# Patient Record
Sex: Female | Born: 1960 | Race: White | Hispanic: No | Smoking: Former smoker
Health system: Southern US, Community
[De-identification: ages and names within clinical notes are randomized; demographics above are authoritative.]

## PROBLEM LIST (undated history)

## (undated) DIAGNOSIS — I671 Cerebral aneurysm, nonruptured: Secondary | ICD-10-CM

## (undated) DIAGNOSIS — I10 Essential (primary) hypertension: Secondary | ICD-10-CM

## (undated) DIAGNOSIS — IMO0002 Reserved for concepts with insufficient information to code with codable children: Secondary | ICD-10-CM

## (undated) DIAGNOSIS — K589 Irritable bowel syndrome without diarrhea: Secondary | ICD-10-CM

## (undated) DIAGNOSIS — Z8719 Personal history of other diseases of the digestive system: Secondary | ICD-10-CM

## (undated) DIAGNOSIS — K219 Gastro-esophageal reflux disease without esophagitis: Secondary | ICD-10-CM

## (undated) DIAGNOSIS — D699 Hemorrhagic condition, unspecified: Secondary | ICD-10-CM

## (undated) DIAGNOSIS — J349 Unspecified disorder of nose and nasal sinuses: Secondary | ICD-10-CM

## (undated) DIAGNOSIS — E7800 Pure hypercholesterolemia, unspecified: Secondary | ICD-10-CM

## (undated) HISTORY — DX: Essential (primary) hypertension: I10

## (undated) HISTORY — DX: Pure hypercholesterolemia, unspecified: E78.00

## (undated) HISTORY — PX: APPENDECTOMY: SHX54

## (undated) HISTORY — PX: SHOULDER ARTHROSCOPY: SHX128

## (undated) HISTORY — PX: REFRACTIVE SURGERY: SHX103

## (undated) HISTORY — PX: DIAGNOSTIC LAPAROSCOPY: SUR761

## (undated) HISTORY — DX: Cerebral aneurysm, nonruptured: I67.1

## (undated) HISTORY — PX: AUGMENTATION MAMMAPLASTY: SUR837

## (undated) HISTORY — PX: BREAST ENHANCEMENT SURGERY: SHX7

---

## 1997-11-14 DIAGNOSIS — I671 Cerebral aneurysm, nonruptured: Secondary | ICD-10-CM

## 1997-11-14 HISTORY — PX: CEREBRAL ANEURYSM REPAIR: SHX164

## 1997-11-14 HISTORY — DX: Cerebral aneurysm, nonruptured: I67.1

## 2003-11-15 HISTORY — PX: ABDOMINAL HYSTERECTOMY: SHX81

## 2009-01-21 ENCOUNTER — Emergency Department (HOSPITAL_COMMUNITY): Admission: EM | Admit: 2009-01-21 | Discharge: 2009-01-21 | Payer: Self-pay | Admitting: Family Medicine

## 2009-09-25 ENCOUNTER — Ambulatory Visit (HOSPITAL_COMMUNITY): Admission: RE | Admit: 2009-09-25 | Discharge: 2009-09-25 | Payer: Self-pay | Admitting: Internal Medicine

## 2010-02-24 ENCOUNTER — Encounter: Admission: RE | Admit: 2010-02-24 | Discharge: 2010-05-25 | Payer: Self-pay | Admitting: Neurological Surgery

## 2010-04-20 ENCOUNTER — Emergency Department (HOSPITAL_COMMUNITY): Admission: EM | Admit: 2010-04-20 | Discharge: 2010-04-20 | Payer: Self-pay | Admitting: Family Medicine

## 2010-06-03 ENCOUNTER — Inpatient Hospital Stay (HOSPITAL_COMMUNITY): Admission: RE | Admit: 2010-06-03 | Discharge: 2010-06-06 | Payer: Self-pay | Admitting: Neurological Surgery

## 2010-06-03 HISTORY — PX: LUMBAR DISC SURGERY: SHX700

## 2010-10-18 ENCOUNTER — Emergency Department (HOSPITAL_COMMUNITY)
Admission: EM | Admit: 2010-10-18 | Discharge: 2010-10-18 | Payer: Self-pay | Source: Home / Self Care | Admitting: Family Medicine

## 2011-01-25 LAB — POCT URINALYSIS DIPSTICK: Nitrite: POSITIVE — AB

## 2011-01-29 LAB — ABO/RH: ABO/RH(D): O POS

## 2011-01-29 LAB — BASIC METABOLIC PANEL
BUN: 16 mg/dL (ref 6–23)
Calcium: 9.9 mg/dL (ref 8.4–10.5)
Chloride: 100 mEq/L (ref 96–112)
GFR calc non Af Amer: >60 mL/min (ref >60)
Glucose, Bld: 96 mg/dL (ref 70–99)
Potassium: 4.6 mEq/L (ref 3.5–5.1)

## 2011-01-29 LAB — CBC
HCT: 38.7 % (ref 36.0–46.0)
Hemoglobin: 13.2 g/dL (ref 12.0–15.0)
RBC: 4.01 MIL/uL (ref 3.87–5.11)
RDW: 12.9 % (ref 11.5–15.5)
WBC: 5.8 10*3/uL (ref 4.0–10.5)

## 2011-01-29 LAB — TYPE AND SCREEN

## 2011-02-24 LAB — POCT URINALYSIS DIP (DEVICE)
Glucose, UA: NEGATIVE mg/dL
Ketones, ur: NEGATIVE mg/dL
Nitrite: POSITIVE — AB
Specific Gravity, Urine: 1.02 (ref 1.005–1.030)

## 2011-07-07 IMAGING — RF DG LUMBAR SPINE 2-3V
1 series · 2 of 2 positions shown · non-contrast
Comparison: MRI lumbar spine 09/25/2009

CLINICAL DATA: Back pain

LUMBAR SPINE - 2-3 VIEW

[Series 1: run · 2 of 2 slices shown]
[im 1/2]
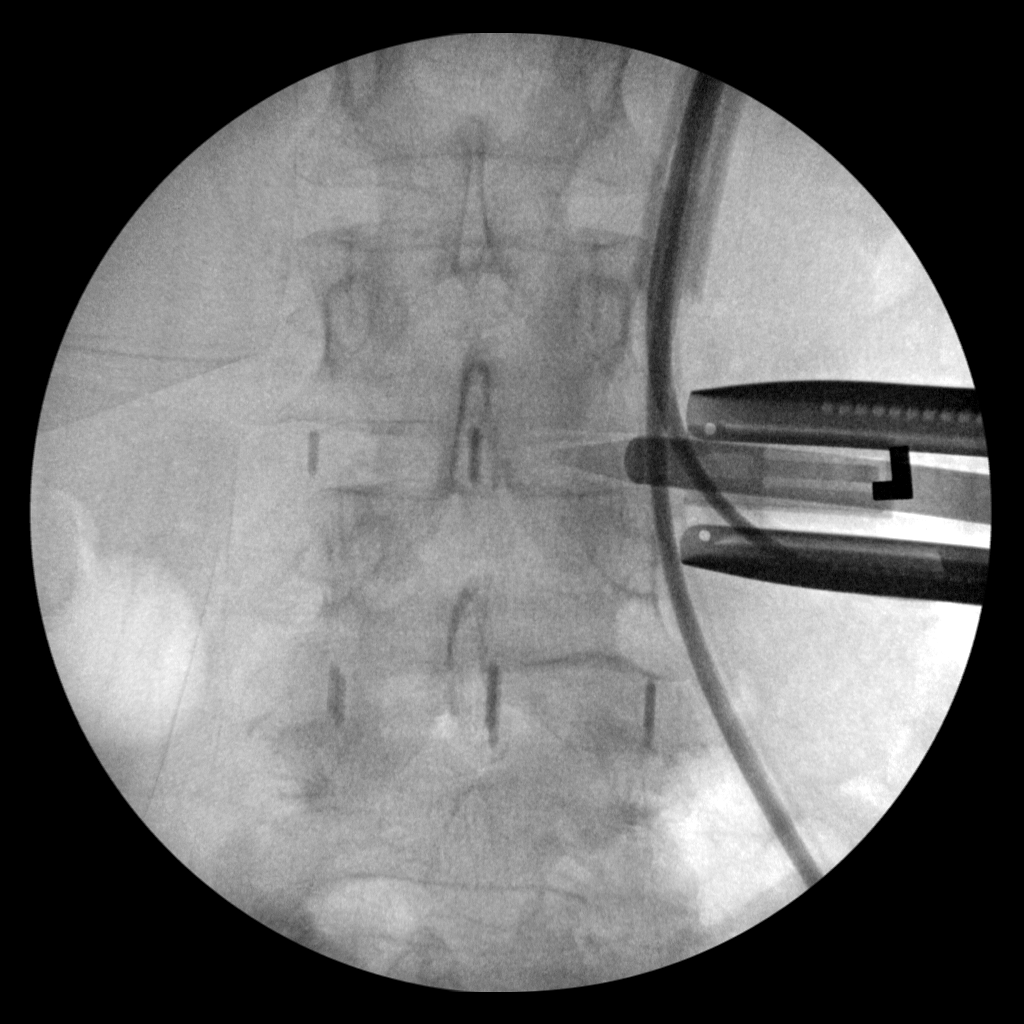
[im 2/2]
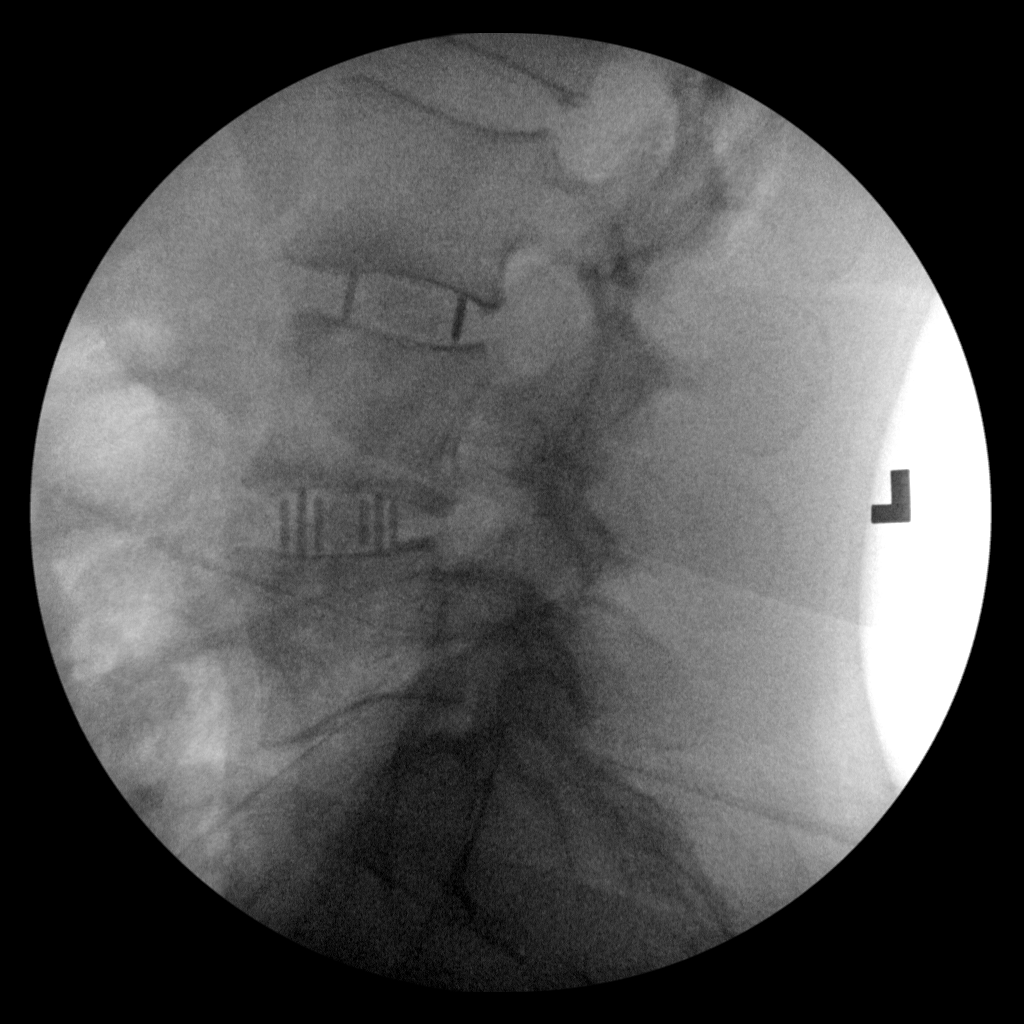

[2 of 2 positions shown; findings below may reference images not displayed]

FINDINGS: Intraoperative C-arm films document performance of XLIF
from a left lateral approach at L3-4 and L4-5.  Alignment appears
anatomic.
IMPRESSION: As above.

## 2011-11-15 HISTORY — PX: NASAL SINUS SURGERY: SHX719

## 2012-01-10 ENCOUNTER — Encounter: Payer: Self-pay | Admitting: Cardiovascular Disease

## 2012-01-10 ENCOUNTER — Ambulatory Visit (INDEPENDENT_AMBULATORY_CARE_PROVIDER_SITE_OTHER): Payer: 59 | Admitting: Cardiovascular Disease

## 2012-01-10 VITALS — BP 112/82 | HR 69 | Ht 69.0 in | Wt 142.0 lb

## 2012-01-10 DIAGNOSIS — I10 Essential (primary) hypertension: Secondary | ICD-10-CM | POA: Insufficient documentation

## 2012-01-10 DIAGNOSIS — R0789 Other chest pain: Secondary | ICD-10-CM

## 2012-01-10 NOTE — Assessment & Plan Note (Signed)
The patient has episodic chest pressure concerning for angina. There are typical and atypical features present. It sounds like her cardiac enzymes were equivocal when she was hospitalized. She clearly warrants stress testing and a nuclear stress study will be scheduled. She cannot exercise on the treadmill because of her recent operation. She will be scheduled for a Lexiscan Myoview study. Her underlying cardiovascular risk factors include hypertension and hyperlipidemia.

## 2012-01-10 NOTE — Assessment & Plan Note (Signed)
Controlled on current medical therapy.

## 2012-01-10 NOTE — Patient Instructions (Signed)
Your physician has requested that you have a lexiscan myoview. For further information please visit [email or web address]. Please follow instruction sheet, as given.  Your physician recommends that you schedule a follow-up appointment as needed.   Your physician recommends that you continue on your current medications as directed. Please refer to the Current Medication list given to you today.

## 2012-01-10 NOTE — Progress Notes (Signed)
HPI:  This is a 51 year old woman presenting for her initial cardiac evaluation. 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  She underwent sinus surgery about 2 weeks ago initially did very well with surgery. Last week she had a heated argument with her 79 year old daughter who lives with her. She woke up the following morning at 4 AM with severe substernal chest pressure and the episode lasted about one and a half hours. This felt like a pressure and squeezing sensation in the substernal chest radiating into the upper chest. There were no associated symptoms of nausea, diaphoresis, or breathlessness. Symptoms resolved on their own. The following day she was explaining the argument to her mother then had a recurrence of chest tightness. This was less severe than the initial episode but again lasted several minutes. She subsequently developed acute epistaxis and had to undergo a second surgery for bleeding from her ethmoid sinus. Following the surgery when she was going from the PACU into her room she developed chest pain again. The symptoms felt similar to her previous episodes. Cardiac markers apparently showed an elevated CK-MB but her troponin was reportedly normal. Cardiology was contacted and they recommended close outpatient followup. The patient reported another episode of mild chest pain earlier today but this was much less severe than the previous episodes. She has no personal history of cardiac problems in the past.  Her past medical history is pertinent for hypercholesterolemia, hypertension, a cerebral aneurysm requiring coiling in the 1990s, and hysterectomy in 2005.   Outpatient Encounter Prescriptions as of 01/10/2012  Medication Sig Dispense Refill  . atenolol (TENORMIN) 50 MG tablet Take 50 mg by mouth at bedtime.      . B Complex Vitamins (VITAMIN-B COMPLEX PO) Take by mouth daily.      . Cinnamon 500 MG capsule Take 500 mg by mouth 2 (two) times daily.      .  ferrous fumarate (HEMOCYTE - 106 MG FE) 325 (106 FE) MG TABS Take 1 tablet by mouth daily.      Marland Kitchen glycopyrrolate (ROBINUL) 2 MG tablet Take 2 mg by mouth 2 (two) times daily.      . hydrochlorothiazide (HYDRODIURIL) 25 MG tablet Take 25 mg by mouth daily.      Marland Kitchen KRILL OIL PO Take by mouth daily.      . Multiple Vitamins-Minerals (CENTRUM CARDIO PO) Take by mouth 2 (two) times daily.      . pantoprazole (PROTONIX) 40 MG tablet Take 40 mg by mouth daily.      . Probiotic Product (CVS PROBIOTIC PO) Take by mouth daily.      . quinapril (ACCUPRIL) 40 MG tablet Take 40 mg by mouth daily.        Morphine and related and Tetracyclines & related  Past Medical History  Diagnosis Date  . Cerebral aneurysm 1999    Surgically coiled  . Hypertension   . Hypercholesterolemia     Past Surgical History  Procedure Date  . Abdominal hysterectomy  2005  . Nasal sinus surgery 2013    History   Social History  . Marital Status: Divorced    Spouse Name: N/A    Number of Children: N/A  . Years of Education: N/A   Occupational History  . Not on file.   Social History Main Topics  . Smoking status: Former Smoker    Quit date: 11/14/1984  . Smokeless tobacco: Not on file  . Alcohol Use: Not on file  .  Drug Use: Not on file  . Sexually Active: Not on file   Other Topics Concern  . Not on file   Social History Narrative   The patient works as a Marine scientist in the electrophysiology department. Her daughter and 14-monthold granddaughter live with her. She does not smoke cigarettes.    Family History  Problem Relation Age of Onset  . Hypertension Father   . Hypertension Mother     ROS: General: no fevers/chills/night sweats Eyes: no blurry vision, diplopia, or amaurosis ENT: Positive for pain behind her left ear, eye, and jaw area Resp: no cough, wheezing, or hemoptysis CV: no edema or palpitations GI: no abdominal pain, vomiting, diarrhea, or constipation. Positive for nausea GU: no  dysuria, frequency, or hematuria Skin: no rash Neuro: no headache, numbness, tingling, or weakness of extremities Musculoskeletal: no joint pain or swelling Heme: no bleeding, DVT, or easy bruising Endo: no polydipsia or polyuria  BP 112/82  Pulse 69  Ht 5' 9"  (1.753 m)  Wt 64.411 kg (142 lb)  BMI 20.97 kg/m2  PHYSICAL EXAM: Pt is alert and oriented, WD, WN, in no distress. HEENT: normal Neck: JVP normal. Carotid upstrokes normal without bruits. No thyromegaly. Lungs: equal expansion, clear bilaterally CV: Apex is discrete and nondisplaced, RRR without murmur or gallop Abd: soft, NT, +BS, no bruit, no hepatosplenomegaly Back: no CVA tenderness Ext: no C/C/E        Femoral pulses 2+= without bruits        DP/PT pulses intact and = Skin: warm and dry without rash Neuro: CNII-XII intact             Strength intact = bilaterally  EKG:  Normal sinus rhythm with nonspecific ST abnormality and incomplete right bundle branch block.  ASSESSMENT AND PLAN:

## 2012-01-11 ENCOUNTER — Ambulatory Visit (HOSPITAL_COMMUNITY): Payer: 59 | Attending: Cardiology | Admitting: Radiology

## 2012-01-11 VITALS — BP 99/61 | Ht 69.0 in | Wt 139.0 lb

## 2012-01-11 DIAGNOSIS — Z87891 Personal history of nicotine dependence: Secondary | ICD-10-CM | POA: Insufficient documentation

## 2012-01-11 DIAGNOSIS — R110 Nausea: Secondary | ICD-10-CM | POA: Insufficient documentation

## 2012-01-11 DIAGNOSIS — R079 Chest pain, unspecified: Secondary | ICD-10-CM | POA: Insufficient documentation

## 2012-01-11 DIAGNOSIS — R0789 Other chest pain: Secondary | ICD-10-CM

## 2012-01-11 DIAGNOSIS — E785 Hyperlipidemia, unspecified: Secondary | ICD-10-CM | POA: Insufficient documentation

## 2012-01-11 DIAGNOSIS — I10 Essential (primary) hypertension: Secondary | ICD-10-CM | POA: Insufficient documentation

## 2012-01-11 MED ORDER — TECHNETIUM TC 99M TETROFOSMIN IV KIT
10.0000 | PACK | Freq: Once | INTRAVENOUS | Status: AC | PRN
  Administered 2012-01-11: 10 via INTRAVENOUS

## 2012-01-11 MED ORDER — REGADENOSON 0.4 MG/5ML IV SOLN
0.4000 mg | Freq: Once | INTRAVENOUS | Status: AC
  Administered 2012-01-11: 0.4 mg via INTRAVENOUS

## 2012-01-11 MED ORDER — TECHNETIUM TC 99M TETROFOSMIN IV KIT
30.0000 | PACK | Freq: Once | INTRAVENOUS | Status: AC | PRN
  Administered 2012-01-11: 30 via INTRAVENOUS

## 2012-01-11 NOTE — Progress Notes (Signed)
[facility] NUCLEAR MED [place] [address] [phone]  Cardiology Nuclear Med Study  [name] [name] is a 51 y.o. female [id number] [birth year]   Nuclear Med Background Indication for Stress Test:  Evaluation for Ischemia and [facility]: Chest pain after sinus surgery> ^CK-MB, NL Tropoin History:  GXT: [facility] Cardiology Cardiac Risk Factors: History of Smoking, Hypertension and Lipids  Symptoms:  Chest Pain, Chest Pressure, and Nausea   Nuclear Pre-Procedure Caffeine/Decaff Intake:  None NPO After: 7:00pm   Lungs:  clear IV 0.9% NS with Angio Cath:  20g  IV Site: R Antecubital  IV Started by:  Eliezer Lofts, EMT-P  Chest Size (in):  36 Cup Size: D  Height: 5' 9"  (1.753 m)  Weight:  139 lb (63.05 kg)  BMI:  Body mass index is 20.53 kg/(m^2). Tech Comments:  NA    Nuclear Med Study 1 or 2 day study: 1 day  Stress Test Type:  Treadmill/Lexiscan  Reading MD: Kirk Ruths, MD  Order Authorizing Provider:  M.Cooper  Resting Radionuclide: Technetium 56mTetrofosmin  Resting Radionuclide Dose: 10.8 mCi   Stress Radionuclide:  Technetium 953metrofosmin  Stress Radionuclide Dose: 32.7 mCi           Stress Protocol Rest HR: 88 Stress HR: 121  Rest BP: 99/61 Stress BP: 112/67  Exercise Time (min): n/a METS: n/a   Predicted Max HR: 168 bpm % Max HR: 72.02 bpm Rate Pressure Product: 13552   Dose of Adenosine (mg):  n/a Dose of Lexiscan: 0.4 mg  Dose of Atropine (mg): n/a Dose of Dobutamine: n/a mcg/kg/min (at max HR)  Stress Test Technologist: SaPerrin MalteseEMT-P  Nuclear Technologist:  ToAnnye RuskCNMT     Rest Procedure:  Myocardial perfusion imaging was performed at rest 45 minutes following the intravenous administration of Technetium 9961mtrofosmin. Rest ECG: NSR with non-specific ST-T wave changes  Stress Procedure:  The patient received IV Lexiscan 0.4 mg over 15-seconds with concurrent low level exercise and then  Technetium 28m60mrofosmin was injected at 30-seconds while the patient continued walking one more minute.  There were non specific changes, + sob, chest tightness, and lt. headed with Lexiscan.  Quantitative spect images were obtained after a 45-minute delay. Stress ECG: No significant ST segment change suggestive of ischemia.  QPS Raw Data Images:  Acquisition technically good; normal left ventricular size. Stress Images:  Normal homogeneous uptake in all areas of the myocardium. Rest Images:  Normal homogeneous uptake in all areas of the myocardium. Subtraction (SDS):  No evidence of ischemia. Transient Ischemic Dilatation (Normal <1.22):  1.01 Lung/Heart Ratio (Normal <0.45):  0.32  Quantitative Gated Spect Images QGS EDV:  62 ml QGS ESV:  13 ml QGS cine images:  NL LV Function; NL Wall Motion QGS EF: 80%  Impression Exercise Capacity:  Lexiscan with no exercise. BP Response:  Normal blood pressure response. Clinical Symptoms:  There is chest tightness. ECG Impression:  No significant ST segment change suggestive of ischemia. Comparison with Prior Nuclear Study: No previous nuclear study performed  Overall Impression:  Normal stress nuclear study.  BriaKirk Ruths

## 2012-02-27 ENCOUNTER — Encounter (HOSPITAL_BASED_OUTPATIENT_CLINIC_OR_DEPARTMENT_OTHER): Payer: Self-pay | Admitting: *Deleted

## 2012-02-27 NOTE — Progress Notes (Signed)
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

## 2012-03-01 ENCOUNTER — Ambulatory Visit (HOSPITAL_COMMUNITY): Payer: 59

## 2012-03-01 ENCOUNTER — Encounter (HOSPITAL_BASED_OUTPATIENT_CLINIC_OR_DEPARTMENT_OTHER): Payer: Self-pay

## 2012-03-01 ENCOUNTER — Ambulatory Visit (HOSPITAL_BASED_OUTPATIENT_CLINIC_OR_DEPARTMENT_OTHER): Payer: 59 | Admitting: *Deleted

## 2012-03-01 ENCOUNTER — Ambulatory Visit (HOSPITAL_BASED_OUTPATIENT_CLINIC_OR_DEPARTMENT_OTHER)
Admission: RE | Admit: 2012-03-01 | Discharge: 2012-03-01 | Disposition: A | Discharge status: Home / Self Care | Payer: 59 | Source: Ambulatory Visit | Attending: Orthopedic Surgery | Admitting: Orthopedic Surgery

## 2012-03-01 ENCOUNTER — Encounter (HOSPITAL_BASED_OUTPATIENT_CLINIC_OR_DEPARTMENT_OTHER): Payer: Self-pay | Admitting: *Deleted

## 2012-03-01 ENCOUNTER — Encounter (HOSPITAL_BASED_OUTPATIENT_CLINIC_OR_DEPARTMENT_OTHER)
Admission: RE | Disposition: A | Discharge status: Home / Self Care | Payer: Self-pay | Source: Ambulatory Visit | Attending: Orthopedic Surgery

## 2012-03-01 DIAGNOSIS — IMO0001 Reserved for inherently not codable concepts without codable children: Secondary | ICD-10-CM

## 2012-03-01 DIAGNOSIS — W108XXA Fall (on) (from) other stairs and steps, initial encounter: Secondary | ICD-10-CM | POA: Insufficient documentation

## 2012-03-01 DIAGNOSIS — E7800 Pure hypercholesterolemia, unspecified: Secondary | ICD-10-CM | POA: Insufficient documentation

## 2012-03-01 DIAGNOSIS — S92253A Displaced fracture of navicular [scaphoid] of unspecified foot, initial encounter for closed fracture: Secondary | ICD-10-CM | POA: Insufficient documentation

## 2012-03-01 DIAGNOSIS — I10 Essential (primary) hypertension: Secondary | ICD-10-CM | POA: Insufficient documentation

## 2012-03-01 DIAGNOSIS — K219 Gastro-esophageal reflux disease without esophagitis: Secondary | ICD-10-CM | POA: Insufficient documentation

## 2012-03-01 HISTORY — DX: Unspecified disorder of nose and nasal sinuses: J34.9

## 2012-03-01 HISTORY — DX: Reserved for concepts with insufficient information to code with codable children: IMO0002

## 2012-03-01 HISTORY — DX: Gastro-esophageal reflux disease without esophagitis: K21.9

## 2012-03-01 HISTORY — PX: ORIF ANKLE FRACTURE: SHX5408

## 2012-03-01 LAB — POCT I-STAT, CHEM 8
Calcium, Ion: 1.17 mmol/L (ref 1.12–1.32)
Creatinine, Ser: 0.6 mg/dL (ref 0.50–1.10)
Glucose, Bld: 104 mg/dL — ABNORMAL HIGH (ref 70–99)
Hemoglobin: 12.2 g/dL (ref 12.0–15.0)
Sodium: 140 mEq/L (ref 135–145)
TCO2: 29 mmol/L (ref 0–100)

## 2012-03-01 SURGERY — OPEN REDUCTION INTERNAL FIXATION (ORIF) ANKLE FRACTURE
Anesthesia: General | Site: Foot | Laterality: Right | Wound class: Clean

## 2012-03-01 MED ORDER — FENTANYL CITRATE 0.05 MG/ML IJ SOLN
25.0000 ug | INTRAMUSCULAR | Status: DC | PRN
  Administered 2012-03-01 (×2): 25 ug via INTRAVENOUS

## 2012-03-01 MED ORDER — OXYCODONE HCL 5 MG PO TABS
5.0000 mg | ORAL_TABLET | Freq: Once | ORAL | Status: AC
  Administered 2012-03-01: 5 mg via ORAL

## 2012-03-01 MED ORDER — MIDAZOLAM HCL 2 MG/2ML IJ SOLN
0.5000 mg | INTRAMUSCULAR | Status: DC | PRN
  Administered 2012-03-01 (×2): 2 mg via INTRAVENOUS

## 2012-03-01 MED ORDER — LIDOCAINE HCL 1 % IJ SOLN
INTRAMUSCULAR | Status: DC | PRN
Start: 2012-03-01 — End: 2012-03-01
  Administered 2012-03-01: 2 mL via INTRADERMAL

## 2012-03-01 MED ORDER — METOCLOPRAMIDE HCL 5 MG/ML IJ SOLN
INTRAMUSCULAR | Status: DC | PRN
  Administered 2012-03-01: 10 mg via INTRAVENOUS

## 2012-03-01 MED ORDER — ONDANSETRON HCL 4 MG/2ML IJ SOLN
INTRAMUSCULAR | Status: DC | PRN
  Administered 2012-03-01: 4 mg via INTRAVENOUS

## 2012-03-01 MED ORDER — METOCLOPRAMIDE HCL 5 MG/ML IJ SOLN: 10.0000 mg | Freq: Once | INTRAMUSCULAR | Status: DC | PRN

## 2012-03-01 MED ORDER — CHLORHEXIDINE GLUCONATE 4 % EX LIQD: 60.0000 mL | Freq: Once | CUTANEOUS | Status: DC

## 2012-03-01 MED ORDER — PROPOFOL 10 MG/ML IV EMUL
INTRAVENOUS | Status: DC | PRN
  Administered 2012-03-01: 250 mg via INTRAVENOUS

## 2012-03-01 MED ORDER — LACTATED RINGERS IV SOLN
INTRAVENOUS | Status: DC
  Administered 2012-03-01 (×2): via INTRAVENOUS

## 2012-03-01 MED ORDER — LIDOCAINE-EPINEPHRINE 1.5-1:200000 % IJ SOLN
INTRAMUSCULAR | Status: DC | PRN
  Administered 2012-03-01: 25 mL via INTRADERMAL

## 2012-03-01 MED ORDER — DEXAMETHASONE SODIUM PHOSPHATE 10 MG/ML IJ SOLN
INTRAMUSCULAR | Status: DC | PRN
  Administered 2012-03-01: 10 mg via INTRAVENOUS

## 2012-03-01 MED ORDER — 0.9 % SODIUM CHLORIDE (POUR BTL) OPTIME
TOPICAL | Status: DC | PRN
  Administered 2012-03-01: 1000 mL

## 2012-03-01 MED ORDER — OXYCODONE HCL 5 MG PO TABS: 5.0000 mg | ORAL_TABLET | ORAL | Status: AC | PRN

## 2012-03-01 MED ORDER — CEFAZOLIN SODIUM-DEXTROSE 2-3 GM-% IV SOLR
2.0000 g | INTRAVENOUS | Status: AC
  Administered 2012-03-01: 2 g via INTRAVENOUS

## 2012-03-01 MED ORDER — ROPIVACAINE HCL 5 MG/ML IJ SOLN
INTRAMUSCULAR | Status: DC | PRN
  Administered 2012-03-01: 25 mL via EPIDURAL

## 2012-03-01 MED ORDER — FENTANYL CITRATE 0.05 MG/ML IJ SOLN
50.0000 ug | INTRAMUSCULAR | Status: DC | PRN
  Administered 2012-03-01: 100 ug via INTRAVENOUS

## 2012-03-01 SURGICAL SUPPLY — 65 items
BAG DECANTER FOR FLEXI CONT (MISCELLANEOUS) IMPLANT
BANDAGE ESMARK 6X9 LF (GAUZE/BANDAGES/DRESSINGS) ×1 IMPLANT
BIT DRILL 2 FAST STEP (BIT) ×2 IMPLANT
BLADE SURG 15 STRL LF DISP TIS (BLADE) ×2 IMPLANT
BLADE SURG 15 STRL SS (BLADE) ×2
BNDG COHESIVE 4X5 TAN STRL (GAUZE/BANDAGES/DRESSINGS) ×2 IMPLANT
BNDG COHESIVE 6X5 TAN STRL LF (GAUZE/BANDAGES/DRESSINGS) ×2 IMPLANT
BNDG ESMARK 6X9 LF (GAUZE/BANDAGES/DRESSINGS) ×2
CHLORAPREP W/TINT 26ML (MISCELLANEOUS) ×2 IMPLANT
CLOTH BEACON ORANGE TIMEOUT ST (SAFETY) ×2 IMPLANT
COVER TABLE BACK 60X90 (DRAPES) ×2 IMPLANT
CUFF TOURNIQUET SINGLE 34IN LL (TOURNIQUET CUFF) ×2 IMPLANT
DECANTER SPIKE VIAL GLASS SM (MISCELLANEOUS) IMPLANT
DRAPE C-ARM 42X72 X-RAY (DRAPES) ×2 IMPLANT
DRAPE EXTREMITY T 121X128X90 (DRAPE) ×2 IMPLANT
DRAPE INCISE IOBAN 66X45 STRL (DRAPES) ×2 IMPLANT
DRAPE U-SHAPE 47X51 STRL (DRAPES) ×2 IMPLANT
DRAPE U-SHAPE 76X120 STRL (DRAPES) ×2 IMPLANT
DRESSING ADAPTIC 1/2  N-ADH (PACKING) IMPLANT
DRSG EMULSION OIL 3X3 NADH (GAUZE/BANDAGES/DRESSINGS) ×2 IMPLANT
DRSG PAD ABDOMINAL 8X10 ST (GAUZE/BANDAGES/DRESSINGS) ×4 IMPLANT
ELECT REM PT RETURN 9FT ADLT (ELECTROSURGICAL) ×2
ELECTRODE REM PT RTRN 9FT ADLT (ELECTROSURGICAL) ×1 IMPLANT
GLOVE BIO SURGEON STRL SZ8 (GLOVE) ×2 IMPLANT
GLOVE BIOGEL PI IND STRL 8 (GLOVE) ×1 IMPLANT
GLOVE BIOGEL PI INDICATOR 8 (GLOVE) ×1
GLOVE ECLIPSE 6.5 STRL STRAW (GLOVE) ×2 IMPLANT
GOWN PREVENTION PLUS XLARGE (GOWN DISPOSABLE) ×2 IMPLANT
GOWN PREVENTION PLUS XXLARGE (GOWN DISPOSABLE) ×2 IMPLANT
K-WIRE ACE 1.6X6 (WIRE) ×4
KWIRE ACE 1.6X6 (WIRE) ×2 IMPLANT
NEEDLE HYPO 22GX1.5 SAFETY (NEEDLE) IMPLANT
PACK BASIN DAY SURGERY FS (CUSTOM PROCEDURE TRAY) ×2 IMPLANT
PAD CAST 4YDX4 CTTN HI CHSV (CAST SUPPLIES) ×1 IMPLANT
PADDING CAST COTTON 4X4 STRL (CAST SUPPLIES) ×1
PADDING CAST COTTON 6X4 STRL (CAST SUPPLIES) ×2 IMPLANT
PENCIL BUTTON HOLSTER BLD 10FT (ELECTRODE) ×2 IMPLANT
PLATE LOCK SM RT NAVICULAR FT (Plate) ×2 IMPLANT
SCREW CANC NONLOCK 2.5X26 (Screw) ×2 IMPLANT
SCREW DRIVER TIP-BIOMET ×2 IMPLANT
SCREW PEG 2.5X14 NONLOCK (Screw) ×4 IMPLANT
SCREW PEG 2.5X20 NONLOCK (Screw) ×2 IMPLANT
SCREW PEG 2.5X22 NONLOCK (Screw) ×2 IMPLANT
SCREW PEG 2.5X24 NONLOCK (Screw) ×6 IMPLANT
SHEET MEDIUM DRAPE 40X70 STRL (DRAPES) ×2 IMPLANT
SPLINT FAST PLASTER 5X30 (CAST SUPPLIES) ×20
SPLINT PLASTER CAST FAST 5X30 (CAST SUPPLIES) ×20 IMPLANT
SPONGE GAUZE 4X4 12PLY (GAUZE/BANDAGES/DRESSINGS) ×2 IMPLANT
SPONGE LAP 18X18 X RAY DECT (DISPOSABLE) ×2 IMPLANT
STAPLER VISISTAT 35W (STAPLE) IMPLANT
STOCKINETTE 6  STRL (DRAPES) ×1
STOCKINETTE 6 STRL (DRAPES) ×1 IMPLANT
STRIP CLOSURE SKIN 1/2X4 (GAUZE/BANDAGES/DRESSINGS) IMPLANT
SUCTION FRAZIER TIP 10 FR DISP (SUCTIONS) IMPLANT
SUT MNCRL AB 3-0 PS2 18 (SUTURE) ×2 IMPLANT
SUT PROLENE 3 0 PS 2 (SUTURE) ×2 IMPLANT
SUT VIC AB 0 SH 27 (SUTURE) ×2 IMPLANT
SUT VIC AB 2-0 SH 27 (SUTURE)
SUT VIC AB 2-0 SH 27XBRD (SUTURE) IMPLANT
SUT VICRYL 4-0 PS2 18IN ABS (SUTURE) IMPLANT
SYR BULB 3OZ (MISCELLANEOUS) ×2 IMPLANT
SYR CONTROL 10ML LL (SYRINGE) IMPLANT
TUBE CONNECTING 20X1/4 (TUBING) IMPLANT
UNDERPAD 30X30 INCONTINENT (UNDERPADS AND DIAPERS) ×2 IMPLANT
WATER STERILE IRR 1000ML POUR (IV SOLUTION) IMPLANT

## 2012-03-01 NOTE — Progress Notes (Signed)
Assisted Dr. Albertina Parr with right, ultrasound guided, popliteal/saphenous block. Side rails up, monitors on throughout procedure. See vital signs in flow sheet. Tolerated Procedure well.

## 2012-03-01 NOTE — Op Note (Signed)
03/01/2012  11:06 AM  PATIENT:  [name] [name]  51 y.o. female  PRE-OPERATIVE DIAGNOSIS:  Right navicular fracture  POST-OPERATIVE DIAGNOSIS:  Right navicular fracture  Procedure(s): OPEN REDUCTION INTERNAL FIXATION (ORIF) ANKLE FRACTURE 2.  Fluoro  SURGEON:  Wylene Simmer, MD  ASSISTANT: n/a  ANESTHESIA:   General, regional  EBL:  minimal   TOURNIQUET:   Total Tourniquet Time Documented: Thigh (Right) - 71 minutes  COMPLICATIONS:  None apparent  DISPOSITION:  Extubated, awake and stable to recovery.  DICTATION ID:  [id number]

## 2012-03-01 NOTE — Anesthesia Postprocedure Evaluation (Signed)
Anesthesia Post Note  Patient: [name] [name]  Procedure(s) Performed: Procedure(s) (LRB): OPEN REDUCTION INTERNAL FIXATION (ORIF) ANKLE FRACTURE (Right)  Anesthesia type: General  Patient location: PACU  Post pain: Pain level controlled  Post assessment: Patient's Cardiovascular Status Stable  Last Vitals:  Filed Vitals:   03/01/12 1230  BP: 94/57  Pulse: 58  Temp:   Resp: 12    Post vital signs: Reviewed and stable  Level of consciousness: alert  Complications: No apparent anesthesia complications

## 2012-03-01 NOTE — Transfer of Care (Signed)
Immediate Anesthesia Transfer of Care Note  Patient: [name] [name]  Procedure(s) Performed: Procedure(s) (LRB): OPEN REDUCTION INTERNAL FIXATION (ORIF) ANKLE FRACTURE (Right)  Patient Location: PACU  Anesthesia Type: GA combined with regional for post-op pain  Level of Consciousness: awake, alert  and oriented  Airway & Oxygen Therapy: Patient Spontanous Breathing and Patient connected to face mask oxygen  Post-op Assessment: Report given to PACU RN, Post -op Vital signs reviewed and stable and Patient moving all extremities  Post vital signs: Reviewed and stable  Complications: No apparent anesthesia complications

## 2012-03-01 NOTE — H&P (Signed)
[name] [name] is an 51 y.o. female.   Chief Complaint: navicular fracture HPI: 51 y/o female with right ankle sprain and navicular fracture after recent fall.  She presents now for ORIF of this displaced intra-articular injury.  She has no h/o diabetes, recent smoking or thyroid problems.  Past Medical History  Diagnosis Date  . Cerebral aneurysm 1999    Surgically coiled  . Hypertension   . Hypercholesterolemia   . Sinus disease   . GERD (gastroesophageal reflux disease)   . DDD (degenerative disc disease)     Past Surgical History  Procedure Date  . Nasal sinus surgery 2013  . Back surgery 2010    scoliosis-lumbar  . Back surgery 2010    disc fusion  . Appendectomy   . Cerebral aneurysm repair 1999    coiled  . Eye surgery     lasik  . Shoulder arthroscopy     lt  . Abdominal hysterectomy  2005    abd  . Breast enhancement surgery   . Diagnostic laparoscopy     Family History  Problem Relation Age of Onset  . Hypertension Father   . Hypertension Mother    Social History:  reports that she quit smoking about 27 years ago. She does not have any smokeless tobacco history on file. Her alcohol and drug histories not on file.  Allergies:  Allergies  Allergen Reactions  . Morphine And Related     Nausea and vomitting  . Tetracyclines & Related     Break out on all extremties    No current facility-administered medications on file as of .   Medications Prior to Admission  Medication Sig Dispense Refill  . atenolol (TENORMIN) 50 MG tablet Take 50 mg by mouth at bedtime.      . B Complex Vitamins (VITAMIN-B COMPLEX PO) Take by mouth daily.      . Cinnamon 500 MG capsule Take 500 mg by mouth 2 (two) times daily.      . ferrous fumarate (HEMOCYTE - 106 MG FE) 325 (106 FE) MG TABS Take 1 tablet by mouth daily.      Marland Kitchen glycopyrrolate (ROBINUL) 2 MG tablet Take 2 mg by mouth 2 (two) times daily.      . hydrochlorothiazide (HYDRODIURIL) 25 MG tablet Take 25 mg by mouth  daily.      Marland Kitchen KRILL OIL PO Take by mouth daily.      . Multiple Vitamins-Minerals (CENTRUM CARDIO PO) Take by mouth 2 (two) times daily.      . pantoprazole (PROTONIX) 40 MG tablet Take 40 mg by mouth daily.      . Probiotic Product (CVS PROBIOTIC PO) Take by mouth daily.      . quinapril (ACCUPRIL) 40 MG tablet Take 40 mg by mouth daily.        No results found for this or any previous visit (from the past 48 hour(s)). No results found.  ROS  No recent f/c/n/v/wt loss.  There were no vitals taken for this visit. Physical Exam wn wd female in nad.  A and O x 4.  Mood and affect normal.  EOMI.  Respirations unlabored.  L foot with healthy skin.  Pulses palpable.  Normal sens to LT.  Active 5/5 strength inPF and DF of ankle.  No lymphadenopathy.  xrays show a displaced dorsal medial navicular fracture.  Assessment/Plan R foot displaced intra-articular navicular fracture - to OR for ORIF.  The risks and benefits of the alternative  treatment options have been discussed in detail.  The patient wishes to proceed with surgery and specifically understands risks of bleeding, infection, nerve damage, blood clots, need for additional surgery, amputation and death.   Wylene Simmer Mar 26, 2012, 7:16 AM

## 2012-03-01 NOTE — Discharge Instructions (Addendum)
Wylene Simmer, MD [place]  Please read the following information regarding your care after surgery.  Medications  You only need a prescription for the narcotic pain medicine (ex. oxycodone, Percocet, Norco).  All of the other medicines listed below are available over the counter. X acetominophen (Tylenol) 650 mg every 4-6 hours as you need for minor pain X oxycodone as prescribed for moderate to severe pain X over the counter benadryl as needed for itching.  Narcotic pain medicine (ex. oxycodone, Percocet, Vicodin) will cause constipation.  To prevent this problem, take the following medicines while you are taking any pain medicine. X docusate sodium (Colace) 100 mg twice a day X senna (Senokot) 2 tablets twice a day  X To help prevent blood clots, take an aspirin (325 mg) once a day for a month after surgery.  You should also get up every hour while you are awake to move around.    Weight Bearing ? Bear weight when you are able on your operated leg or foot. ? Bear weight only on the heel of your operated foot in the post-op shoe. X Do not bear any weight on the operated leg or foot.  Cast / Splint / Dressing X Keep your splint or cast clean and dry.  Don't put anything (coat hanger, pencil, etc) down inside of it.  If it gets damp, use a hair dryer on the cool setting to dry it.  If it gets soaked, call the office to schedule an appointment for a cast change. ? Remove your dressing 3 days after surgery and cover the incisions with dry dressings.    After your dressing, cast or splint is removed; you may shower, but do not soak or scrub the wound.  Allow the water to run over it, and then gently pat it dry.  Swelling It is normal for you to have swelling where you had surgery.  To reduce swelling and pain, keep your toes above your nose for at least 3 days after surgery.  It may be necessary to keep your foot or leg elevated for several weeks.  If it hurts, it should be  elevated.  Follow Up Call my office at [phone] when you are discharged from the hospital or surgery center to schedule an appointment to be seen two weeks after surgery.  Call my office at [phone] if you develop a fever >101.5 F, nausea, vomiting, bleeding from the surgical site or severe pain.     Post Anesthesia Home Care Instructions  Activity: Get plenty of rest for the remainder of the day. A responsible adult should stay with you for 24 hours following the procedure.  For the next 24 hours, DO NOT: -Drive a car -Paediatric nurse -Drink alcoholic beverages -Take any medication unless instructed by your physician -Make any legal decisions or sign important papers.  Meals: Start with liquid foods such as gelatin or soup. Progress to regular foods as tolerated. Avoid greasy, spicy, heavy foods. If nausea and/or vomiting occur, drink only clear liquids until the nausea and/or vomiting subsides. Call your physician if vomiting continues.  Special Instructions/Symptoms: Your throat may feel dry or sore from the anesthesia or the breathing tube placed in your throat during surgery. If this causes discomfort, gargle with warm salt water. The discomfort should disappear within 24 hours.    Regional Anesthesia Blocks  1. Numbness or the inability to move the "blocked" extremity may last from 3-48 hours after placement. The length of time depends on the  medication injected and your individual response to the medication. If the numbness is not going away after 48 hours, call your surgeon.  2. The extremity that is blocked will need to be protected until the numbness is gone and the  Strength has returned. Because you cannot feel it, you will need to take extra care to avoid injury. Because it may be weak, you may have difficulty moving it or using it. You may not know what position it is in without looking at it while the block is in effect.  3. For blocks in the legs and feet,  returning to weight bearing and walking needs to be done carefully. You will need to wait until the numbness is entirely gone and the strength has returned. You should be able to move your leg and foot normally before you try and bear weight or walk. You will need someone to be with you when you first try to ensure you do not fall and possibly risk injury.  4. Bruising and tenderness at the needle site are common side effects and will resolve in a few days.  5. Persistent numbness or new problems with movement should be communicated to the surgeon or the [facility] [phone] [place] [phone]).

## 2012-03-01 NOTE — Anesthesia Procedure Notes (Addendum)
Anesthesia Regional Block:  Popliteal block  Pre-Anesthetic Checklist: ,, timeout performed, Correct Patient, Correct Site, Correct Laterality, Correct Procedure, Correct Position, site marked, Risks and benefits discussed,  Surgical consent,  Pre-op evaluation,  At surgeon's request and post-op pain management  Laterality: Right  Prep: chloraprep       Needles:   Needle Type: Other   (Arrow Echogenic)   Needle Length: 9cm  Needle Gauge: 21    Additional Needles:  Procedures: ultrasound guided Popliteal block Narrative:  Start time: 03/01/2012 8:39 AM End time: 03/01/2012 8:48 AM Injection made incrementally with aspirations every 5 mL.  Performed by: Personally  Anesthesiologist: Nada Libman, MD  Additional Notes: Ultrasound guidance used to: id relevant anatomy, confirm needle position, local anesthetic spread, avoidance of vascular puncture. Picture saved. No complications. Block performed personally by Jessy Oto. Albertina Parr, MD  .    Popliteal block Procedure Name: LMA Insertion Date/Time: 03/01/2012 9:37 AM Performed by: [place] Pre-anesthesia Checklist: Patient identified, Emergency Drugs available, Suction available and Patient being monitored Patient Re-evaluated:Patient Re-evaluated prior to inductionOxygen Delivery Method: Circle System Utilized Preoxygenation: Pre-oxygenation with 100% oxygen Intubation Type: IV induction Ventilation: Mask ventilation without difficulty LMA: LMA inserted LMA Size: 4.0 Number of attempts: 1 Airway Equipment and Method: bite block Placement Confirmation: positive ETCO2 and breath sounds checked- equal and bilateral Tube secured with: Tape Dental Injury: Teeth and Oropharynx as per pre-operative assessment

## 2012-03-01 NOTE — Anesthesia Preprocedure Evaluation (Addendum)
Anesthesia Evaluation  Patient identified by MRN, date of birth, ID band Patient awake    Reviewed: Allergy & Precautions, H&P , NPO status , Patient's Chart, lab work & pertinent test results, reviewed documented beta blocker date and time   Airway Mallampati: II TM Distance: >3 FB Neck ROM: full    Dental   Pulmonary neg pulmonary ROS,          Cardiovascular hypertension, On Medications and On Home Beta Blockers     Neuro/Psych negative neurological ROS  negative psych ROS   GI/Hepatic Neg liver ROS, GERD-  Medicated and Controlled,  Endo/Other  negative endocrine ROS  Renal/GU negative Renal ROS  negative genitourinary   Musculoskeletal   Abdominal   Peds  Hematology negative hematology ROS (+)   Anesthesia Other Findings See surgeon's H&P   Reproductive/Obstetrics negative OB ROS                           Anesthesia Physical Anesthesia Plan  ASA: II  Anesthesia Plan: General   Post-op Pain Management:    Induction: Intravenous  Airway Management Planned: LMA  Additional Equipment:   Intra-op Plan:   Post-operative Plan: Extubation in OR  Informed Consent: I have reviewed the patients History and Physical, chart, labs and discussed the procedure including the risks, benefits and alternatives for the proposed anesthesia with the patient or authorized representative who has indicated his/her understanding and acceptance.     Plan Discussed with: CRNA and Surgeon  Anesthesia Plan Comments:         Anesthesia Quick Evaluation

## 2012-03-02 NOTE — Op Note (Signed)
NAME:  [name] [name]                      ACCOUNT NO.:  MEDICAL RECORD NO.:  [id number]  LOCATION:                                 FACILITY:  PHYSICIAN:  Wylene Simmer, MD        DATE OF BIRTH:  24-Sep-1961  DATE OF PROCEDURE:  03/01/2012 DATE OF DISCHARGE:                              OPERATIVE REPORT   PREOPERATIVE DIAGNOSIS:  Right navicular fracture.  POSTOPERATIVE DIAGNOSIS:  Right navicular fracture.  PROCEDURE: 1. Open reduction and internal fixation of right navicular fracture. 2. Intraoperative interpretation of fluoroscopic imaging.  SURGEON:  Wylene Simmer, M.D.  ANESTHESIA:  General, regional.  ESTIMATED BLOOD LOSS:  Minimal.  TOURNIQUET TIME:  71 minutes, at 225 mmHg.  COMPLICATIONS:  None apparent.  DISPOSITION:  Extubated, awake, and stable to recovery.  INDICATIONS FOR PROCEDURE:  The patient is a 51 year old woman who suffered an injury to her right ankle when she fell down some stairs at home.  X-rays revealed an intra-articular displaced fracture of the navicular.  She presents now for operative treatment of this injury. She understands risks and benefits, alternative treatment options and elects surgical treatment.  She specifically understands the risks of bleeding, infection, nerve damage, blood clots, need for additional surgery, amputation, and death.  PROCEDURE IN DETAIL:  After preoperative consent was obtained and the correct operative site was identified, the patient was brought to the operating room and placed supine on the operating table.  General anesthesia was induced.  Preoperative antibiotics were administered. Surgical time-out was taken.  Right lower extremity was prepped and draped in standard sterile fashion with a tourniquet around the thigh. The extremity was exsanguinated and tourniquet was inflated to 225 mmHg. A dorsal incision was made over the navicular.  The interval between the tibialis anterior and extensor hallus longus was  developed and the neurovascular bundle was identified and mobilized, it was retracted laterally and protected throughout the case.  The fracture site was identified.  The fracture was opened and cleaned of all hematoma.  The fracture was reduced and pinned into position.  AP and lateral views showed appropriate reduction of the fracture.  A Biomet dorsal navicular plate was selected and then applied to the dorsum of the navicular.  It was contoured to fit appropriately and it was secured with multiple nonlocking screws compressing the fracture site and pulling the plate securely down to the bone.  All of the unused FAST Guide's were removed. AP and lateral views of the foot showed appropriate reduction of the fracture, appropriate position and length of all hardware.  The wound was irrigated copiously.  The talonavicular joint capsule was closed over the joint with simple sutures of 0 Vicryl.  The extensor hallucis longus and extensor retinaculum was repaired with simple sutures of 0 Vicryl.  Subcutaneous tissue was closed with inverted simple sutures of 3-0 Monocryl.  Running 3-0 Prolene suture was used to close the skin incision.  Sterile dressings were applied, followed by a well-padded short-leg splint.  The patient was awakened from anesthesia and transported to the recovery room in stable condition.  FOLLOWUP PLAN:  The patient will be nonweightbearing  on her right foot. She will follow up with me in 2 weeks for suture removal and conversion to a cast.  She will begin aspirin twice a day for DVT prophylaxis.     Wylene Simmer, MD     JH/MEDQ  D:  03/01/2012  T:  03/01/2012  Job:  [id number]

## 2012-03-07 ENCOUNTER — Encounter (HOSPITAL_BASED_OUTPATIENT_CLINIC_OR_DEPARTMENT_OTHER): Payer: Self-pay | Admitting: Orthopedic Surgery

## 2012-03-08 ENCOUNTER — Other Ambulatory Visit: Payer: Self-pay | Admitting: Obstetrics and Gynecology

## 2012-03-08 DIAGNOSIS — N649 Disorder of breast, unspecified: Secondary | ICD-10-CM

## 2012-05-24 ENCOUNTER — Other Ambulatory Visit: Payer: 59

## 2013-04-22 ENCOUNTER — Other Ambulatory Visit: Payer: Self-pay | Admitting: Internal Medicine

## 2013-04-22 DIAGNOSIS — Z87898 Personal history of other specified conditions: Secondary | ICD-10-CM

## 2013-05-14 ENCOUNTER — Ambulatory Visit
Admission: RE | Admit: 2013-05-14 | Discharge: 2013-05-14 | Disposition: A | Discharge status: Home / Self Care | Payer: 59 | Source: Ambulatory Visit | Attending: Internal Medicine | Admitting: Internal Medicine

## 2013-05-14 DIAGNOSIS — Z87898 Personal history of other specified conditions: Secondary | ICD-10-CM

## 2014-05-22 ENCOUNTER — Other Ambulatory Visit: Payer: Self-pay

## 2014-05-22 DIAGNOSIS — Z1231 Encounter for screening mammogram for malignant neoplasm of breast: Secondary | ICD-10-CM

## 2014-06-04 ENCOUNTER — Ambulatory Visit
Admission: RE | Admit: 2014-06-04 | Discharge: 2014-06-04 | Disposition: A | Discharge status: Home / Self Care | Payer: 59 | Source: Ambulatory Visit

## 2014-06-04 ENCOUNTER — Encounter (INDEPENDENT_AMBULATORY_CARE_PROVIDER_SITE_OTHER): Payer: Self-pay

## 2014-06-04 ENCOUNTER — Other Ambulatory Visit: Payer: Self-pay

## 2014-06-04 DIAGNOSIS — Z1231 Encounter for screening mammogram for malignant neoplasm of breast: Secondary | ICD-10-CM

## 2015-05-26 ENCOUNTER — Other Ambulatory Visit: Payer: Self-pay

## 2015-05-26 DIAGNOSIS — Z1231 Encounter for screening mammogram for malignant neoplasm of breast: Secondary | ICD-10-CM

## 2015-07-01 ENCOUNTER — Ambulatory Visit
Admission: RE | Admit: 2015-07-01 | Discharge: 2015-07-01 | Disposition: A | Discharge status: Home / Self Care | Payer: 59 | Source: Ambulatory Visit

## 2015-07-01 DIAGNOSIS — Z1231 Encounter for screening mammogram for malignant neoplasm of breast: Secondary | ICD-10-CM

## 2015-11-17 MED FILL — HYDROCHLOROTHIAZIDE 25 MG T: 25 | 90 days supply | Qty: 90 | Fill #2

## 2015-11-17 MED FILL — ATENOLOL 50 MG TABLET: 50 | 90 days supply | Qty: 90 | Fill #2

## 2015-11-17 MED FILL — PANTOPRAZOLE SOD DR 40 MG T: 40 | 90 days supply | Qty: 90 | Fill #2

## 2015-11-17 MED FILL — valACYclovir HCL 1 GM TABS: 1 | 90 days supply | Qty: 90 | Fill #2

## 2015-11-17 MED FILL — QUINAPRIL 40 MG TABLET: 40 | 90 days supply | Qty: 90 | Fill #2

## 2015-12-04 ENCOUNTER — Encounter (HOSPITAL_BASED_OUTPATIENT_CLINIC_OR_DEPARTMENT_OTHER): Payer: Self-pay | Admitting: *Deleted

## 2015-12-07 ENCOUNTER — Encounter (HOSPITAL_BASED_OUTPATIENT_CLINIC_OR_DEPARTMENT_OTHER): Payer: Self-pay | Admitting: *Deleted

## 2015-12-09 ENCOUNTER — Other Ambulatory Visit: Payer: Self-pay | Admitting: Orthopedic Surgery

## 2015-12-10 ENCOUNTER — Encounter (HOSPITAL_BASED_OUTPATIENT_CLINIC_OR_DEPARTMENT_OTHER)
Admission: RE | Admit: 2015-12-10 | Discharge: 2015-12-10 | Disposition: A | Discharge status: Home / Self Care | Payer: 59 | Source: Ambulatory Visit | Attending: Orthopedic Surgery | Admitting: Orthopedic Surgery

## 2015-12-10 DIAGNOSIS — M24031 Loose body in right wrist: Secondary | ICD-10-CM | POA: Diagnosis not present

## 2015-12-10 DIAGNOSIS — Z87891 Personal history of nicotine dependence: Secondary | ICD-10-CM | POA: Diagnosis not present

## 2015-12-10 DIAGNOSIS — E7800 Pure hypercholesterolemia, unspecified: Secondary | ICD-10-CM | POA: Diagnosis not present

## 2015-12-10 DIAGNOSIS — M6588 Other synovitis and tenosynovitis, other site: Secondary | ICD-10-CM | POA: Diagnosis not present

## 2015-12-10 DIAGNOSIS — M19031 Primary osteoarthritis, right wrist: Secondary | ICD-10-CM | POA: Diagnosis not present

## 2015-12-10 DIAGNOSIS — K219 Gastro-esophageal reflux disease without esophagitis: Secondary | ICD-10-CM | POA: Diagnosis not present

## 2015-12-10 DIAGNOSIS — I10 Essential (primary) hypertension: Secondary | ICD-10-CM | POA: Diagnosis not present

## 2015-12-10 LAB — BASIC METABOLIC PANEL
ANION GAP: 13 (ref 5–15)
BUN: 21 mg/dL — ABNORMAL HIGH (ref 6–20)
CHLORIDE: 95 mmol/L — AB (ref 101–111)
CO2: 32 mmol/L (ref 22–32)
Calcium: 10.3 mg/dL (ref 8.9–10.3)
Creatinine, Ser: 0.6 mg/dL (ref 0.44–1.00)
GFR calc Af Amer: >60 mL/min (ref >60)
GLUCOSE: 126 mg/dL — AB (ref 65–99)
POTASSIUM: 4.4 mmol/L (ref 3.5–5.1)
Sodium: 140 mmol/L (ref 135–145)

## 2015-12-11 ENCOUNTER — Ambulatory Visit (HOSPITAL_BASED_OUTPATIENT_CLINIC_OR_DEPARTMENT_OTHER): Payer: 59 | Admitting: Certified Registered"

## 2015-12-11 ENCOUNTER — Encounter (HOSPITAL_BASED_OUTPATIENT_CLINIC_OR_DEPARTMENT_OTHER): Payer: Self-pay | Admitting: Certified Registered"

## 2015-12-11 ENCOUNTER — Ambulatory Visit (HOSPITAL_BASED_OUTPATIENT_CLINIC_OR_DEPARTMENT_OTHER)
Admission: RE | Admit: 2015-12-11 | Discharge: 2015-12-11 | Disposition: A | Discharge status: Home / Self Care | Payer: 59 | Source: Ambulatory Visit | Attending: Orthopedic Surgery | Admitting: Orthopedic Surgery

## 2015-12-11 ENCOUNTER — Encounter (HOSPITAL_BASED_OUTPATIENT_CLINIC_OR_DEPARTMENT_OTHER)
Admission: RE | Disposition: A | Discharge status: Home / Self Care | Payer: Self-pay | Source: Ambulatory Visit | Attending: Orthopedic Surgery

## 2015-12-11 DIAGNOSIS — Z87891 Personal history of nicotine dependence: Secondary | ICD-10-CM | POA: Diagnosis not present

## 2015-12-11 DIAGNOSIS — E7800 Pure hypercholesterolemia, unspecified: Secondary | ICD-10-CM | POA: Diagnosis not present

## 2015-12-11 DIAGNOSIS — G8918 Other acute postprocedural pain: Secondary | ICD-10-CM | POA: Diagnosis not present

## 2015-12-11 DIAGNOSIS — I10 Essential (primary) hypertension: Secondary | ICD-10-CM | POA: Diagnosis not present

## 2015-12-11 DIAGNOSIS — M19031 Primary osteoarthritis, right wrist: Secondary | ICD-10-CM | POA: Insufficient documentation

## 2015-12-11 DIAGNOSIS — K219 Gastro-esophageal reflux disease without esophagitis: Secondary | ICD-10-CM | POA: Insufficient documentation

## 2015-12-11 DIAGNOSIS — M6588 Other synovitis and tenosynovitis, other site: Secondary | ICD-10-CM | POA: Diagnosis not present

## 2015-12-11 DIAGNOSIS — M25531 Pain in right wrist: Secondary | ICD-10-CM | POA: Diagnosis not present

## 2015-12-11 DIAGNOSIS — M65841 Other synovitis and tenosynovitis, right hand: Secondary | ICD-10-CM | POA: Diagnosis not present

## 2015-12-11 DIAGNOSIS — M659 Synovitis and tenosynovitis, unspecified: Secondary | ICD-10-CM | POA: Diagnosis not present

## 2015-12-11 DIAGNOSIS — M24031 Loose body in right wrist: Secondary | ICD-10-CM | POA: Insufficient documentation

## 2015-12-11 HISTORY — PX: ARTHROTOMY: SHX5728

## 2015-12-11 HISTORY — DX: Irritable bowel syndrome, unspecified: K58.9

## 2015-12-11 HISTORY — PX: SYNOVECTOMY: SHX5180

## 2015-12-11 HISTORY — PX: WRIST ARTHROSCOPY: SHX838

## 2015-12-11 HISTORY — DX: Personal history of other diseases of the digestive system: Z87.19

## 2015-12-11 SURGERY — ARTHROSCOPY, WRIST
Anesthesia: General | Site: Wrist | Laterality: Right

## 2015-12-11 MED ORDER — PROMETHAZINE HCL 25 MG/ML IJ SOLN: 6.2500 mg | INTRAMUSCULAR | Status: DC | PRN

## 2015-12-11 MED ORDER — BUPIVACAINE HCL (PF) 0.5 % IJ SOLN
INTRAMUSCULAR | Status: DC | PRN
  Administered 2015-12-11: 25 mL via PERINEURAL

## 2015-12-11 MED ORDER — TAPENTADOL HCL 50 MG PO TABS: 100.0000 mg | ORAL_TABLET | ORAL | Status: DC | PRN

## 2015-12-11 MED ORDER — SCOPOLAMINE 1 MG/3DAYS TD PT72: 1.0000 | MEDICATED_PATCH | Freq: Once | TRANSDERMAL | Status: DC | PRN

## 2015-12-11 MED ORDER — HYDROMORPHONE HCL 1 MG/ML IJ SOLN: 0.2500 mg | INTRAMUSCULAR | Status: DC | PRN

## 2015-12-11 MED ORDER — DEXAMETHASONE SODIUM PHOSPHATE 10 MG/ML IJ SOLN
INTRAMUSCULAR | Status: AC
  Filled 2015-12-11: qty 1

## 2015-12-11 MED ORDER — SUCCINYLCHOLINE CHLORIDE 20 MG/ML IJ SOLN
INTRAMUSCULAR | Status: AC
  Filled 2015-12-11: qty 1

## 2015-12-11 MED ORDER — MIDAZOLAM HCL 2 MG/2ML IJ SOLN
1.0000 mg | INTRAMUSCULAR | Status: DC | PRN
  Administered 2015-12-11 (×2): 2 mg via INTRAVENOUS

## 2015-12-11 MED ORDER — PROPOFOL 500 MG/50ML IV EMUL
INTRAVENOUS | Status: AC
  Filled 2015-12-11: qty 50

## 2015-12-11 MED ORDER — ONDANSETRON HCL 4 MG/2ML IJ SOLN
INTRAMUSCULAR | Status: DC | PRN
  Administered 2015-12-11: 4 mg via INTRAVENOUS

## 2015-12-11 MED ORDER — CEFAZOLIN SODIUM-DEXTROSE 2-3 GM-% IV SOLR
INTRAVENOUS | Status: AC
  Filled 2015-12-11: qty 50

## 2015-12-11 MED ORDER — BUPIVACAINE HCL (PF) 0.25 % IJ SOLN
INTRAMUSCULAR | Status: AC
  Filled 2015-12-11: qty 60

## 2015-12-11 MED ORDER — ONDANSETRON HCL 4 MG/2ML IJ SOLN
INTRAMUSCULAR | Status: AC
  Filled 2015-12-11: qty 2

## 2015-12-11 MED ORDER — LIDOCAINE HCL (CARDIAC) 20 MG/ML IV SOLN
INTRAVENOUS | Status: AC
  Filled 2015-12-11: qty 5

## 2015-12-11 MED ORDER — MIDAZOLAM HCL 2 MG/2ML IJ SOLN
INTRAMUSCULAR | Status: AC
  Filled 2015-12-11: qty 2

## 2015-12-11 MED ORDER — BUPIVACAINE HCL (PF) 0.5 % IJ SOLN
INTRAMUSCULAR | Status: AC
  Filled 2015-12-11: qty 60

## 2015-12-11 MED ORDER — LIDOCAINE-EPINEPHRINE (PF) 1.5 %-1:200000 IJ SOLN
INTRAMUSCULAR | Status: DC | PRN
  Administered 2015-12-11: 15 mL via PERINEURAL

## 2015-12-11 MED ORDER — CHLORHEXIDINE GLUCONATE 4 % EX LIQD: 60.0000 mL | Freq: Once | CUTANEOUS | Status: DC

## 2015-12-11 MED ORDER — LACTATED RINGERS IV SOLN
INTRAVENOUS | Status: DC
  Administered 2015-12-11 (×2): via INTRAVENOUS

## 2015-12-11 MED ORDER — FENTANYL CITRATE (PF) 100 MCG/2ML IJ SOLN
50.0000 ug | INTRAMUSCULAR | Status: DC | PRN
  Administered 2015-12-11: 100 ug via INTRAVENOUS

## 2015-12-11 MED ORDER — FENTANYL CITRATE (PF) 100 MCG/2ML IJ SOLN
INTRAMUSCULAR | Status: AC
  Filled 2015-12-11: qty 2

## 2015-12-11 MED ORDER — PROPOFOL 10 MG/ML IV BOLUS
INTRAVENOUS | Status: DC | PRN
  Administered 2015-12-11: 200 mg via INTRAVENOUS

## 2015-12-11 MED ORDER — GLYCOPYRROLATE 0.2 MG/ML IJ SOLN: 0.2000 mg | Freq: Once | INTRAMUSCULAR | Status: DC | PRN

## 2015-12-11 MED ORDER — LIDOCAINE HCL (CARDIAC) 20 MG/ML IV SOLN
INTRAVENOUS | Status: DC | PRN
  Administered 2015-12-11: 30 mg via INTRAVENOUS

## 2015-12-11 MED ORDER — CEFAZOLIN SODIUM-DEXTROSE 2-3 GM-% IV SOLR
2.0000 g | INTRAVENOUS | Status: AC
  Administered 2015-12-11: 2 g via INTRAVENOUS

## 2015-12-11 MED ORDER — DEXAMETHASONE SODIUM PHOSPHATE 10 MG/ML IJ SOLN
INTRAMUSCULAR | Status: DC | PRN
  Administered 2015-12-11: 10 mg via INTRAVENOUS

## 2015-12-11 MED FILL — NUCYNTA 50 MG TABLET: 50 | 4 days supply | Qty: 50 | Fill #0

## 2015-12-11 SURGICAL SUPPLY — 83 items
BANDAGE ACE 3X5.8 VEL STRL LF (GAUZE/BANDAGES/DRESSINGS) ×4 IMPLANT
BANDAGE ACE 4X5 VEL STRL LF (GAUZE/BANDAGES/DRESSINGS) ×2 IMPLANT
BANDAGE COBAN STERILE 2 (GAUZE/BANDAGES/DRESSINGS) ×2 IMPLANT
BLADE CUDA 2.0 (BLADE) IMPLANT
BLADE SURG 15 STRL LF DISP TIS (BLADE) ×2 IMPLANT
BLADE SURG 15 STRL SS (BLADE) ×2
BNDG COHESIVE 3X5 TAN STRL LF (GAUZE/BANDAGES/DRESSINGS) ×2 IMPLANT
BNDG CONFORM 3 STRL LF (GAUZE/BANDAGES/DRESSINGS) ×2 IMPLANT
BNDG GAUZE ELAST 4 BULKY (GAUZE/BANDAGES/DRESSINGS) ×2 IMPLANT
BRUSH SCRUB EZ PLAIN DRY (MISCELLANEOUS) ×2 IMPLANT
BUR FULL RADIUS 2.0 (BURR) ×2 IMPLANT
CORDS BIPOLAR (ELECTRODE) ×2 IMPLANT
COVER BACK TABLE 60X90IN (DRAPES) ×2 IMPLANT
CUFF TOURNIQUET SINGLE 18IN (TOURNIQUET CUFF) ×2 IMPLANT
DECANTER SPIKE VIAL GLASS SM (MISCELLANEOUS) IMPLANT
DRAPE EXTREMITY T 121X128X90 (DRAPE) ×2 IMPLANT
DRAPE OEC MINIVIEW 54X84 (DRAPES) ×2 IMPLANT
DRAPE SURG 17X23 STRL (DRAPES) ×2 IMPLANT
DRSG EMULSION OIL 3X3 NADH (GAUZE/BANDAGES/DRESSINGS) ×2 IMPLANT
FIBERSTICK 2 (SUTURE) IMPLANT
GAUZE SPONGE 4X4 12PLY STRL (GAUZE/BANDAGES/DRESSINGS) ×2 IMPLANT
GAUZE SPONGE 4X4 16PLY XRAY LF (GAUZE/BANDAGES/DRESSINGS) IMPLANT
GAUZE XEROFORM 1X8 LF (GAUZE/BANDAGES/DRESSINGS) IMPLANT
GAUZE XEROFORM 5X9 LF (GAUZE/BANDAGES/DRESSINGS) ×2 IMPLANT
GLOVE BIO SURGEON STRL SZ 6.5 (GLOVE) ×2 IMPLANT
GLOVE BIO SURGEON STRL SZ8 (GLOVE) ×2 IMPLANT
GLOVE BIOGEL M STRL SZ7.5 (GLOVE) IMPLANT
GLOVE BIOGEL PI IND STRL 7.0 (GLOVE) ×2 IMPLANT
GLOVE BIOGEL PI IND STRL 7.5 (GLOVE) ×1 IMPLANT
GLOVE BIOGEL PI INDICATOR 7.0 (GLOVE) ×2
GLOVE BIOGEL PI INDICATOR 7.5 (GLOVE) ×1
GLOVE SS BIOGEL STRL SZ 8 (GLOVE) ×1 IMPLANT
GLOVE SUPERSENSE BIOGEL SZ 8 (GLOVE) ×1
GLOVE SURG SS PI 8.0 STRL IVOR (GLOVE) ×2 IMPLANT
GOWN STRL REUS W/ TWL LRG LVL3 (GOWN DISPOSABLE) ×2 IMPLANT
GOWN STRL REUS W/TWL LRG LVL3 (GOWN DISPOSABLE) ×2
IV NS IRRIG 3000ML ARTHROMATIC (IV SOLUTION) IMPLANT
NDL SUT 6 .5 CRC .975X.05 MAYO (NEEDLE) IMPLANT
NEEDLE HYPO 22GX1.5 SAFETY (NEEDLE) ×2 IMPLANT
NEEDLE HYPO 25X1 1.5 SAFETY (NEEDLE) IMPLANT
NEEDLE MAYO 6 CRC TAPER PT (NEEDLE) IMPLANT
NEEDLE MAYO TAPER (NEEDLE)
NS IRRIG 1000ML POUR BTL (IV SOLUTION) ×2 IMPLANT
PACK BASIN DAY SURGERY FS (CUSTOM PROCEDURE TRAY) ×2 IMPLANT
PAD CAST 3X4 CTTN HI CHSV (CAST SUPPLIES) ×2 IMPLANT
PAD CAST 4YDX4 CTTN HI CHSV (CAST SUPPLIES) IMPLANT
PADDING CAST ABS 4INX4YD NS (CAST SUPPLIES) ×1
PADDING CAST ABS COTTON 4X4 ST (CAST SUPPLIES) ×1 IMPLANT
PADDING CAST COTTON 3X4 STRL (CAST SUPPLIES) ×2
PADDING CAST COTTON 4X4 STRL (CAST SUPPLIES)
PASSER SUT SWANSON 36MM LOOP (INSTRUMENTS) IMPLANT
SET SM JOINT TUBING/CANN (CANNULA) ×2 IMPLANT
SHEET MEDIUM DRAPE 40X70 STRL (DRAPES) ×2 IMPLANT
SLEEVE SCD COMPRESS KNEE MED (MISCELLANEOUS) ×2 IMPLANT
SLING ARM MED ADULT FOAM STRAP (SOFTGOODS) IMPLANT
SPLINT FAST PLASTER 5X30 (CAST SUPPLIES)
SPLINT FIBERGLASS 3X35 (CAST SUPPLIES) ×2 IMPLANT
SPLINT FIBERGLASS 4X30 (CAST SUPPLIES) IMPLANT
SPLINT PLASTER CAST FAST 5X30 (CAST SUPPLIES) IMPLANT
STOCKINETTE 4X48 STRL (DRAPES) ×2 IMPLANT
STOCKINETTE SYNTHETIC 3 UNSTER (CAST SUPPLIES) ×2 IMPLANT
STRIP CLOSURE SKIN 1/2X4 (GAUZE/BANDAGES/DRESSINGS) ×2 IMPLANT
SUT ETHIBOND 3-0 V-5 (SUTURE) IMPLANT
SUT FIBERWIRE 4-0 18 DIAM BLUE (SUTURE)
SUT PDS 2 CP NEEDLE XSPECIAL (SUTURE) IMPLANT
SUT PDS AB 2-0 CT2 27 (SUTURE) IMPLANT
SUT PROLENE 3 0 PS 2 (SUTURE) IMPLANT
SUT PROLENE 4 0 P 3 18 (SUTURE) IMPLANT
SUT PROLENE 4 0 PS 2 18 (SUTURE) ×4 IMPLANT
SUT VIC AB 3-0 FS2 27 (SUTURE) IMPLANT
SUTURE FIBERWR 4-0 18 DIA BLUE (SUTURE) IMPLANT
SYR BULB 3OZ (MISCELLANEOUS) ×2 IMPLANT
SYR CONTROL 10ML LL (SYRINGE) ×2 IMPLANT
TAPE SURG TRANSPORE 1 IN (GAUZE/BANDAGES/DRESSINGS) ×1 IMPLANT
TAPE SURGICAL TRANSPORE 1 IN (GAUZE/BANDAGES/DRESSINGS) ×1
TOWEL OR 17X24 6PK STRL BLUE (TOWEL DISPOSABLE) ×4 IMPLANT
TOWEL OR NON WOVEN STRL DISP B (DISPOSABLE) ×2 IMPLANT
TRAP DIGIT (INSTRUMENTS) IMPLANT
TRAP FINGER LRG (INSTRUMENTS) IMPLANT
TUBE CONNECTING 20X1/4 (TUBING) ×2 IMPLANT
UNDERPAD 30X30 (UNDERPADS AND DIAPERS) ×2 IMPLANT
WAND 1.5 MICROBLATOR (SURGICAL WAND) ×2 IMPLANT
WATER STERILE IRR 1000ML POUR (IV SOLUTION) ×2 IMPLANT

## 2015-12-11 NOTE — Op Note (Signed)
See [id number] Amedeo Plenty MD

## 2015-12-11 NOTE — Anesthesia Procedure Notes (Addendum)
Anesthesia Regional Block:  Supraclavicular block  Pre-Anesthetic Checklist: ,, timeout performed, Correct Patient, Correct Site, Correct Laterality, Correct Procedure, Correct Position, site marked, Risks and benefits discussed,  Surgical consent,  Pre-op evaluation,  At surgeon's request and post-op pain management  Laterality: Right  Prep: chloraprep       Needles:  Injection technique: Single-shot  Needle Type: Echogenic Stimulator Needle     Needle Length: 9cm 9 cm Needle Gauge: 21 and 21 G    Additional Needles:  Procedures: ultrasound guided (picture in chart) Supraclavicular block Narrative:  Injection made incrementally with aspirations every 5 mL.  Performed by: Personally  Anesthesiologist: ROSE, GEORGE  Additional Notes: Patient tolerated the procedure well without complications   Procedure Name: LMA Insertion Date/Time: 12/11/2015 7:49 AM Performed by: Avanthika Dehnert D Pre-anesthesia Checklist: Patient identified, Emergency Drugs available, Suction available and Patient being monitored Patient Re-evaluated:Patient Re-evaluated prior to inductionOxygen Delivery Method: Circle System Utilized Preoxygenation: Pre-oxygenation with 100% oxygen Intubation Type: IV induction Ventilation: Mask ventilation without difficulty LMA: LMA inserted LMA Size: 4.0 Number of attempts: 1 Airway Equipment and Method: Bite block Placement Confirmation: positive ETCO2 Tube secured with: Tape Dental Injury: Teeth and Oropharynx as per pre-operative assessment

## 2015-12-11 NOTE — Anesthesia Preprocedure Evaluation (Addendum)
Anesthesia Evaluation  Patient identified by MRN, date of birth, ID band Patient awake    Reviewed: Allergy & Precautions, NPO status , Patient's Chart, lab work & pertinent test results  Airway Mallampati: II  TM Distance: >3 FB Neck ROM: Full    Dental no notable dental hx.    Pulmonary neg pulmonary ROS, former smoker,    Pulmonary exam normal breath sounds clear to auscultation       Cardiovascular hypertension, Pt. on home beta blockers and Pt. on medications Normal cardiovascular exam Rhythm:Regular Rate:Normal     Neuro/Psych negative neurological ROS  negative psych ROS   GI/Hepatic Neg liver ROS, GERD  Medicated,  Endo/Other  negative endocrine ROS  Renal/GU negative Renal ROS  negative genitourinary   Musculoskeletal negative musculoskeletal ROS (+) Arthritis ,   Abdominal   Peds negative pediatric ROS (+)  Hematology negative hematology ROS (+)   Anesthesia Other Findings   Reproductive/Obstetrics negative OB ROS                            Anesthesia Physical Anesthesia Plan  ASA: II  Anesthesia Plan: General   Post-op Pain Management: GA combined w/ Regional for post-op pain   Induction: Intravenous  Airway Management Planned: LMA  Additional Equipment:   Intra-op Plan:   Post-operative Plan: Extubation in OR  Informed Consent: I have reviewed the patients History and Physical, chart, labs and discussed the procedure including the risks, benefits and alternatives for the proposed anesthesia with the patient or authorized representative who has indicated his/her understanding and acceptance.   Dental advisory given  Plan Discussed with: CRNA and Surgeon  Anesthesia Plan Comments:         Anesthesia Quick Evaluation

## 2015-12-11 NOTE — Transfer of Care (Signed)
Immediate Anesthesia Transfer of Care Note  Patient: [name] [name]  Procedure(s) Performed: Procedure(s): RIGHT WRIST ARTHROSCOPY WITH DEBRIDEMENT  (Right) OPEN ARTHROTOMY  (Right) SYNOVECTOMY DISTAL RADIOULNAR JOINT LOOSE BODY REMOVAL  (Right)  Patient Location: PACU  Anesthesia Type:GA combined with regional for post-op pain  Level of Consciousness: awake, alert , oriented and patient cooperative  Airway & Oxygen Therapy: Patient Spontanous Breathing and Patient connected to face mask oxygen  Post-op Assessment: Report given to RN and Post -op Vital signs reviewed and stable  Post vital signs: Reviewed and stable  Last Vitals:  Filed Vitals:   12/11/15 0715 12/11/15 0720  BP: 103/65   Pulse: 71 70  Temp:    Resp: 16 16    Complications: No apparent anesthesia complications

## 2015-12-11 NOTE — H&P (Signed)
[name] [name] is an 55 y.o. female.   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 She understands the plan of care. Just complains of ulnar wrist pain. Her examination shows loose body formation in the distal radial ulnar joint and arthritic change. Our goal is to remove loose bodies debrided the joint and try to give her better semblance of function. She has not demonstrated any excessive activity and her EC U tendon.  She is continued complaint of ulnar wrist pain we'll plan for a concomitant arthroscopy with debridement as necessary    She understands risk and benefits and desires to proceed.  Questions have been encouraged and answered  Past Medical History  Diagnosis Date  . Cerebral aneurysm 1999    Surgically coiled  . Hypertension   . Hypercholesterolemia   . Sinus disease   . GERD (gastroesophageal reflux disease)   . DDD (degenerative disc disease)   . Irritable bowel disease     under control  . History of Barrett's esophagus     Past Surgical History  Procedure Laterality Date  . Nasal sinus surgery  2013  . Appendectomy    . Cerebral aneurysm repair  1999    coiled  . Eye surgery      lasik  . Shoulder arthroscopy      lt  . Abdominal hysterectomy   2005    abd  . Breast enhancement surgery    . Diagnostic laparoscopy    . Orif ankle fracture  03/01/2012    Procedure: OPEN REDUCTION INTERNAL FIXATION (ORIF) ANKLE FRACTURE;  Surgeon: Wylene Simmer, MD;  Location: [place];  Service: Orthopedics;  Laterality: Right;  Open reduction internal fixation right navicular fracture  . Lumbar disc surgery Left 06/03/2010    anterolateral decompression and arthrodesis L3-4, L4-5  . Back surgery      lumbar    Family History  Problem Relation Age of Onset  . Hypertension Father   . Hypertension Mother    Social History:  reports that she quit smoking  about 31 years ago. She does not have any smokeless tobacco history on file. She reports that she drinks alcohol. She reports that she does not use illicit drugs.  Allergies:  Allergies  Allergen Reactions  . Hydrocodone-Acetaminophen Itching  . Morphine And Related Nausea Only    Nausea and vomitting  . Tetracyclines & Related     Break out on all extremties    Medications Prior to Admission  Medication Sig Dispense Refill  . atenolol (TENORMIN) 50 MG tablet Take 50 mg by mouth at bedtime.    . hydrochlorothiazide (HYDRODIURIL) 25 MG tablet Take 25 mg by mouth daily.    . Omega-3 Fatty Acids (FISH OIL) 1000 MG CAPS Take by mouth.    . pantoprazole (PROTONIX) 40 MG tablet Take 40 mg by mouth daily.    . quinapril (ACCUPRIL) 40 MG tablet Take 40 mg by mouth daily.    Marland Kitchen Specialty Vitamins Products (MAGNESIUM, AMINO ACID CHELATE,) 133 MG tablet Take 1 tablet by mouth 2 (two) times daily.      Results for orders placed or performed during the hospital encounter of 12/11/15 (from the past 48 hour(s))  Basic metabolic panel     Status: Abnormal   Collection Time: 12/10/15  2:57 PM  Result Value Ref Range   Sodium 140 135 - 145 mmol/L   Potassium  4.4 3.5 - 5.1 mmol/L   Chloride 95 (L) 101 - 111 mmol/L   CO2 32 22 - 32 mmol/L   Glucose, Bld 126 (H) 65 - 99 mg/dL   BUN 21 (H) 6 - 20 mg/dL   Creatinine, Ser 0.60 0.44 - 1.00 mg/dL   Calcium 10.3 8.9 - 10.3 mg/dL   GFR calc non Af Amer >60 >60 mL/min   GFR calc Af Amer >60 >60 mL/min    Comment: (NOTE) The eGFR has been calculated using the CKD EPI equation. This calculation has not been validated in all clinical situations. eGFR's persistently <60 mL/min signify possible Chronic Kidney Disease.    Anion gap 13 5 - 15   No results found.  Review of Systems  Respiratory: Negative.   Gastrointestinal: Negative.   Genitourinary: Negative.   Neurological: Negative.   Endo/Heme/Allergies: Negative.   Psychiatric/Behavioral:  Negative.     Blood pressure 103/65, pulse 84, temperature 97.9 F (36.6 C), temperature source Oral, resp. rate 14, height _0  (1.753 m), weight 67.359 kg (148 lb 8 oz), SpO2 100 %. Physical Exam  Right wrist pain chronic in nature with mechanical symptoms. No evidence of EC U or DRUJ instability. Patient and I reviewed this at length. The patient is alert and oriented in no acute distress. The patient complains of pain in the affected upper extremity.  The patient is noted to have a normal HEENT exam. Lung fields show equal chest expansion and no shortness of breath. Abdomen exam is nontender without distention. Lower extremity examination does not show any fracture dislocation or blood clot symptoms. Pelvis is stable and the neck and back are stable and nontender. Assessment/Plan Will plan for arthrotomy site object mealy spotty removal and debridement distal radial ulnar joint we'll also plan for arthroscopy and repair is necessary right wrist We are planning surgery for your upper extremity. The risk and benefits of surgery to include risk of bleeding, infection, anesthesia,  damage to normal structures and failure of the surgery to accomplish its intended goals of relieving symptoms and restoring function have been discussed in detail. With this in mind we plan to proceed. I have specifically discussed with the patient the pre-and postoperative regime and the dos and don'ts and risk and benefits in great detail. Risk and benefits of surgery also include risk of dystrophy(CRPS), chronic nerve pain, failure of the healing process to go onto completion and other inherent risks of surgery The relavent the pathophysiology of the disease/injury process, as well as the alternatives for treatment and postoperative course of action has been discussed in great detail with the patient who desires to proceed.  We will do everything in our power to help you (the patient) restore function to the upper  extremity. It is a pleasure to see this patient today.  [name],[name] M 12/11/2015, 9:30 AM

## 2015-12-11 NOTE — Discharge Instructions (Signed)
Please call for any problems.  Please keep your bandage clean and dry.  Office will call for your follow-up in 12-14 days.  We recommend vitamin C thousand milligrams a day to promote healing and a stool softener if you have to take pain medicine. It is okay to take Tylenol and ibuprofen along with the medicine that we prescribed for pain  We recommend that you to take vitamin C 1000 mg a day to promote healing. We also recommend that if you require  pain medicine that you take a stool softener to prevent constipation as most pain medicines will have constipation side effects. We recommend either Peri-Colace or Senokot and recommend that you also consider adding MiraLAX to prevent the constipation affects from pain medicine if you are required to use them. These medicines are over the counter and maybe purchased at a local pharmacy. A cup of yogurt and a probiotic can also be helpful during the recovery process as the medicines can disrupt your intestinal environment. Keep bandage clean and dry.  Call for any problems.  No smoking.  Criteria for driving a car: you should be off your pain medicine for 7-8 hours, able to drive one handed(confident), thinking clearly and feeling able in your judgement to drive. Continue elevation as it will decrease swelling.  If instructed by MD move your fingers within the confines of the bandage/splint.  Use ice if instructed by your MD. Call immediately for any sudden loss of feeling in your hand/arm or change in functional abilities of the extremity.   Post Anesthesia Home Care Instructions  Activity: Get plenty of rest for the remainder of the day. A responsible adult should stay with you for 24 hours following the procedure.  For the next 24 hours, DO NOT: -Drive a car -Paediatric nurse -Drink alcoholic beverages -Take any medication unless instructed by your physician -Make any legal decisions or sign important papers.  Meals: Start with liquid foods  such as gelatin or soup. Progress to regular foods as tolerated. Avoid greasy, spicy, heavy foods. If nausea and/or vomiting occur, drink only clear liquids until the nausea and/or vomiting subsides. Call your physician if vomiting continues.  Special Instructions/Symptoms: Your throat may feel dry or sore from the anesthesia or the breathing tube placed in your throat during surgery. If this causes discomfort, gargle with warm salt water. The discomfort should disappear within 24 hours.  If you had a scopolamine patch placed behind your ear for the management of post- operative nausea and/or vomiting:  1. The medication in the patch is effective for 72 hours, after which it should be removed.  Wrap patch in a tissue and discard in the trash. Wash hands thoroughly with soap and water. 2. You may remove the patch earlier than 72 hours if you experience unpleasant side effects which may include dry mouth, dizziness or visual disturbances. 3. Avoid touching the patch. Wash your hands with soap and water after contact with the patch.    Regional Anesthesia Blocks  1. Numbness or the inability to move the "blocked" extremity may last from 3-48 hours after placement. The length of time depends on the medication injected and your individual response to the medication. If the numbness is not going away after 48 hours, call your surgeon.  2. The extremity that is blocked will need to be protected until the numbness is gone and the  Strength has returned. Because you cannot feel it, you will need to take extra care to avoid injury.  Because it may be weak, you may have difficulty moving it or using it. You may not know what position it is in without looking at it while the block is in effect.  3. For blocks in the legs and feet, returning to weight bearing and walking needs to be done carefully. You will need to wait until the numbness is entirely gone and the strength has returned. You should be able to move  your leg and foot normally before you try and bear weight or walk. You will need someone to be with you when you first try to ensure you do not fall and possibly risk injury.  4. Bruising and tenderness at the needle site are common side effects and will resolve in a few days.  5. Persistent numbness or new problems with movement should be communicated to the surgeon or the [facility] [phone] [place] [phone]).

## 2015-12-11 NOTE — Progress Notes (Signed)
Assisted Dr. Kalman Shan with right, ultrasound guided, infraclavicular block. Side rails up, monitors on throughout procedure. See vital signs in flow sheet. Tolerated Procedure well.

## 2015-12-11 NOTE — Op Note (Signed)
[name] [name], [name] [name] NO.:  [id number]  MEDICAL RECORD NO.:  [id number]  LOCATION:                                 FACILITY:  PHYSICIAN:  Satira Anis. Christphor Groft, M.D.DATE OF BIRTH:  1961/04/13  DATE OF PROCEDURE: DATE OF DISCHARGE:                              OPERATIVE REPORT   PREOPERATIVE DIAGNOSES: 1. 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 2. Synovitis, right wrist.  POSTOPERATIVE DIAGNOSES: 1. 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 2. Synovitis, right wrist.  PROCEDURES: 1. Evaluation under anesthesia, right wrist. 2. Open arthrotomy, synovectomy, wrist joint about the distal     radioulnar joint. 3. Loose body removal, distal radioulnar joint, right wrist. 4. Excision of osteophytic spur, distal ulna. 5. Limited released Guyon's canal, right wrist. 6. Arthroscopy with synovectomy of the TFC and ulnar wrist region,     right wrist.  This was a stable TFC construct.  SURGEON:  Satira Anis. Amedeo Plenty, M.D.  ASSISTANT:  None.  COMPLICATION:  None.  ANESTHESIA:  General with preoperative block.  TOURNIQUET TIME:  Less than an hour.  INDICATIONS FOR THE PROCEDURE:  [name] [name] is a 55 year old female, who is active with her wrist and hand.  She presents for the surgical procedure as outlined above.  OPERATION IN DETAIL:  The patient was seen by myself and Anesthesia, taken to the operating suite, underwent smooth induction of general anesthetic.  Preoperative block was placed and it was in excellent working fashion.  She was prepped and draped in usual sterile fashion after bed was turned and body parts were well secured with SCD devices placed.  After sterile field was secured, time-out was observed, preoperative antibiotics were given without issue and the patient then underwent a very careful and cautious approach to the extremity  with elevation of the tourniquet and a volar ulnar incision was made, zigzag in nature.  I dissected down and released the fascia.  Following this, the ulnar artery and ulnar nerve were identified and the proximal aspect of Guyon's canal was completely released for retraction purposes and exposure purposes.  The potential swelling postoperatively can cause some degree of compression and thus, the proximal portion of Guyon's was released under direct 4.0 loupe magnification.  After release of Guyon's canal proximally, I then performed the interval retraction.  Carpal canal contents were retracted radial.  Neurovascular bundle was retracted ulnarly (ulnar nerve and ulnar artery).  This allowed the access to the distal radioulnar joint.  I used a horizontal split at the area of the pronator and accessed the distal radioulnar joint.  I opened the distal radioulnar joint on its most proximal ledge and identified the distal radioulnar joint.  I wanted to be careful not to destabilize the joint, although was arthritic.  I identified loose bodies and removed them x2.  The large loose body was most impressive and was a centimeter or greater.  Following loose body removal, I performed arthrotomy and additional synovectomy.  At this time, I brought in x-ray and identified the  very large inferior osteophyte.  I felt that this would be easy and asked to take away and thus with combination of osteotome, rongeur and orthopedic instrument, removed the very large inferior osteophyte.  This was checked under x- ray and looked excellent, I was pleased with this.  I placed bone wax against the eburnated bone and was quite pleased with the removal process.  Following this, I demonstrated excellent stability about the distal radioulnar joint and was quite pleased.  I allowed the capsular flap to rest as it lay, but did not over so anything.  The pronator rested nicely and I did not have to detach or damage  the pronator as I was very meticulous with this part of the dissection.  Irrigation was applied.  Tourniquet was deflated, all looked excellent.  At this time, I placed on finger trap traction and turned attention toward the dorsal apparatus of the wrist.  3-4 portal and 6R working portal were created. This was done without difficulty utilizing the localization.  Scaphoid lunate, scaphoid fossa, lunate fossa looked excellent.  Ligamentous architecture was stable and had changes of a mid 55 year old female. The patient had synovitis and some fraying of the TFC ulnarly and this was debrided with shaver.  I used a full radius resector followed by thermal ablator on setting of 1.  Before and after pictures were taken, this cleaned up quite nicely.  There were no complicating features. Arthroscopic instruments were then removed.  Distal radioulnar joint was assessed one last time and I performed a second look.  All looked well and I was pleased.  I then closed the wound with simple sutures on the dorsal aspect and the volar incision was closed with Prolene sutures well.  She tolerated this well.  She had good stability, I did not detect any aggressive ECU instability preop or intraoperative, but I will continue to monitor this as there was some suggestion of wear and tear at the subsheath on her MRI.  We have discussed all issues with her at length.  I think the main issue is getting the mechanical symptoms away from the distal radioulnar joint and we were quite satisfied with this.  We will see her back in 12-14 days, sutures out, have her go down to therapy and we will go ahead and began long-arm splint at night and short arm splint during the daytime hours.  I want to go slow in general with her.  The first 6 weeks will be simply range of motion allowing the area to heal.  After 6 weeks, we will go ahead and begin some strengthening.  Do's and don'ts discussed and all questions have  been encouraged and answered.  INTRAOPERATIVE REPORT:  AP, lateral and oblique x-rays performed, examined, and interpreted by myself.    Satira Anis. Amedeo Plenty, M.D.    [facility]  D:  12/11/2015  T:  12/11/2015  Job:  [id number]

## 2015-12-11 NOTE — Anesthesia Postprocedure Evaluation (Signed)
Anesthesia Post Note  Patient: [name] [name]  Procedure(s) Performed: Procedure(s) (LRB): RIGHT WRIST ARTHROSCOPY WITH DEBRIDEMENT  (Right) OPEN ARTHROTOMY  (Right) SYNOVECTOMY DISTAL RADIOULNAR JOINT LOOSE BODY REMOVAL  (Right)  Patient location during evaluation: PACU Anesthesia Type: General and Regional Level of consciousness: awake and alert Pain management: pain level controlled Vital Signs Assessment: post-procedure vital signs reviewed and stable Respiratory status: spontaneous breathing, nonlabored ventilation, respiratory function stable and patient connected to nasal cannula oxygen Cardiovascular status: blood pressure returned to baseline and stable Postop Assessment: no signs of nausea or vomiting Anesthetic complications: no    Last Vitals:  Filed Vitals:   12/11/15 0930 12/11/15 0945  BP: 115/72 138/65  Pulse: 77 74  Temp:    Resp: 16 17    Last Pain:  Filed Vitals:   12/11/15 0950  PainSc: 1                  Shimon Trowbridge S

## 2015-12-14 ENCOUNTER — Encounter (HOSPITAL_BASED_OUTPATIENT_CLINIC_OR_DEPARTMENT_OTHER): Payer: Self-pay | Admitting: Orthopedic Surgery

## 2015-12-25 DIAGNOSIS — M25331 Other instability, right wrist: Secondary | ICD-10-CM | POA: Diagnosis not present

## 2015-12-25 DIAGNOSIS — M25531 Pain in right wrist: Secondary | ICD-10-CM | POA: Diagnosis not present

## 2015-12-25 DIAGNOSIS — M25431 Effusion, right wrist: Secondary | ICD-10-CM | POA: Diagnosis not present

## 2015-12-25 DIAGNOSIS — Z4789 Encounter for other orthopedic aftercare: Secondary | ICD-10-CM | POA: Diagnosis not present

## 2015-12-30 DIAGNOSIS — M25431 Effusion, right wrist: Secondary | ICD-10-CM | POA: Diagnosis not present

## 2015-12-30 DIAGNOSIS — M25531 Pain in right wrist: Secondary | ICD-10-CM | POA: Diagnosis not present

## 2016-01-08 DIAGNOSIS — M25531 Pain in right wrist: Secondary | ICD-10-CM | POA: Diagnosis not present

## 2016-01-08 DIAGNOSIS — Z4789 Encounter for other orthopedic aftercare: Secondary | ICD-10-CM | POA: Diagnosis not present

## 2016-01-08 DIAGNOSIS — M25431 Effusion, right wrist: Secondary | ICD-10-CM | POA: Diagnosis not present

## 2016-01-18 DIAGNOSIS — M25431 Effusion, right wrist: Secondary | ICD-10-CM | POA: Diagnosis not present

## 2016-01-18 DIAGNOSIS — M25531 Pain in right wrist: Secondary | ICD-10-CM | POA: Diagnosis not present

## 2016-01-27 DIAGNOSIS — M25431 Effusion, right wrist: Secondary | ICD-10-CM | POA: Diagnosis not present

## 2016-01-27 DIAGNOSIS — M25531 Pain in right wrist: Secondary | ICD-10-CM | POA: Diagnosis not present

## 2016-02-03 DIAGNOSIS — M25431 Effusion, right wrist: Secondary | ICD-10-CM | POA: Diagnosis not present

## 2016-02-03 DIAGNOSIS — M25531 Pain in right wrist: Secondary | ICD-10-CM | POA: Diagnosis not present

## 2016-02-05 DIAGNOSIS — Z4789 Encounter for other orthopedic aftercare: Secondary | ICD-10-CM | POA: Diagnosis not present

## 2016-02-10 MED FILL — PANTOPRAZOLE SOD DR 40 MG T: 40 | 90 days supply | Qty: 90 | Fill #3

## 2016-02-10 MED FILL — valACYclovir HCL 1 GM TABS: 1 | 90 days supply | Qty: 90 | Fill #3

## 2016-02-10 MED FILL — ATENOLOL 50 MG TABLET: 50 | 90 days supply | Qty: 90 | Fill #3

## 2016-02-10 MED FILL — QUINAPRIL 40 MG TABLET: 40 | 90 days supply | Qty: 90 | Fill #3

## 2016-02-10 MED FILL — HYDROCHLOROTHIAZIDE 25 MG T: 25 | 90 days supply | Qty: 90 | Fill #3

## 2016-02-19 DIAGNOSIS — M25431 Effusion, right wrist: Secondary | ICD-10-CM | POA: Diagnosis not present

## 2016-02-19 DIAGNOSIS — M25531 Pain in right wrist: Secondary | ICD-10-CM | POA: Diagnosis not present

## 2016-03-02 DIAGNOSIS — M25531 Pain in right wrist: Secondary | ICD-10-CM | POA: Diagnosis not present

## 2016-03-02 DIAGNOSIS — M25431 Effusion, right wrist: Secondary | ICD-10-CM | POA: Diagnosis not present

## 2016-03-07 DIAGNOSIS — Z4789 Encounter for other orthopedic aftercare: Secondary | ICD-10-CM | POA: Diagnosis not present

## 2016-03-07 DIAGNOSIS — S63095D Other dislocation of left wrist and hand, subsequent encounter: Secondary | ICD-10-CM | POA: Diagnosis not present

## 2016-03-21 DIAGNOSIS — S63094S Other dislocation of right wrist and hand, sequela: Secondary | ICD-10-CM | POA: Diagnosis not present

## 2016-03-21 DIAGNOSIS — Z4789 Encounter for other orthopedic aftercare: Secondary | ICD-10-CM | POA: Diagnosis not present

## 2016-04-06 DIAGNOSIS — S63094S Other dislocation of right wrist and hand, sequela: Secondary | ICD-10-CM | POA: Diagnosis not present

## 2016-05-18 DIAGNOSIS — S63094S Other dislocation of right wrist and hand, sequela: Secondary | ICD-10-CM | POA: Diagnosis not present

## 2016-05-26 DIAGNOSIS — Z0000 Encounter for general adult medical examination without abnormal findings: Secondary | ICD-10-CM | POA: Diagnosis not present

## 2016-05-26 DIAGNOSIS — R739 Hyperglycemia, unspecified: Secondary | ICD-10-CM | POA: Diagnosis not present

## 2016-05-26 DIAGNOSIS — I10 Essential (primary) hypertension: Secondary | ICD-10-CM | POA: Diagnosis not present

## 2016-05-26 DIAGNOSIS — E785 Hyperlipidemia, unspecified: Secondary | ICD-10-CM | POA: Diagnosis not present

## 2016-06-06 DIAGNOSIS — Z8269 Family history of other diseases of the musculoskeletal system and connective tissue: Secondary | ICD-10-CM | POA: Diagnosis not present

## 2016-06-06 DIAGNOSIS — R0781 Pleurodynia: Secondary | ICD-10-CM | POA: Diagnosis not present

## 2016-06-06 DIAGNOSIS — Z8781 Personal history of (healed) traumatic fracture: Secondary | ICD-10-CM | POA: Diagnosis not present

## 2016-06-06 DIAGNOSIS — H6501 Acute serous otitis media, right ear: Secondary | ICD-10-CM | POA: Diagnosis not present

## 2016-06-06 DIAGNOSIS — Z0000 Encounter for general adult medical examination without abnormal findings: Secondary | ICD-10-CM | POA: Diagnosis not present

## 2016-06-06 DIAGNOSIS — R8290 Unspecified abnormal findings in urine: Secondary | ICD-10-CM | POA: Diagnosis not present

## 2016-06-06 DIAGNOSIS — K22719 Barrett's esophagus with dysplasia, unspecified: Secondary | ICD-10-CM | POA: Diagnosis not present

## 2016-06-06 DIAGNOSIS — I10 Essential (primary) hypertension: Secondary | ICD-10-CM | POA: Diagnosis not present

## 2016-06-06 DIAGNOSIS — E785 Hyperlipidemia, unspecified: Secondary | ICD-10-CM | POA: Diagnosis not present

## 2016-06-06 MED FILL — PANTOPRAZOLE SOD DR 40 MG T: 40 | 90 days supply | Qty: 90 | Fill #0

## 2016-06-06 MED FILL — QUINAPRIL 40 MG TABLET: 40 | 90 days supply | Qty: 90 | Fill #0

## 2016-06-06 MED FILL — FLUTICASONE PROP 50 MCG SPR: 50 | 60 days supply | Qty: 16 | Fill #0

## 2016-06-06 MED FILL — HYDROCHLOROTHIAZIDE 25 MG T: 25 | 90 days supply | Qty: 90 | Fill #0

## 2016-06-07 MED FILL — ATORVASTATIN 10 MG TABLET: 10 | 90 days supply | Qty: 90 | Fill #0

## 2016-06-13 MED FILL — ATENOLOL 50 MG TABLET: 50 | 90 days supply | Qty: 90 | Fill #0

## 2016-09-19 MED FILL — QUINAPRIL 40 MG TABLET: 40 | 90 days supply | Qty: 90 | Fill #1

## 2016-09-19 MED FILL — PANTOPRAZOLE SOD DR 40 MG T: 40 | 90 days supply | Qty: 90 | Fill #1

## 2016-09-19 MED FILL — ATENOLOL 50 MG TABLET: 50 | 30 days supply | Qty: 30 | Fill #1

## 2016-09-19 MED FILL — ATORVASTATIN 10 MG TABLET: 10 | 90 days supply | Qty: 90 | Fill #1

## 2016-09-19 MED FILL — HYDROCHLOROTHIAZIDE 25 MG T: 25 | 90 days supply | Qty: 90 | Fill #1

## 2016-10-31 MED FILL — ATENOLOL 50 MG TABLET: 50 | 30 days supply | Qty: 30 | Fill #2

## 2016-11-17 DIAGNOSIS — R079 Chest pain, unspecified: Secondary | ICD-10-CM | POA: Diagnosis not present

## 2016-11-17 DIAGNOSIS — R0789 Other chest pain: Secondary | ICD-10-CM | POA: Diagnosis not present

## 2016-11-17 MED FILL — CYCLOBENZAPRINE 10 MG TAB: 10 | 10 days supply | Qty: 30 | Fill #0

## 2016-11-17 MED FILL — DICLOFENAC SOD 75 MG TAB EC: 75 | 30 days supply | Qty: 60 | Fill #0

## 2016-12-07 MED FILL — ATENOLOL 50 MG TABLET: 50 | 30 days supply | Qty: 30 | Fill #3

## 2017-01-06 MED FILL — PANTOPRAZOLE SOD DR 40 MG T: 40 | 90 days supply | Qty: 90 | Fill #2

## 2017-01-06 MED FILL — HYDROCHLOROTHIAZIDE 25 MG T: 25 | 90 days supply | Qty: 90 | Fill #2

## 2017-01-06 MED FILL — QUINAPRIL 40 MG TABLET: 40 | 90 days supply | Qty: 90 | Fill #2

## 2017-01-06 MED FILL — ATENOLOL 50 MG TABLET: 50 | 30 days supply | Qty: 30 | Fill #4

## 2017-01-09 MED FILL — ATORVASTATIN 10 MG TABLET: 10 | 90 days supply | Qty: 90 | Fill #0

## 2017-02-06 MED FILL — ATENOLOL 50 MG TABLET: 50 | 90 days supply | Qty: 90 | Fill #5

## 2017-03-10 ENCOUNTER — Ambulatory Visit (INDEPENDENT_AMBULATORY_CARE_PROVIDER_SITE_OTHER): Payer: 59

## 2017-03-10 ENCOUNTER — Ambulatory Visit (HOSPITAL_COMMUNITY)
Admission: EM | Admit: 2017-03-10 | Discharge: 2017-03-10 | Disposition: A | Discharge status: Home / Self Care | Payer: 59 | Attending: Internal Medicine | Admitting: Internal Medicine

## 2017-03-10 ENCOUNTER — Encounter (HOSPITAL_COMMUNITY): Payer: Self-pay | Admitting: Family Medicine

## 2017-03-10 DIAGNOSIS — S93602A Unspecified sprain of left foot, initial encounter: Secondary | ICD-10-CM | POA: Diagnosis not present

## 2017-03-10 DIAGNOSIS — M79672 Pain in left foot: Secondary | ICD-10-CM | POA: Diagnosis not present

## 2017-03-10 DIAGNOSIS — S99922A Unspecified injury of left foot, initial encounter: Secondary | ICD-10-CM | POA: Diagnosis not present

## 2017-03-10 NOTE — Discharge Instructions (Signed)
Elevation, ice, limit weight bearing, USe crutches as needed for first few days.

## 2017-03-10 NOTE — ED Provider Notes (Signed)
CSN: [id number]     Arrival date & time 03/10/17  1428 History   First MD Initiated Contact with Patient 03/10/17 1522     Chief Complaint  Patient presents with  . Foot Injury   (Consider location/radiation/quality/duration/timing/severity/associated sxs/prior Treatment) 56 year old female states that she fell last night and twisted her left foot. She did not actually fall to the ground. The only injury is that to the lateral aspect of the left foot. Pain is primarily with weightbearing. Denies other injury.      Past Medical History:  Diagnosis Date  . Cerebral aneurysm 1999   Surgically coiled  . DDD (degenerative disc disease)   . GERD (gastroesophageal reflux disease)   . History of Barrett's esophagus   . Hypercholesterolemia   . Hypertension   . Irritable bowel disease    under control  . Sinus disease    Past Surgical History:  Procedure Laterality Date  . ABDOMINAL HYSTERECTOMY   2005   abd  . APPENDECTOMY    . ARTHROTOMY Right 12/11/2015   Procedure: OPEN ARTHROTOMY ;  Surgeon: Roseanne Kaufman, MD;  Location: [facility];  Service: Orthopedics;  Laterality: Right;  . BACK SURGERY     lumbar  . BREAST ENHANCEMENT SURGERY    . CEREBRAL ANEURYSM REPAIR  1999   coiled  . DIAGNOSTIC LAPAROSCOPY    . EYE SURGERY     lasik  . LUMBAR DISC SURGERY Left 06/03/2010   anterolateral decompression and arthrodesis L3-4, L4-5  . NASAL SINUS SURGERY  2013  . ORIF ANKLE FRACTURE  03/01/2012   Procedure: OPEN REDUCTION INTERNAL FIXATION (ORIF) ANKLE FRACTURE;  Surgeon: Wylene Simmer, MD;  Location: [place];  Service: Orthopedics;  Laterality: Right;  Open reduction internal fixation right navicular fracture  . SHOULDER ARTHROSCOPY     lt  . SYNOVECTOMY Right 12/11/2015   Procedure: SYNOVECTOMY DISTAL RADIOULNAR JOINT LOOSE BODY REMOVAL ;  Surgeon: Roseanne Kaufman, MD;  Location: [place];  Service: Orthopedics;  Laterality: Right;  .  WRIST ARTHROSCOPY Right 12/11/2015   Procedure: RIGHT WRIST ARTHROSCOPY WITH DEBRIDEMENT ;  Surgeon: Roseanne Kaufman, MD;  Location: [place] [facility];  Service: Orthopedics;  Laterality: Right;   Family History  Problem Relation Age of Onset  . Hypertension Father   . Hypertension Mother    Social History  Substance Use Topics  . Smoking status: Former Smoker    Quit date: 11/14/1984  . Smokeless tobacco: Never Used  . Alcohol use Yes     Comment: social   OB History    No data available     Review of Systems  Constitutional: Negative for activity change, chills and fever.  HENT: Negative.   Respiratory: Negative.   Cardiovascular: Negative.   Musculoskeletal:       As per HPI  Skin: Negative for color change, pallor and rash.  Neurological: Negative.     Allergies  Hydrocodone-acetaminophen; Morphine and related; and Tetracyclines & related  Home Medications   Prior to Admission medications   Medication Sig Start Date End Date Taking? Authorizing Provider  atenolol (TENORMIN) 50 MG tablet Take 50 mg by mouth at bedtime.    Historical Provider, MD  hydrochlorothiazide (HYDRODIURIL) 25 MG tablet Take 25 mg by mouth daily.    Historical Provider, MD  Omega-3 Fatty Acids (FISH OIL) 1000 MG CAPS Take by mouth.    Historical Provider, MD  pantoprazole (PROTONIX) 40 MG tablet Take 40 mg by mouth  daily.    Historical Provider, MD  quinapril (ACCUPRIL) 40 MG tablet Take 40 mg by mouth daily.    Historical Provider, MD  Specialty Vitamins Products (MAGNESIUM, AMINO ACID CHELATE,) 133 MG tablet Take 1 tablet by mouth 2 (two) times daily.    Historical Provider, MD  tapentadol (NUCYNTA) 50 MG TABS tablet Take 2 tablets (100 mg total) by mouth every 4 (four) hours as needed. 12/11/15   Roseanne Kaufman, MD   Meds Ordered and Administered this Visit  Medications - No data to display  BP 134/90   Pulse 70   Temp 98.4 F (36.9 C)   Resp 18   SpO2 100%  No data  found.   Physical Exam  Constitutional: She is oriented to person, place, and time. She appears well-developed and well-nourished. No distress.  HENT:  Head: Normocephalic and atraumatic.  Eyes: EOM are normal.  Neck: Neck supple.  Pulmonary/Chest: Effort normal.  Musculoskeletal: Normal range of motion. She exhibits no edema or deformity.  No apparent swelling to the left foot. No deformity. No bony tenderness to the ankle. Ankle with full range of motion. There is no swelling or visible evidence to the foot. Palpation of the foot does not elicit pain however when the patient places weight on the foot she is able to describe an area of pain that is moderate to severe to the lateral aspect along the fifth metatarsal. Again, no swelling or deformity. Pedal pulse 2+. Normal warmth and color. Distal neurovascular motor sensory grossly intact.  Lymphadenopathy:    She has no cervical adenopathy.  Neurological: She is alert and oriented to person, place, and time. No cranial nerve deficit.  Skin: Skin is warm and dry.  Psychiatric: She has a normal mood and affect.    Urgent Care Course     Procedures (including critical care time)  Labs Review Labs Reviewed - No data to display  Imaging Review Dg Foot Complete Left  Result Date: 03/10/2017 CLINICAL DATA:  [place] and twisted ankle. Left foot pain. Severe pain with walking. Pain is centered over the fifth metacarpal bone. EXAM: LEFT FOOT - COMPLETE 3+ VIEW COMPARISON:  None. FINDINGS: Negative for a fracture or dislocation. Alignment of the left foot is normal. There may be soft tissue swelling along the lateral aspect of the foot. IMPRESSION: No acute bone abnormality in left foot. Electronically Signed   By: Markus Daft M.D.   On: 03/10/2017 15:10     Visual Acuity Review  Right Eye Distance:   Left Eye Distance:   Bilateral Distance:    Right Eye Near:   Left Eye Near:    Bilateral Near:         MDM   1. Sprain of left  foot, initial encounter    Elevation, ice, limit weight bearing, USe crutches as needed for first few days.  Toe been wrapped for a few days and crutches provided.    Janne Napoleon, NP 03/10/17 [phone]

## 2017-03-10 NOTE — ED Triage Notes (Signed)
Pt here for left foot pain since a fall yesterday and she twisted it. sts she may have hear a pop but severe pain with walking and applying pressure.

## 2017-03-27 DIAGNOSIS — S93602A Unspecified sprain of left foot, initial encounter: Secondary | ICD-10-CM | POA: Diagnosis not present

## 2017-03-29 DIAGNOSIS — B009 Herpesviral infection, unspecified: Secondary | ICD-10-CM | POA: Diagnosis not present

## 2017-03-29 DIAGNOSIS — Z01419 Encounter for gynecological examination (general) (routine) without abnormal findings: Secondary | ICD-10-CM | POA: Diagnosis not present

## 2017-03-29 DIAGNOSIS — Z1272 Encounter for screening for malignant neoplasm of vagina: Secondary | ICD-10-CM | POA: Diagnosis not present

## 2017-03-29 MED FILL — valACYclovir HCL 1 GM TABS: 1 | 30 days supply | Qty: 90 | Fill #0

## 2017-04-03 MED FILL — ATORVASTATIN 10 MG TABLET: 10 | 90 days supply | Qty: 90 | Fill #1

## 2017-04-03 MED FILL — QUINAPRIL 40 MG TABLET: 40 | 90 days supply | Qty: 90 | Fill #3

## 2017-04-03 MED FILL — PANTOPRAZOLE SOD DR 40 MG T: 40 | 90 days supply | Qty: 90 | Fill #3

## 2017-04-03 MED FILL — HYDROCHLOROTHIAZIDE 25 MG T: 25 | 90 days supply | Qty: 90 | Fill #3

## 2017-04-18 DIAGNOSIS — S93602D Unspecified sprain of left foot, subsequent encounter: Secondary | ICD-10-CM | POA: Diagnosis not present

## 2017-05-03 MED FILL — ATENOLOL 50 MG TABLET: 50 | 60 days supply | Qty: 60 | Fill #6

## 2017-05-22 ENCOUNTER — Other Ambulatory Visit: Payer: Self-pay | Admitting: *Deleted

## 2017-05-22 ENCOUNTER — Ambulatory Visit (INDEPENDENT_AMBULATORY_CARE_PROVIDER_SITE_OTHER): Payer: 59 | Admitting: Family Medicine

## 2017-05-22 ENCOUNTER — Ambulatory Visit (INDEPENDENT_AMBULATORY_CARE_PROVIDER_SITE_OTHER)
Admission: RE | Admit: 2017-05-22 | Discharge: 2017-05-22 | Disposition: A | Discharge status: Home / Self Care | Payer: 59 | Source: Ambulatory Visit | Attending: Family Medicine | Admitting: Family Medicine

## 2017-05-22 ENCOUNTER — Encounter: Payer: Self-pay | Admitting: Family Medicine

## 2017-05-22 VITALS — BP 130/80 | HR 68 | Ht 69.0 in | Wt 147.0 lb

## 2017-05-22 DIAGNOSIS — S32120A Nondisplaced Zone II fracture of sacrum, initial encounter for closed fracture: Secondary | ICD-10-CM

## 2017-05-22 DIAGNOSIS — S3210XA Unspecified fracture of sacrum, initial encounter for closed fracture: Secondary | ICD-10-CM | POA: Insufficient documentation

## 2017-05-22 DIAGNOSIS — M5136 Other intervertebral disc degeneration, lumbar region: Secondary | ICD-10-CM | POA: Diagnosis not present

## 2017-05-22 DIAGNOSIS — M533 Sacrococcygeal disorders, not elsewhere classified: Secondary | ICD-10-CM | POA: Diagnosis not present

## 2017-05-22 DIAGNOSIS — S79912A Unspecified injury of left hip, initial encounter: Secondary | ICD-10-CM | POA: Diagnosis not present

## 2017-05-22 DIAGNOSIS — M25552 Pain in left hip: Secondary | ICD-10-CM | POA: Diagnosis not present

## 2017-05-22 MED ORDER — GABAPENTIN 100 MG PO CAPS: 200.0000 mg | ORAL_CAPSULE | Freq: Every day | ORAL | 3 refills | Status: DC

## 2017-05-22 MED ORDER — KETOROLAC TROMETHAMINE 60 MG/2ML IM SOLN
60.0000 mg | Freq: Once | INTRAMUSCULAR | Status: AC
  Administered 2017-05-22: 60 mg via INTRAMUSCULAR

## 2017-05-22 MED ORDER — PREDNISONE 50 MG PO TABS: 50.0000 mg | ORAL_TABLET | Freq: Every day | ORAL | 0 refills | Status: DC

## 2017-05-22 MED ORDER — VITAMIN D (ERGOCALCIFEROL) 1.25 MG (50000 UNIT) PO CAPS: 50000.0000 [IU] | ORAL_CAPSULE | ORAL | 0 refills | Status: DC

## 2017-05-22 MED ORDER — TRAMADOL HCL 50 MG PO TABS: 50.0000 mg | ORAL_TABLET | Freq: Three times a day (TID) | ORAL | 0 refills | Status: DC | PRN

## 2017-05-22 MED ORDER — METHYLPREDNISOLONE ACETATE 80 MG/ML IJ SUSP
80.0000 mg | Freq: Once | INTRAMUSCULAR | Status: AC
  Administered 2017-05-22: 80 mg via INTRAMUSCULAR

## 2017-05-22 MED FILL — GABAPENTIN 100 MG CAPSULE: 100 | 30 days supply | Qty: 60 | Fill #0

## 2017-05-22 MED FILL — VIT D2 1.25 MG (50,000 UNIT: 1.25 MG | 84 days supply | Qty: 12 | Fill #0

## 2017-05-22 MED FILL — predniSONE 50 MG TABS: 50 | 5 days supply | Qty: 5 | Fill #0

## 2017-05-22 NOTE — Assessment & Plan Note (Signed)
Abdomen the patient does have a left-sided sacral fracture. Seems to be in the zone 2.  Does not seem to be displaced. We discussed if no significant improvement worsening symptoms patient will need to have more advanced imaging including a CT scan or an MRI. Patient will try conservative therapy first. Once weekly vitamin D, prednisone given, gabapentin at night. Follow-up again in 2-3 weeks.

## 2017-05-22 NOTE — Progress Notes (Signed)
[name] [address] [place], [place] [address] Phone: [phone] Subjective:     CC:  Left lower back pain  WTU:[id number]  [name] [name] is a 56 y.o. female coming in with complaint of left lower back pain. Patient unfortunately 2 weeks ago fell off her horse. Fell on her right side. Did not have pain immediately except for more what felt like a muscle bruise. States that the next day woke up and had more pain on the left side. Over the course of the numerous days started having even difficulty actually ambulating. Had missed a couple days of work secondary to the pain. Patient has been working but by the end of that she is only able to ambulate with the aid of a crutch. Patient denies any radiation of the pain that seems to stay very localized into the left back as well as buttocks region.   patient did have x-rays taken today. X-rays were independently visualized by me showing the patient does have what appears to be a very small possible sacral fracture noted of the left sacrum. Nondisplaced.  Past Medical History:  Diagnosis Date  . Cerebral aneurysm 1999   Surgically coiled  . DDD (degenerative disc disease)   . GERD (gastroesophageal reflux disease)   . History of Barrett's esophagus   . Hypercholesterolemia   . Hypertension   . Irritable bowel disease    under control  . Sinus disease    Past Surgical History:  Procedure Laterality Date  . ABDOMINAL HYSTERECTOMY   2005   abd  . APPENDECTOMY    . ARTHROTOMY Right 12/11/2015   Procedure: OPEN ARTHROTOMY ;  Surgeon: Roseanne Kaufman, MD;  Location: [facility];  Service: Orthopedics;  Laterality: Right;  . BACK SURGERY     lumbar  . BREAST ENHANCEMENT SURGERY    . CEREBRAL ANEURYSM REPAIR  1999   coiled  . DIAGNOSTIC LAPAROSCOPY    . EYE SURGERY     lasik  . LUMBAR DISC SURGERY Left 06/03/2010   anterolateral decompression and arthrodesis L3-4, L4-5  . NASAL SINUS SURGERY   2013  . ORIF ANKLE FRACTURE  03/01/2012   Procedure: OPEN REDUCTION INTERNAL FIXATION (ORIF) ANKLE FRACTURE;  Surgeon: Wylene Simmer, MD;  Location: [facility];  Service: Orthopedics;  Laterality: Right;  Open reduction internal fixation right navicular fracture  . SHOULDER ARTHROSCOPY     lt  . SYNOVECTOMY Right 12/11/2015   Procedure: SYNOVECTOMY DISTAL RADIOULNAR JOINT LOOSE BODY REMOVAL ;  Surgeon: Roseanne Kaufman, MD;  Location: [facility];  Service: Orthopedics;  Laterality: Right;  . WRIST ARTHROSCOPY Right 12/11/2015   Procedure: RIGHT WRIST ARTHROSCOPY WITH DEBRIDEMENT ;  Surgeon: Roseanne Kaufman, MD;  Location: [place];  Service: Orthopedics;  Laterality: Right;   Social History   Social History  . Marital status: Divorced    Spouse name: N/A  . Number of children: N/A  . Years of education: N/A   Social History Main Topics  . Smoking status: Former Smoker    Quit date: 11/14/1984  . Smokeless tobacco: Never Used  . Alcohol use Yes     Comment: social  . Drug use: No  . Sexual activity: Not Asked   Other Topics Concern  . 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 Her daughter and 91-monthold granddaughter live with her. She does not smoke cigarettes.  Allergies  Allergen Reactions  . Hydrocodone-Acetaminophen Itching  . Morphine And Related Nausea Only    Nausea and vomitting  . Tetracyclines & Related     Break out on all extremties   Family History  Problem Relation Age of Onset  . Hypertension Father   . Hypertension Mother     Past medical history, social, surgical and family history all reviewed in electronic medical record.  No pertanent information unless stated regarding to the chief complaint.   Review of Systems:Review of systems updated and as accurate as of 05/22/17  No headache, visual changes, nausea, vomiting, diarrhea, constipation, dizziness,  abdominal pain, skin rash, fevers, chills, night sweats, weight loss, swollen lymph nodes, body aches, joint swelling,  chest pain, shortness of breath, mood changes.  Positive muscle aches  Objective  Blood pressure 130/80, pulse 68, height 5' 9"  (1.753 m), weight 147 lb (66.7 kg), SpO2 98 %. Systems examined below as of 05/22/17   General: No apparent distress alert and oriented x3 mood and affect normal, dressed appropriately.  HEENT: Pupils equal, extraocular movements intact  Respiratory: Patient's speak in full sentences and does not appear short of breath  Cardiovascular: No lower extremity edema, non tender, no erythema  Skin: Warm dry intact with no signs of infection or rash on extremities or on axial skeleton.  Abdomen: Soft nontender  Neuro: Cranial nerves II through XII are intact, neurovascularly intact in all extremities with 2+ DTRs and 2+ pulses.  Lymph: No lymphadenopathy of posterior or anterior cervical chain or axillae bilaterally.  Gait mild antalgic and cautious MSK:  Non tender with full range of motion and good stability and symmetric strength and tone of shoulders, elbows, wrist, hip, knee and ankles bilaterally.  Back Exam:  Inspection: Mild loss of lordosis Motion: Flexion 40 deg, Extension 15 deg, Side Bending to 25 deg bilaterally,  Rotation to 40 deg bilaterally  SLR laying: Negative  XSLR laying: Negative  Palpable tenderness: Tender to palpation severely over the left sacroiliac joint. FABER: Unable to do on the left side.. Sensory change: Gross sensation intact to all lumbar and sacral dermatomes.  Reflexes: 2+ at both patellar tendons, 2+ at achilles tendons, Babinski's downgoing.  Strength at foot  Plantar-flexion: 5/5 Dorsi-flexion: 5/5 Eversion: 5/5 Inversion: 5/5  Leg strength  Quad: 5/5 Hamstring: 5/5 Hip flexor: 5/5 Hip abductors: 4/5  Gait unremarkable.     Impression and Recommendations:     This case required medical decision making of  moderate complexity.      Note: This dictation was prepared with Dragon dictation along with smaller phrase technology. Any transcriptional errors that result from this process are unintentional.

## 2017-05-22 NOTE — Patient Instructions (Signed)
Good to see you.  Ice 20 minutes 2 times daily. Usually after activity and before bed. Prednisone daily starting tomorrow for 5 days Gabapentin 283m at night starting tonight Once weekly vitamin D to help with the healing.  Try to take it easy other then work.  See me again in 10-14 days (OK to double book).

## 2017-05-25 MED FILL — DICLOFENAC SOD 75 MG TAB EC: 75 | 30 days supply | Qty: 60 | Fill #1

## 2017-05-30 ENCOUNTER — Telehealth: Payer: Self-pay | Admitting: Family Medicine

## 2017-05-30 NOTE — Telephone Encounter (Signed)
Patient has called in stating that she will be given a parking permit to park a little closer to work entry but will only be able to park there for a few days.  Employer has suggest she get a handicap parking permit.  Patient would like to know if Dr. Tamala Julian could complete a parking permit form for her? Also, patient states that she is not feeling any better this week.  Would like to know Dr. Darliss Ridgel thoughts on this.  Patient would like to know if it could be b/c of continued working? Patient states she has lots of pain while walking.  Please follow up in regard.   Thanks!

## 2017-05-31 NOTE — Telephone Encounter (Signed)
Spoke with patient. Let her know that she can pick up handicap application from front desk.

## 2017-06-07 ENCOUNTER — Encounter: Payer: Self-pay | Admitting: Family Medicine

## 2017-06-07 ENCOUNTER — Ambulatory Visit (INDEPENDENT_AMBULATORY_CARE_PROVIDER_SITE_OTHER): Payer: 59 | Admitting: Family Medicine

## 2017-06-07 VITALS — BP 120/80 | HR 66 | Ht 69.0 in | Wt 167.0 lb

## 2017-06-07 DIAGNOSIS — M8448XA Pathological fracture, other site, initial encounter for fracture: Secondary | ICD-10-CM

## 2017-06-07 DIAGNOSIS — S32120D Nondisplaced Zone II fracture of sacrum, subsequent encounter for fracture with routine healing: Secondary | ICD-10-CM

## 2017-06-07 MED ORDER — ALENDRONATE SODIUM 70 MG PO TABS: 70.0000 mg | ORAL_TABLET | ORAL | 0 refills | Status: DC

## 2017-06-07 MED FILL — ALENDRONATE NA 70 MG TAB: 70 | 56 days supply | Qty: 8 | Fill #0

## 2017-06-07 NOTE — Patient Instructions (Addendum)
Good to see you  Ice 20 minutes 2 times daily. Usually after activity and before bed. Fosamax weekly for 8 weeks.  We will get bone density  See me again in 3-4 weeks.

## 2017-06-07 NOTE — Assessment & Plan Note (Signed)
Only mild improvement. Patient will be stent of Fosamax to aid in healing. Patient wants to continue working at this time. We discussed icing regimen and home exercises. Patient will come back and see me again in 2-4 weeks.

## 2017-06-07 NOTE — Progress Notes (Signed)
[name] [address] [place], [place] [address] Phone: [phone] Subjective:     CC:  Left lower back pain f/u  [id number]  [name] [name] is a 56 y.o. female coming in with complaint of left lower back pain. Patient didn't follow-up for ORIF. Patient's x-rays that show a sacral fracture that was nondisplaced. Patient has been on vitamin D. Continues to work but is having difficulty. States by the end of the day has difficulty. Patient tries to increase her stride length she has difficulty with pain. All of it seems to be mostly on the left sign.. Minimal intermittent pain down the leg. Denies any weakness. Would state that she is tender to 15% better.     Past Medical History:  Diagnosis Date  . Cerebral aneurysm 1999   Surgically coiled  . DDD (degenerative disc disease)   . GERD (gastroesophageal reflux disease)   . History of Barrett's esophagus   . Hypercholesterolemia   . Hypertension   . Irritable bowel disease    under control  . Sinus disease    Past Surgical History:  Procedure Laterality Date  . ABDOMINAL HYSTERECTOMY   2005   abd  . APPENDECTOMY    . ARTHROTOMY Right 12/11/2015   Procedure: OPEN ARTHROTOMY ;  Surgeon: Roseanne Kaufman, MD;  Location: [facility];  Service: Orthopedics;  Laterality: Right;  . BACK SURGERY     lumbar  . BREAST ENHANCEMENT SURGERY    . CEREBRAL ANEURYSM REPAIR  1999   coiled  . DIAGNOSTIC LAPAROSCOPY    . EYE SURGERY     lasik  . LUMBAR DISC SURGERY Left 06/03/2010   anterolateral decompression and arthrodesis L3-4, L4-5  . NASAL SINUS SURGERY  2013  . ORIF ANKLE FRACTURE  03/01/2012   Procedure: OPEN REDUCTION INTERNAL FIXATION (ORIF) ANKLE FRACTURE;  Surgeon: Wylene Simmer, MD;  Location: [facility];  Service: Orthopedics;  Laterality: Right;  Open reduction internal fixation right navicular fracture  . SHOULDER ARTHROSCOPY     lt  . SYNOVECTOMY Right 12/11/2015   Procedure: SYNOVECTOMY DISTAL RADIOULNAR JOINT LOOSE BODY REMOVAL ;  Surgeon: Roseanne Kaufman, MD;  Location: [facility];  Service: Orthopedics;  Laterality: Right;  . WRIST ARTHROSCOPY Right 12/11/2015   Procedure: RIGHT WRIST ARTHROSCOPY WITH DEBRIDEMENT ;  Surgeon: Roseanne Kaufman, MD;  Location: [facility];  Service: Orthopedics;  Laterality: Right;   Social History   Social History  . Marital status: Divorced    Spouse name: N/A  . Number of children: N/A  . Years of education: N/A   Social History Main Topics  . Smoking status: Former Smoker    Quit date: 11/14/1984  . Smokeless tobacco: Never Used  . Alcohol use Yes     Comment: social  . Drug use: No  . Sexual activity: Not Asked   Other Topics Concern  . 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 Her daughter and 46-monthold granddaughter live with her. She does not smoke cigarettes.   Allergies  Allergen Reactions  . Hydrocodone-Acetaminophen Itching  . Morphine And Related Nausea Only    Nausea and vomitting  . Tetracyclines & Related     Break out on all extremties   Family History  Problem Relation Age of Onset  . Hypertension Father   . Hypertension Mother     Past medical history, social, surgical  and family history all reviewed in electronic medical record.  No pertanent information unless stated regarding to the chief complaint.   Review of Systems: No headache, visual changes, nausea, vomiting, diarrhea, constipation, dizziness, abdominal pain, skin rash, fevers, chills, night sweats, weight loss, swollen lymph nodes, body aches, joint swelling, muscle aches, chest pain, shortness of breath, mood changes.    Objective  Blood pressure 120/80, pulse 66, height 5' 9"  (1.753 m), weight 167 lb (75.8 kg), SpO2 95 %.   Systems examined below as of 06/07/17 General: NAD A&O x3 mood, affect normal  HEENT: Pupils equal,  extraocular movements intact no nystagmus Respiratory: not short of breath at rest or with speaking Cardiovascular: No lower extremity edema, non tender Skin: Warm dry intact with no signs of infection or rash on extremities or on axial skeleton. Abdomen: Soft nontender, no masses Neuro: Cranial nerves  intact, neurovascularly intact in all extremities with 2+ DTRs and 2+ pulses. Lymph: No lymphadenopathy appreciated today  Gait Still favoring in the left side  MSK: Non tender with full range of motion and good stability and symmetric strength and tone of shoulders, elbows, wrist,  knee hips and ankles bilaterally.   Back Exam:  Inspection: Loss of lordosis Motion: Flexion 40 deg, Extension 15 deg worsening pain, Side Bending to 25 deg bilaterally,  Rotation to 40 deg bilaterally  SLR laying: Negative  XSLR laying: Negative  Palpable tenderness: Tender to palpation severely over the left sacroiliac joint. About the same as previous exam FABER: Unable to do it because significant pain.. Sensory change: Gross sensation intact to all lumbar and sacral dermatomes.  Reflexes: 2+ at both patellar tendons, 2+ at achilles tendons, Babinski's downgoing.  Strength at foot  Plantar-flexion: 5/5 Dorsi-flexion: 5/5 Eversion: 5/5 Inversion: 5/5  Leg strength  Quad: 5/5 Hamstring: 5/5 Hip flexor: 5/5 Hip abductors: 4/5       Impression and Recommendations:     This case required medical decision making of moderate complexity.      Note: This dictation was prepared with Dragon dictation along with smaller phrase technology. Any transcriptional errors that result from this process are unintentional.

## 2017-06-12 ENCOUNTER — Ambulatory Visit (INDEPENDENT_AMBULATORY_CARE_PROVIDER_SITE_OTHER)
Admission: RE | Admit: 2017-06-12 | Discharge: 2017-06-12 | Disposition: A | Discharge status: Home / Self Care | Payer: 59 | Source: Ambulatory Visit | Attending: Internal Medicine | Admitting: Internal Medicine

## 2017-06-12 DIAGNOSIS — M8448XA Pathological fracture, other site, initial encounter for fracture: Secondary | ICD-10-CM | POA: Diagnosis not present

## 2017-07-04 ENCOUNTER — Ambulatory Visit: Payer: Self-pay

## 2017-07-04 ENCOUNTER — Encounter: Payer: Self-pay | Admitting: Family Medicine

## 2017-07-04 ENCOUNTER — Ambulatory Visit (INDEPENDENT_AMBULATORY_CARE_PROVIDER_SITE_OTHER): Payer: 59 | Admitting: Family Medicine

## 2017-07-04 VITALS — BP 140/98 | HR 64 | Ht 69.0 in | Wt 166.0 lb

## 2017-07-04 DIAGNOSIS — M25512 Pain in left shoulder: Secondary | ICD-10-CM | POA: Diagnosis not present

## 2017-07-04 DIAGNOSIS — M7502 Adhesive capsulitis of left shoulder: Secondary | ICD-10-CM | POA: Diagnosis not present

## 2017-07-04 DIAGNOSIS — G8929 Other chronic pain: Secondary | ICD-10-CM

## 2017-07-04 DIAGNOSIS — S32120D Nondisplaced Zone II fracture of sacrum, subsequent encounter for fracture with routine healing: Secondary | ICD-10-CM

## 2017-07-04 DIAGNOSIS — M7500 Adhesive capsulitis of unspecified shoulder: Secondary | ICD-10-CM | POA: Insufficient documentation

## 2017-07-04 MED ORDER — VITAMIN D (ERGOCALCIFEROL) 1.25 MG (50000 UNIT) PO CAPS: 50000.0000 [IU] | ORAL_CAPSULE | ORAL | 0 refills | Status: DC

## 2017-07-04 NOTE — Assessment & Plan Note (Signed)
Frozen shoulder noted. Home exercises given, work with Product/process development scientist, we discussed continuing the once weekly vitamin D, we discussed increasing the range of motion. Declined injection or formal physical therapy today. Follow-up with me again in 4 weeks.

## 2017-07-04 NOTE — Progress Notes (Signed)
[name] [address] [place], [place] [address] Phone: [phone] Subjective:    I'm seeing this patient by the request  of:    CC:   [id number]  [name] [name] is a 56 y.o. female coming in with complaint of left shoulder pain. She has had a clavial resection and a bone spur removed a decade ago. No new injury but she is having pain in that shoulder again with overhead movements. The pain has increased the past 3 weeks. She has been going to massage therapy but that has not decreased her pain.   No problems with her back.  Onset- 3 weeks Location-left Duration- intermittent Character- dull Aggravating factors- overhead motions Reliving factors- staying beneath shoulder level Therapies tried-  Severity-      Past Medical History:  Diagnosis Date  . Cerebral aneurysm 1999   Surgically coiled  . DDD (degenerative disc disease)   . GERD (gastroesophageal reflux disease)   . History of Barrett's esophagus   . Hypercholesterolemia   . Hypertension   . Irritable bowel disease    under control  . Sinus disease    Past Surgical History:  Procedure Laterality Date  . ABDOMINAL HYSTERECTOMY   2005   abd  . APPENDECTOMY    . ARTHROTOMY Right 12/11/2015   Procedure: OPEN ARTHROTOMY ;  Surgeon: Roseanne Kaufman, MD;  Location: [facility];  Service: Orthopedics;  Laterality: Right;  . BACK SURGERY     lumbar  . BREAST ENHANCEMENT SURGERY    . CEREBRAL ANEURYSM REPAIR  1999   coiled  . DIAGNOSTIC LAPAROSCOPY    . EYE SURGERY     lasik  . LUMBAR DISC SURGERY Left 06/03/2010   anterolateral decompression and arthrodesis L3-4, L4-5  . NASAL SINUS SURGERY  2013  . ORIF ANKLE FRACTURE  03/01/2012   Procedure: OPEN REDUCTION INTERNAL FIXATION (ORIF) ANKLE FRACTURE;  Surgeon: Wylene Simmer, MD;  Location: [facility];  Service: Orthopedics;  Laterality: Right;  Open reduction internal fixation right navicular fracture  .  SHOULDER ARTHROSCOPY     lt  . SYNOVECTOMY Right 12/11/2015   Procedure: SYNOVECTOMY DISTAL RADIOULNAR JOINT LOOSE BODY REMOVAL ;  Surgeon: Roseanne Kaufman, MD;  Location: [facility];  Service: Orthopedics;  Laterality: Right;  . WRIST ARTHROSCOPY Right 12/11/2015   Procedure: RIGHT WRIST ARTHROSCOPY WITH DEBRIDEMENT ;  Surgeon: Roseanne Kaufman, MD;  Location: [facility];  Service: Orthopedics;  Laterality: Right;   Social History   Social History  . Marital status: Divorced    Spouse name: N/A  . Number of children: N/A  . Years of education: N/A   Social History Main Topics  . Smoking status: Former Smoker    Quit date: 11/14/1984  . Smokeless tobacco: Never Used  . Alcohol use Yes     Comment: social  . Drug use: No  . Sexual activity: Not on file   Other Topics Concern  . Not on file   Social History Narrative   The patient works as a Marine scientist in the electrophysiology department. Her daughter and 92-monthold granddaughter live with her. She does not smoke cigarettes.   Allergies  Allergen Reactions  . Hydrocodone-Acetaminophen Itching  . Morphine And Related Nausea Only    Nausea and vomitting  . Tetracyclines & Related     Break out on all extremties   Family History  Problem Relation Age of Onset  . Hypertension Father   .  Hypertension Mother      Past medical history, social, surgical and family history all reviewed in electronic medical record.  No pertanent information unless stated regarding to the chief complaint.   Review of Systems:Review of systems updated and as accurate as of 07/04/17  No headache, visual changes, nausea, vomiting, diarrhea, constipation, dizziness, abdominal pain, skin rash, fevers, chills, night sweats, weight loss, swollen lymph nodes, body aches, joint swelling, muscle aches, chest pain, shortness of breath, mood changes.   Objective  There were no vitals taken for this visit. Systems examined below as of  07/04/17   General: No apparent distress alert and oriented x3 mood and affect normal, dressed appropriately.  HEENT: Pupils equal, extraocular movements intact  Respiratory: Patient's speak in full sentences and does not appear short of breath  Cardiovascular: No lower extremity edema, non tender, no erythema  Skin: Warm dry intact with no signs of infection or rash on extremities or on axial skeleton.  Abdomen: Soft nontender  Neuro: Cranial nerves II through XII are intact, neurovascularly intact in all extremities with 2+ DTRs and 2+ pulses.  Lymph: No lymphadenopathy of posterior or anterior cervical chain or axillae bilaterally.  Gait normal with good balance and coordination.  MSK:  Non tender with full range of motion and good stability and symmetric strength and tone of shoulders, elbows, wrist, hip, knee and ankles bilaterally.     Impression and Recommendations:     This case required medical decision making of moderate complexity.      Note: This dictation was prepared with Dragon dictation along with smaller phrase technology. Any transcriptional errors that result from this process are unintentional.

## 2017-07-04 NOTE — Progress Notes (Signed)
[name] [address] [place], [place] [address] Phone: [phone] Subjective:     CC:  Left lower back pain f/u   Left shoulder pain Found to have more  [id number]  YESMIN MUTCH is a 56 y.o. female coming in with complaint of left lower back pain. Done have a sacroiliac fracture. Feeling significantly better since she has been on the Fosamax. No pain whatsoever. Has increase activity. This being very active.   Patient was having unfortunately ew-onset of left shoulder pain. Has had a history of a distal clavicle resection on the left side previously.Patient does not remember any true injury. Rates the severity pain is 6 out of 10 and worsening. Waking her up at night. Rates the severity of pain a 7 out of 10  atient denies any radiation pain. Seems to stay localized because from anterior to posterior aspect of the shoulder    Past Medical History:  Diagnosis Date  . Cerebral aneurysm 1999   Surgically coiled  . DDD (degenerative disc disease)   . GERD (gastroesophageal reflux disease)   . History of Barrett's esophagus   . Hypercholesterolemia   . Hypertension   . Irritable bowel disease    under control  . Sinus disease    Past Surgical History:  Procedure Laterality Date  . ABDOMINAL HYSTERECTOMY   2005   abd  . APPENDECTOMY    . ARTHROTOMY Right 12/11/2015   Procedure: OPEN ARTHROTOMY ;  Surgeon: Roseanne Kaufman, MD;  Location: [facility];  Service: Orthopedics;  Laterality: Right;  . BACK SURGERY     lumbar  . BREAST ENHANCEMENT SURGERY    . CEREBRAL ANEURYSM REPAIR  1999   coiled  . DIAGNOSTIC LAPAROSCOPY    . EYE SURGERY     lasik  . LUMBAR DISC SURGERY Left 06/03/2010   anterolateral decompression and arthrodesis L3-4, L4-5  . NASAL SINUS SURGERY  2013  . ORIF ANKLE FRACTURE  03/01/2012   Procedure: OPEN REDUCTION INTERNAL FIXATION (ORIF) ANKLE FRACTURE;  Surgeon: Wylene Simmer, MD;  Location: [facility];  Service: Orthopedics;  Laterality: Right;  Open reduction internal fixation right navicular fracture  . SHOULDER ARTHROSCOPY     lt  . SYNOVECTOMY Right 12/11/2015   Procedure: SYNOVECTOMY DISTAL RADIOULNAR JOINT LOOSE BODY REMOVAL ;  Surgeon: Roseanne Kaufman, MD;  Location: [facility];  Service: Orthopedics;  Laterality: Right;  . WRIST ARTHROSCOPY Right 12/11/2015   Procedure: RIGHT WRIST ARTHROSCOPY WITH DEBRIDEMENT ;  Surgeon: Roseanne Kaufman, MD;  Location: [facility];  Service: Orthopedics;  Laterality: Right;   Social History   Social History  . Marital status: Divorced    Spouse name: N/A  . Number of children: N/A  . Years of education: N/A   Social History Main Topics  . Smoking status: Former Smoker    Quit date: 11/14/1984  . Smokeless tobacco: Never Used  . Alcohol use Yes     Comment: social  . Drug use: No  . Sexual activity: Not on file   Other Topics Concern  . Not on file   Social History Narrative   The patient works as a Marine scientist in the electrophysiology department. Her daughter and 25-monthold granddaughter live with her. She does not smoke cigarettes.   Allergies  Allergen Reactions  . Hydrocodone-Acetaminophen Itching  . Morphine And Related Nausea Only    Nausea and vomitting  . Tetracyclines & Related  Break out on all extremties   Family History  Problem Relation Age of Onset  . Hypertension Father   . Hypertension Mother     Past medical history, social, surgical and family history all reviewed in electronic medical record.  No pertanent information unless stated regarding to the chief complaint.   Review of Systems: No headache, visual changes, nausea, vomiting, diarrhea, constipation, dizziness, abdominal pain, skin rash, fevers, chills, night sweats, weight loss, swollen lymph nodes, body aches, joint swelling, muscle aches, chest pain, shortness of breath, mood changes.     Objective  There  were no vitals taken for this visit.   Systems examined below as of 07/04/17 General: NAD A&O x3 mood, affect normal  HEENT: Pupils equal, extraocular movements intact no nystagmus Respiratory: not short of breath at rest or with speaking Cardiovascular: No lower extremity edema, non tender Skin: Warm dry intact with no signs of infection or rash on extremities or on axial skeleton. Abdomen: Soft nontender, no masses Neuro: Cranial nerves  intact, neurovascularly intact in all extremities with 2+ DTRs and 2+ pulses. Lymph: No lymphadenopathy appreciated today  Gait normal with good balance and coordination.  MSK: Non tender with full range of motion and good stability and symmetric strength and tone of  elbows, wrist,  knee hips and ankles bilaterally.   Back Exam:  Inspection: Loss of lordosis Motion: Flexion 40 deg, Extension 5 which is an improvement Side Bending to 35 deg bilaterally,  Rotation to 40 deg bilaterally  SLR laying: Negative  XSLR laying: Negative  Palpable tenderness: ild discomfort still over the sacroiliac joint on the left but very minimal FABER: ightness but able to do Sensory change: Gross sensation intact to all lumbar and sacral dermatomes.  Reflexes: 2+ at both patellar tendons, 2+ at achilles tendons, Babinski's downgoing.  Strength at foot  Plantar-flexion: 5/5 Dorsi-flexion: 5/5 Eversion: 5/5 Inversion: 5/5  Leg strength  Quad: 5/5 Hamstring: 5/5 Hip flexor: 5/5 Hip abductors: 4+/5   Shoulder: left Inspection reveals ost surgical changes Pain over the bicipital groove flexion of170,internal rotation to sacrum, external rotation of 10. Rotator cuff strength normal throughout. signs of impingement with positive Neer and Hawkin's tests, but negative empty can sign. Speeds and Yergason's tests normal. ositive O'Brien's Normal scapular function observed. No painful arc and no drop arm sign. No apprehension sign  MSK US performed of: left This study  was ordered, performed, and interpreted by Charlann Boxer D.O.  Shoulder:   Supraspinatus:  Appears normal on long and transverse views, .hickening of the capsule noted Infraspinatus:  Appears normal on long and transverse views. Significant increase in Doppler flow Subscapularis:  Appears normal on long and transverse views. Positive bursa Teres Minor:  Appears normal on long and transverse views. AC joint:  Ost surgical changes noted Glenohumeral Joint:  Appears normal without effusion. Glenoid Labrum:  ild calcific changes noted. Biceps Tendon:  Appears normal on long and transverse views, no fraying of tendon, tendon located in intertubercular groove, no subluxation with shoulder internal or external rotation.  Impression: mild subacromial bursitis with possible early adhesive capsulitis  [id number]; 15 additional minutes spent for Therapeutic exercises as stated in above notes.  This included exercises focusing on stretching, strengthening, with significant focus on eccentric aspects.   Long term goals include an improvement in range of motion, strength, endurance as well as avoiding reinjury. Patient's frequency would include in 1-2 times a day, 3-5 times a week for a duration of 6-12 weeks.  Shoulder  Exercises that included:  Basic scapular stabilization to include adduction and depression of scapula Scaption, focusing on proper movement and good control Internal and External rotation utilizing a theraband, with elbow tucked at side entire time Rows with theraband which was given  Proper technique shown and discussed handout in great detail with ATC.  All questions were discussed and answered.      Impression and Recommendations:     This case required medical decision making of moderate complexity.      Note: This dictation was prepared with Dragon dictation along with smaller phrase technology. Any transcriptional errors that result from this process are unintentional.

## 2017-07-04 NOTE — Assessment & Plan Note (Signed)
Significant improvement. Finish up the Fosamax is weak. Continue the once weekly vitamin D, discussed icing regimen and home exercises.

## 2017-07-04 NOTE — Patient Instructions (Signed)
Good to see you  Ice 20 minutes 2 times daily. Usually after activity and before bed. pennsaid pinkie amount topically 2 times daily as needed.   Exercises 3 times a week.  Try to push the shoulder some.  Once weekly vitamin D for 12 weeks.  Iron 73m with 5051mof vitamin C daily or 3 times a week if constipation  Stop fosamax after this one.  See me again in 4 weeks.

## 2017-07-10 MED FILL — ATENOLOL 50 MG TABLET: 50 | 90 days supply | Qty: 90 | Fill #0

## 2017-07-10 MED FILL — PANTOPRAZOLE SOD DR 40 MG T: 40 | 90 days supply | Qty: 90 | Fill #0

## 2017-07-10 MED FILL — valACYclovir HCL 1 GM TABS: 1 | 30 days supply | Qty: 90 | Fill #1

## 2017-07-10 MED FILL — QUINAPRIL 40 MG TABLET: 40 | 90 days supply | Qty: 90 | Fill #0

## 2017-07-10 MED FILL — ATORVASTATIN 10 MG TABLET: 10 | 90 days supply | Qty: 90 | Fill #0

## 2017-07-10 MED FILL — HYDROCHLOROTHIAZIDE 25 MG T: 25 | 90 days supply | Qty: 90 | Fill #0

## 2017-07-31 ENCOUNTER — Other Ambulatory Visit: Payer: Self-pay | Admitting: Internal Medicine

## 2017-07-31 ENCOUNTER — Ambulatory Visit: Payer: 59 | Admitting: Family Medicine

## 2017-07-31 DIAGNOSIS — Z1231 Encounter for screening mammogram for malignant neoplasm of breast: Secondary | ICD-10-CM

## 2017-08-16 ENCOUNTER — Encounter: Payer: Self-pay | Admitting: Family Medicine

## 2017-08-16 ENCOUNTER — Ambulatory Visit
Admission: RE | Admit: 2017-08-16 | Discharge: 2017-08-16 | Disposition: A | Discharge status: Home / Self Care | Payer: 59 | Source: Ambulatory Visit | Attending: Internal Medicine | Admitting: Internal Medicine

## 2017-08-16 ENCOUNTER — Ambulatory Visit (INDEPENDENT_AMBULATORY_CARE_PROVIDER_SITE_OTHER): Payer: 59 | Admitting: Family Medicine

## 2017-08-16 DIAGNOSIS — M7502 Adhesive capsulitis of left shoulder: Secondary | ICD-10-CM | POA: Diagnosis not present

## 2017-08-16 DIAGNOSIS — Z1231 Encounter for screening mammogram for malignant neoplasm of breast: Secondary | ICD-10-CM

## 2017-08-16 MED ORDER — VITAMIN D (ERGOCALCIFEROL) 1.25 MG (50000 UNIT) PO CAPS: 50000.0000 [IU] | ORAL_CAPSULE | ORAL | 0 refills | Status: DC

## 2017-08-16 MED FILL — VIT D2 1.25 MG (50,000 UNIT: 1.25 MG | 84 days supply | Qty: 12 | Fill #0

## 2017-08-16 NOTE — Assessment & Plan Note (Signed)
Near completely resolved at this time. Discussed with patient about continuing the home exercises in the once weekly vitamin D which she fell today. Follow-up as needed

## 2017-08-16 NOTE — Progress Notes (Signed)
[name] [address] [place], [place] [address] Phone: [phone] Subjective:    I'm seeing this patient by the request  of:    CC: Shoulder follow up  [id number]  [name] [name] is a 56 y.o. female coming in with complaint of shoulder pain. Found to have what appeared to be more of a frozen shoulder syndrome. Patient was to increase activity slowly. Patient states 95% better. Still some different aching pain that seems to be more intermittent.     Past Medical History:  Diagnosis Date  . Cerebral aneurysm 1999   Surgically coiled  . DDD (degenerative disc disease)   . GERD (gastroesophageal reflux disease)   . History of Barrett's esophagus   . Hypercholesterolemia   . Hypertension   . Irritable bowel disease    under control  . Sinus disease    Past Surgical History:  Procedure Laterality Date  . ABDOMINAL HYSTERECTOMY   2005   abd  . APPENDECTOMY    . ARTHROTOMY Right 12/11/2015   Procedure: OPEN ARTHROTOMY ;  Surgeon: Roseanne Kaufman, MD;  Location: [facility];  Service: Orthopedics;  Laterality: Right;  . BACK SURGERY     lumbar  . BREAST ENHANCEMENT SURGERY    . CEREBRAL ANEURYSM REPAIR  1999   coiled  . DIAGNOSTIC LAPAROSCOPY    . EYE SURGERY     lasik  . LUMBAR DISC SURGERY Left 06/03/2010   anterolateral decompression and arthrodesis L3-4, L4-5  . NASAL SINUS SURGERY  2013  . ORIF ANKLE FRACTURE  03/01/2012   Procedure: OPEN REDUCTION INTERNAL FIXATION (ORIF) ANKLE FRACTURE;  Surgeon: Wylene Simmer, MD;  Location: [facility];  Service: Orthopedics;  Laterality: Right;  Open reduction internal fixation right navicular fracture  . SHOULDER ARTHROSCOPY     lt  . SYNOVECTOMY Right 12/11/2015   Procedure: SYNOVECTOMY DISTAL RADIOULNAR JOINT LOOSE BODY REMOVAL ;  Surgeon: Roseanne Kaufman, MD;  Location: [facility];  Service: Orthopedics;  Laterality: Right;  . WRIST ARTHROSCOPY Right  12/11/2015   Procedure: RIGHT WRIST ARTHROSCOPY WITH DEBRIDEMENT ;  Surgeon: Roseanne Kaufman, MD;  Location: [facility];  Service: Orthopedics;  Laterality: Right;   Social History   Social History  . Marital status: Divorced    Spouse name: N/A  . Number of children: N/A  . Years of education: N/A   Social History Main Topics  . Smoking status: Former Smoker    Quit date: 11/14/1984  . Smokeless tobacco: Never Used  . Alcohol use Yes     Comment: social  . Drug use: No  . Sexual activity: Not on file   Other Topics Concern  . Not on file   Social History Narrative   The patient works as a Marine scientist in the electrophysiology department. Her daughter and 69-monthold granddaughter live with her. She does not smoke cigarettes.   Allergies  Allergen Reactions  . Hydrocodone-Acetaminophen Itching  . Morphine And Related Nausea Only    Nausea and vomitting  . Tetracyclines & Related     Break out on all extremties   Family History  Problem Relation Age of Onset  . Hypertension Father   . Hypertension Mother      Past medical history, social, surgical and family history all reviewed in electronic medical record.  No pertanent information unless stated regarding to the chief complaint.   Review of Systems:Review of systems updated and as accurate as of  08/16/17  No headache, visual changes, nausea, vomiting, diarrhea, constipation, dizziness, abdominal pain, skin rash, fevers, chills, night sweats, weight loss, swollen lymph nodes, body aches, joint swelling, muscle aches, chest pain, shortness of breath, mood changes.   Objective  There were no vitals taken for this visit. Systems examined below as of 08/16/17   General: No apparent distress alert and oriented x3 mood and affect normal, dressed appropriately.  HEENT: Pupils equal, extraocular movements intact  Respiratory: Patient's speak in full sentences and does not appear short of breath  Cardiovascular: No  lower extremity edema, non tender, no erythema  Skin: Warm dry intact with no signs of infection or rash on extremities or on axial skeleton.  Abdomen: Soft nontender  Neuro: Cranial nerves II through XII are intact, neurovascularly intact in all extremities with 2+ DTRs and 2+ pulses.  Lymph: No lymphadenopathy of posterior or anterior cervical chain or axillae bilaterally.  Gait normal with good balance and coordination.  MSK:  Non tender with full range of motion and good stability and symmetric strength and tone of shoulders, elbows, wrist, hip, knee and ankles bilaterally.  Shoulder: Left Inspection reveals no abnormalities, atrophy or asymmetry. Palpation is normal with no tenderness over AC joint or bicipital groove. ROM is full except lacking the last 5 of extension Rotator cuff strength normal throughout. Very mild impingement noted Speeds and Yergason's tests normal. No labral pathology noted with negative Obrien's, negative clunk and good stability. Normal scapular function observed. No painful arc and no drop arm sign. No apprehension sign       Impression and Recommendations:     This case required medical decision making of moderate complexity.      Note: This dictation was prepared with Dragon dictation along with smaller phrase technology. Any transcriptional errors that result from this process are unintentional.

## 2017-08-16 NOTE — Patient Instructions (Addendum)
Good to see you  Overall not bad.  Ice is your friend.  Stay active.  Vitamin D was called in Briscoe Deutscher at [facility] [place].  Mina Marble at [facility] See me when you need me!

## 2017-09-01 ENCOUNTER — Ambulatory Visit (INDEPENDENT_AMBULATORY_CARE_PROVIDER_SITE_OTHER): Payer: 59 | Admitting: Family Medicine

## 2017-09-01 ENCOUNTER — Encounter: Payer: Self-pay | Admitting: Family Medicine

## 2017-09-01 VITALS — BP 126/78 | HR 69 | Temp 98.4°F | Ht 69.0 in | Wt 167.0 lb

## 2017-09-01 DIAGNOSIS — Z23 Encounter for immunization: Secondary | ICD-10-CM

## 2017-09-01 DIAGNOSIS — I671 Cerebral aneurysm, nonruptured: Secondary | ICD-10-CM | POA: Insufficient documentation

## 2017-09-01 DIAGNOSIS — K589 Irritable bowel syndrome without diarrhea: Secondary | ICD-10-CM | POA: Insufficient documentation

## 2017-09-01 DIAGNOSIS — R5383 Other fatigue: Secondary | ICD-10-CM

## 2017-09-01 DIAGNOSIS — K219 Gastro-esophageal reflux disease without esophagitis: Secondary | ICD-10-CM | POA: Insufficient documentation

## 2017-09-01 DIAGNOSIS — K279 Peptic ulcer, site unspecified, unspecified as acute or chronic, without hemorrhage or perforation: Secondary | ICD-10-CM | POA: Insufficient documentation

## 2017-09-01 DIAGNOSIS — K2270 Barrett's esophagus without dysplasia: Secondary | ICD-10-CM | POA: Insufficient documentation

## 2017-09-01 DIAGNOSIS — Z0000 Encounter for general adult medical examination without abnormal findings: Secondary | ICD-10-CM

## 2017-09-01 DIAGNOSIS — E785 Hyperlipidemia, unspecified: Secondary | ICD-10-CM | POA: Diagnosis not present

## 2017-09-01 LAB — CBC WITH DIFFERENTIAL/PLATELET
Basophils Absolute: 0 10*3/uL (ref 0.0–0.1)
Basophils Relative: 0.9 % (ref 0.0–3.0)
Eosinophils Absolute: 0.1 10*3/uL (ref 0.0–0.7)
Eosinophils Relative: 1.8 % (ref 0.0–5.0)
HCT: 38.6 % (ref 36.0–46.0)
Hemoglobin: 13 g/dL (ref 12.0–15.0)
Lymphocytes Relative: 31.3 % (ref 12.0–46.0)
Lymphs Abs: 1.3 10*3/uL (ref 0.7–4.0)
MCHC: 33.8 g/dL (ref 30.0–36.0)
MCV: 99.7 fl (ref 78.0–100.0)
Monocytes Absolute: 0.4 10*3/uL (ref 0.1–1.0)
Monocytes Relative: 10.6 % (ref 3.0–12.0)
Neutro Abs: 2.3 10*3/uL (ref 1.4–7.7)
Neutrophils Relative %: 55.4 % (ref 43.0–77.0)
Platelets: 197 10*3/uL (ref 150.0–400.0)
RBC: 3.88 Mil/uL (ref 3.87–5.11)
RDW: 12.6 % (ref 11.5–15.5)
WBC: 4.1 10*3/uL (ref 4.0–10.5)

## 2017-09-01 LAB — COMPREHENSIVE METABOLIC PANEL
ALT: 427 U/L — ABNORMAL HIGH (ref 0–35)
AST: 290 U/L — ABNORMAL HIGH (ref 0–37)
Albumin: 4.4 g/dL (ref 3.5–5.2)
Alkaline Phosphatase: 90 U/L (ref 39–117)
BUN: 18 mg/dL (ref 6–23)
CO2: 34 mEq/L — ABNORMAL HIGH (ref 19–32)
Calcium: 9.5 mg/dL (ref 8.4–10.5)
Chloride: 99 mEq/L (ref 96–112)
Creatinine, Ser: 0.54 mg/dL (ref 0.40–1.20)
GFR: 123.15 mL/min (ref >60.00)
Glucose, Bld: 102 mg/dL — ABNORMAL HIGH (ref 70–99)
Potassium: 4 mEq/L (ref 3.5–5.1)
Sodium: 140 mEq/L (ref 135–145)
Total Bilirubin: 0.6 mg/dL (ref 0.2–1.2)
Total Protein: 7.7 g/dL (ref 6.0–8.3)

## 2017-09-01 LAB — LIPID PANEL
Cholesterol: 186 mg/dL (ref 0–200)
HDL: 58.7 mg/dL (ref >39.00)
LDL Cholesterol: 104 mg/dL — ABNORMAL HIGH (ref 0–99)
NonHDL: 127.3
Total CHOL/HDL Ratio: 3
Triglycerides: 116 mg/dL (ref 0.0–149.0)
VLDL: 23.2 mg/dL (ref 0.0–40.0)

## 2017-09-01 LAB — T4, FREE: Free T4: 0.98 ng/dL (ref 0.60–1.60)

## 2017-09-01 LAB — VITAMIN B12: Vitamin B-12: 682 pg/mL (ref 211–911)

## 2017-09-01 LAB — TSH: TSH: 1.39 u[IU]/mL (ref 0.35–4.50)

## 2017-09-01 MED ORDER — PANTOPRAZOLE SODIUM 40 MG PO TBEC: 40.0000 mg | DELAYED_RELEASE_TABLET | Freq: Every day | ORAL | 3 refills | Status: DC

## 2017-09-01 MED ORDER — HYDROCHLOROTHIAZIDE 25 MG PO TABS: 25.0000 mg | ORAL_TABLET | Freq: Every day | ORAL | 3 refills | Status: DC

## 2017-09-01 MED ORDER — LOSARTAN POTASSIUM 50 MG PO TABS: 50.0000 mg | ORAL_TABLET | Freq: Every day | ORAL | 0 refills | Status: DC

## 2017-09-01 MED ORDER — ATENOLOL 50 MG PO TABS: 50.0000 mg | ORAL_TABLET | Freq: Every day | ORAL | 3 refills | Status: DC

## 2017-09-01 MED ORDER — ATORVASTATIN CALCIUM 10 MG PO TABS: 10.0000 mg | ORAL_TABLET | Freq: Every day | ORAL | 3 refills | Status: DC

## 2017-09-01 MED FILL — LOSARTAN POTASSIUM 50 MG TA: 50 | 90 days supply | Qty: 90 | Fill #0

## 2017-09-05 ENCOUNTER — Encounter (HOSPITAL_COMMUNITY): Payer: Self-pay | Admitting: Emergency Medicine

## 2017-09-05 ENCOUNTER — Ambulatory Visit (HOSPITAL_COMMUNITY)
Admission: EM | Admit: 2017-09-05 | Discharge: 2017-09-05 | Disposition: A | Discharge status: Home / Self Care | Payer: 59 | Attending: Emergency Medicine | Admitting: Emergency Medicine

## 2017-09-05 DIAGNOSIS — B961 Klebsiella pneumoniae [K. pneumoniae] as the cause of diseases classified elsewhere: Secondary | ICD-10-CM | POA: Diagnosis not present

## 2017-09-05 DIAGNOSIS — R3915 Urgency of urination: Secondary | ICD-10-CM

## 2017-09-05 DIAGNOSIS — Z1611 Resistance to penicillins: Secondary | ICD-10-CM | POA: Insufficient documentation

## 2017-09-05 DIAGNOSIS — N3000 Acute cystitis without hematuria: Secondary | ICD-10-CM | POA: Insufficient documentation

## 2017-09-05 DIAGNOSIS — R350 Frequency of micturition: Secondary | ICD-10-CM | POA: Diagnosis present

## 2017-09-05 LAB — POCT URINALYSIS DIP (DEVICE)
Bilirubin Urine: NEGATIVE
GLUCOSE, UA: NEGATIVE mg/dL
Hgb urine dipstick: NEGATIVE
NITRITE: POSITIVE — AB
PROTEIN: NEGATIVE mg/dL
Specific Gravity, Urine: 1.025 (ref 1.005–1.030)
Urobilinogen, UA: 0.2 mg/dL (ref 0.0–1.0)
pH: 5.5 (ref 5.0–8.0)

## 2017-09-05 MED ORDER — CEPHALEXIN 500 MG PO CAPS: 500.0000 mg | ORAL_CAPSULE | Freq: Four times a day (QID) | ORAL | 0 refills | Status: DC

## 2017-09-05 MED FILL — CEPHALEXIN 500 MG CAPSULE: 500 | 7 days supply | Qty: 28 | Fill #0

## 2017-09-05 NOTE — ED Triage Notes (Signed)
For the last 1-2 weeks has felt like she was getting a uti.  Today patient feels the urge to urinate.  Slight burn with urination.

## 2017-09-05 NOTE — ED Provider Notes (Signed)
[facility]    CSN: [id number] Arrival date & time: 09/05/17  1214     History   Chief Complaint Chief Complaint  Patient presents with  . Recurrent UTI    HPI [name] [name] is a 56 y.o. female.   57 year old female with intermittent symptoms for about a week of decreased urine volume, urgency, bladder discomfort after urinating. Fever chills, flank pain or GI symptoms.      Past Medical History:  Diagnosis Date  . Cerebral aneurysm 1999   Surgically coiled  . DDD (degenerative disc disease)   . GERD (gastroesophageal reflux disease)   . History of Barrett's esophagus   . Hypercholesterolemia   . Hypertension   . Irritable bowel disease   . Sinus disease     Patient Active Problem List   Diagnosis Date Noted  . Barrett esophagus 09/01/2017  . Cerebral aneurysm 09/01/2017  . GERD (gastroesophageal reflux disease) 09/01/2017  . Hyperlipidemia 09/01/2017  . IBS (irritable bowel syndrome) 09/01/2017  . PUD (peptic ulcer disease) 09/01/2017  . Routine physical examination 09/01/2017  . Fatigue 09/01/2017  . 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 Sacral fracture ([facility]) 05/22/2017  . Chest pressure 01/10/2012  . Benign hypertension 01/10/2012    Past Surgical History:  Procedure Laterality Date  . ABDOMINAL HYSTERECTOMY  2005  . APPENDECTOMY    . ARTHROTOMY Right 12/11/2015   Procedure: OPEN ARTHROTOMY ;  Surgeon: Roseanne Kaufman, MD;  Location: [facility];  Service: Orthopedics;  Laterality: Right;  . AUGMENTATION MAMMAPLASTY Bilateral   . BREAST ENHANCEMENT SURGERY    . CEREBRAL ANEURYSM REPAIR  1999  . DIAGNOSTIC LAPAROSCOPY    . LUMBAR DISC SURGERY Left 06/03/2010   Procedure: ANTEROLATERAL DECOMPRESSION AND ARTHRODESIS L3-4, L4-5  . NASAL SINUS SURGERY  2013  . ORIF ANKLE FRACTURE  03/01/2012   Procedure: OPEN REDUCTION INTERNAL FIXATION (ORIF) ANKLE FRACTURE;  Surgeon: Wylene Simmer, MD;  Location: [facility];   Service: Orthopedics;  Laterality: Right;  Open reduction internal fixation right navicular fracture  . REFRACTIVE SURGERY    . SHOULDER ARTHROSCOPY Left   . SYNOVECTOMY Right 12/11/2015   Procedure: SYNOVECTOMY DISTAL RADIOULNAR JOINT LOOSE BODY REMOVAL ;  Surgeon: Roseanne Kaufman, MD;  Location: [facility];  Service: Orthopedics;  Laterality: Right;  . WRIST ARTHROSCOPY Right 12/11/2015   Procedure: RIGHT WRIST ARTHROSCOPY WITH DEBRIDEMENT ;  Surgeon: Roseanne Kaufman, MD;  Location: [facility];  Service: Orthopedics;  Laterality: Right;    OB History    No data available       Home Medications    Prior to Admission medications   Medication Sig Start Date End Date Taking? Authorizing Provider  atenolol (TENORMIN) 50 MG tablet Take 1 tablet (50 mg total) by mouth at bedtime. 09/01/17   Briscoe Deutscher, DO  atorvastatin (LIPITOR) 10 MG tablet Take 1 tablet (10 mg total) by mouth daily. 09/01/17   Briscoe Deutscher, DO  Calcium Citrate-Vitamin D 315-250 MG-UNIT TABS Take 1 tablet by mouth 2 (two) times daily.    [provider]  cephALEXin (KEFLEX) 500 MG capsule Take 1 capsule (500 mg total) by mouth 4 (four) times daily. 09/05/17   Janne Napoleon, NP  diclofenac (VOLTAREN) 75 MG EC tablet Take 75 mg by mouth 2 (two) times daily.    [provider]  gabapentin (NEURONTIN) 100 MG capsule Take 2 capsules (200 mg total) by mouth at bedtime. 05/22/17   Lyndal Pulley,  DO  hydrochlorothiazide (HYDRODIURIL) 25 MG tablet Take 1 tablet (25 mg total) by mouth daily. 09/01/17   Briscoe Deutscher, DO  losartan (COZAAR) 50 MG tablet Take 1 tablet (50 mg total) by mouth daily. 09/01/17   Briscoe Deutscher, DO  Multiple Vitamins-Minerals (FORMULA TWENTY-ONE PO) Take 1 capsule by mouth.    [provider]  Omega-3 Fatty Acids (FISH OIL) 1000 MG CAPS Take 1,400 mg by mouth.     [provider]  pantoprazole (PROTONIX) 40 MG tablet Take 1 tablet (40 mg  total) by mouth daily. 09/01/17   Briscoe Deutscher, DO  quinapril (ACCUPRIL) 40 MG tablet Take 40 mg by mouth daily.    [provider]  valACYclovir (VALTREX) 500 MG tablet Take 500 mg by mouth 2 (two) times daily.    [provider]  Vitamin D, Ergocalciferol, (DRISDOL) 50000 units CAPS capsule Take 1 capsule (50,000 Units total) by mouth every 7 (seven) days. 08/16/17   Lyndal Pulley, DO    Family History Family History  Problem Relation Age of Onset  . Hypertension Father   . Hypertension Mother     Social History Social History  Substance Use Topics  . Smoking status: Former Smoker    Quit date: 11/14/1984  . Smokeless tobacco: Never Used  . Alcohol use Yes     Comment: social     Allergies   Hydrocodone-acetaminophen; Morphine and related; and Tetracyclines & related   Review of Systems Review of Systems  Constitutional: Negative.   Genitourinary: Negative for menstrual problem and vaginal bleeding.       Per history of present illness  Musculoskeletal: Negative.   Neurological: Negative.   All other systems reviewed and are negative.    Physical Exam Triage Vital Signs ED Triage Vitals  Enc Vitals Group     BP 09/05/17 1257 116/71     Pulse Rate 09/05/17 1257 89     Resp 09/05/17 1257 18     Temp 09/05/17 1257 98.4 F (36.9 C)     Temp Source 09/05/17 1257 Oral     SpO2 09/05/17 1257 97 %     Weight --      Height --      Head Circumference --      Peak Flow --      Pain Score 09/05/17 1254 2     Pain Loc --      Pain Edu? --      Excl. in [place]? --    No data found.   Updated Vital Signs BP 116/71 (BP Location: Left Arm)   Pulse 89   Temp 98.4 F (36.9 C) (Oral)   Resp 18   SpO2 97%   Visual Acuity Right Eye Distance:   Left Eye Distance:   Bilateral Distance:    Right Eye Near:   Left Eye Near:    Bilateral Near:     Physical Exam  Constitutional: She is oriented to person, place, and time. She appears  well-developed and well-nourished. No distress.  Eyes: EOM are normal.  Neck: Neck supple.  Cardiovascular: Normal rate.   Pulmonary/Chest: Effort normal. No respiratory distress.  Musculoskeletal: She exhibits no edema.  Neurological: She is alert and oriented to person, place, and time. She exhibits normal muscle tone.  Skin: Skin is warm and dry.  Psychiatric: She has a normal mood and affect.  Nursing note and vitals reviewed.    UC Treatments / Results  Labs (all labs ordered are  listed, but only abnormal results are displayed) Labs Reviewed  POCT URINALYSIS DIP (DEVICE) - Abnormal; Notable for the following:       Result Value   Ketones, ur TRACE (*)    Nitrite POSITIVE (*)    Leukocytes, UA SMALL (*)    All other components within normal limits    EKG  EKG Interpretation None       Radiology No results found.  Procedures Procedures (including critical care time)  Medications Ordered in UC Medications - No data to display   Initial Impression / Assessment and Plan / UC Course  I have reviewed the triage vital signs and the nursing notes.  Pertinent labs & imaging results that were available during my care of the patient were reviewed by me and considered in my medical decision making (see chart for details).    ntibiotics as directed. Drink plenty of water. AZO standard, phenazopyridine    Final Clinical Impressions(s) / UC Diagnoses   Final diagnoses:  Acute cystitis without hematuria    New Prescriptions New Prescriptions   CEPHALEXIN (KEFLEX) 500 MG CAPSULE    Take 1 capsule (500 mg total) by mouth 4 (four) times daily.     Controlled Substance Prescriptions [place] Controlled Substance Registry consulted? Not Applicable   Janne Napoleon, NP 09/05/17 1359

## 2017-09-05 NOTE — Discharge Instructions (Signed)
Antibiotics as directed. Drink plenty of water. AZO standard, phenazopyridine

## 2017-09-07 ENCOUNTER — Telehealth: Payer: Self-pay | Admitting: Family Medicine

## 2017-09-07 ENCOUNTER — Other Ambulatory Visit: Payer: Self-pay | Admitting: *Deleted

## 2017-09-07 DIAGNOSIS — R748 Abnormal levels of other serum enzymes: Secondary | ICD-10-CM

## 2017-09-07 NOTE — Telephone Encounter (Signed)
Patient asking for lab results, asking for return call when possible. "[phone]" given for call back as she is at work.

## 2017-09-07 NOTE — Telephone Encounter (Signed)
Patient was at lunch. LM for patient to return call.

## 2017-09-08 LAB — URINE CULTURE: Special Requests: NORMAL

## 2017-09-08 NOTE — Telephone Encounter (Signed)
Per lab result note, patient has been informed of her lab results.

## 2017-09-09 ENCOUNTER — Telehealth: Payer: Self-pay

## 2017-09-09 NOTE — Telephone Encounter (Signed)
Post ED Visit - Positive Culture Follow-up  Culture report reviewed by antimicrobial stewardship pharmacist:  []  Elenor Quinones, Pharm.D. []  Heide Guile, Pharm.D., BCPS AQ-ID []  Parks Neptune, Pharm.D., BCPS []  Alycia Rossetti, Pharm.D., BCPS []  [place], [place].D., BCPS, AAHIVP []  Legrand Como, Pharm.D., BCPS, AAHIVP []  Salome Arnt, PharmD, BCPS []  Dimitri Ped, PharmD, BCPS []  Vincenza Hews, PharmD, BCPS [facility] D Positive urine culture Treated with Cephalexin, organism sensitive to the same and no further patient follow-up is required at this time.  Genia Del 09/09/2017, 12:05 PM

## 2017-09-09 NOTE — Progress Notes (Signed)
Subjective:    [name] [name] is a 56 y.o. female and is here for a comprehensive physical exam.  Pertinent Gynecological History: No LMP recorded. Patient has had a hysterectomy.  Health Maintenance Due  Topic Date Due  . Hepatitis C Screening  05-04-1961  . HIV Screening  06/17/1974  . PAP SMEAR  06/17/1980  . COLONOSCOPY  06/17/2009  . INFLUENZA VACCINE  06/14/2017   PMHx, SurgHx, SocialHx, Medications, and Allergies were reviewed in the Visit Navigator and updated as appropriate.   Past Medical History:  Diagnosis Date  . Cerebral aneurysm 1999   Surgically coiled  . DDD (degenerative disc disease)   . GERD (gastroesophageal reflux disease)   . History of Barrett's esophagus   . Hypercholesterolemia   . Hypertension   . Irritable bowel disease   . Sinus disease    Past Surgical History:  Procedure Laterality Date  . ABDOMINAL HYSTERECTOMY  2005  . APPENDECTOMY    . ARTHROTOMY Right 12/11/2015   Procedure: OPEN ARTHROTOMY ;  Surgeon: Roseanne Kaufman, MD;  Location: [facility];  Service: Orthopedics;  Laterality: Right;  . AUGMENTATION MAMMAPLASTY Bilateral   . BREAST ENHANCEMENT SURGERY    . CEREBRAL ANEURYSM REPAIR  1999  . DIAGNOSTIC LAPAROSCOPY    . LUMBAR DISC SURGERY Left 06/03/2010   Procedure: ANTEROLATERAL DECOMPRESSION AND ARTHRODESIS L3-4, L4-5  . NASAL SINUS SURGERY  2013  . ORIF ANKLE FRACTURE  03/01/2012   Procedure: OPEN REDUCTION INTERNAL FIXATION (ORIF) ANKLE FRACTURE;  Surgeon: Wylene Simmer, MD;  Location: [facility];  Service: Orthopedics;  Laterality: Right;  Open reduction internal fixation right navicular fracture  . REFRACTIVE SURGERY    . SHOULDER ARTHROSCOPY Left   . SYNOVECTOMY Right 12/11/2015   Procedure: SYNOVECTOMY DISTAL RADIOULNAR JOINT LOOSE BODY REMOVAL ;  Surgeon: Roseanne Kaufman, MD;  Location: [facility];  Service: Orthopedics;  Laterality: Right;  . WRIST ARTHROSCOPY Right 12/11/2015   Procedure: RIGHT WRIST ARTHROSCOPY WITH DEBRIDEMENT ;  Surgeon: Roseanne Kaufman, MD;  Location: [facility];  Service: Orthopedics;  Laterality: Right;   Family History  Problem Relation Age of Onset  . Hypertension Father   . Hypertension Mother    Social History  Substance Use Topics  . Smoking status: Former Smoker    Quit date: 11/14/1984  . Smokeless tobacco: Never Used  . Alcohol use Yes     Comment: social   Review of Systems:   Pertinent items are noted in the HPI. Otherwise, ROS is negative.  Objective:   BP 126/78   Pulse 69   Temp 98.4 F (36.9 C) (Oral)   Ht 5' 9"  (1.753 m)   Wt 167 lb (75.8 kg)   SpO2 97%   BMI 24.66 kg/m   Wt Readings from Last 3 Encounters:  09/01/17 167 lb (75.8 kg)  08/16/17 165 lb (74.8 kg)  07/04/17 166 lb (75.3 kg)     Ht Readings from Last 3 Encounters:  09/01/17 5' 9"  (1.753 m)  08/16/17 5' 9"  (1.753 m)  07/04/17 5' 9"  (1.753 m)   General appearance: alert, cooperative and appears stated age. Head: normocephalic, without obvious abnormality, atraumatic. Neck: no adenopathy, supple, symmetrical, trachea midline; thyroid not enlarged, symmetric, no tenderness/mass/nodules. Lungs: clear to auscultation bilaterally. Heart: regular rate and rhythm Abdomen: soft, non-tender; no masses,  no organomegaly. Extremities: extremities normal, atraumatic, no cyanosis or edema. Skin: skin color, texture, turgor normal, no rashes or lesions. Lymph: cervical, supraclavicular,  and axillary nodes normal; no abnormal inguinal nodes palpated. Neurologic: grossly normal.   Assessment/Plan:   [name] was seen today for establish care.  Diagnoses and all orders for this visit:  Hyperlipidemia, unspecified hyperlipidemia type -     atorvastatin (LIPITOR) 10 MG tablet; Take 1 tablet (10 mg total) by mouth daily. -     Lipid panel  Routine physical examination -     CBC with Differential/Platelet -     Comprehensive metabolic  panel -     TSH -     T4, free -     Vitamin B12  Fatigue, unspecified type -     TSH -     T4, free -     Vitamin B12  Need for shingles vaccine -     Varicella-zoster vaccine IM (Shingrix)  Other orders -     Cancel: quinapril (ACCUPRIL) 40 MG tablet; Take 1 tablet (40 mg total) by mouth daily. -     atenolol (TENORMIN) 50 MG tablet; Take 1 tablet (50 mg total) by mouth at bedtime. -     hydrochlorothiazide (HYDRODIURIL) 25 MG tablet; Take 1 tablet (25 mg total) by mouth daily. -     losartan (COZAAR) 50 MG tablet; Take 1 tablet (50 mg total) by mouth daily. -     pantoprazole (PROTONIX) 40 MG tablet; Take 1 tablet (40 mg total) by mouth daily.   Patient Counseling: [x]    Nutrition: Stressed importance of moderation in sodium/caffeine intake, saturated fat and cholesterol, caloric balance, sufficient intake of fresh fruits, vegetables, fiber, calcium, iron, and 1 mg of folate supplement per day (for females capable of pregnancy).  [x]    Stressed the importance of regular exercise.   [x]    Substance Abuse: Discussed cessation/primary prevention of tobacco, alcohol, or other drug use; driving or other dangerous activities under the influence; availability of treatment for abuse.   [x]    Injury prevention: Discussed safety belts, safety helmets, smoke detector, smoking near bedding or upholstery.   [x]    Sexuality: Discussed sexually transmitted diseases, partner selection, use of condoms, avoidance of unintended pregnancy  and contraceptive alternatives.  [x]    Dental health: Discussed importance of regular tooth brushing, flossing, and dental visits.  [x]    Health maintenance and immunizations reviewed. Please refer to Health maintenance section.   Briscoe Deutscher, DO [facility]

## 2017-09-12 ENCOUNTER — Other Ambulatory Visit (INDEPENDENT_AMBULATORY_CARE_PROVIDER_SITE_OTHER): Payer: 59

## 2017-09-12 DIAGNOSIS — R748 Abnormal levels of other serum enzymes: Secondary | ICD-10-CM

## 2017-09-12 LAB — COMPREHENSIVE METABOLIC PANEL
ALT: 364 U/L — ABNORMAL HIGH (ref 0–35)
AST: 245 U/L — ABNORMAL HIGH (ref 0–37)
Albumin: 4.8 g/dL (ref 3.5–5.2)
Alkaline Phosphatase: 80 U/L (ref 39–117)
BUN: 12 mg/dL (ref 6–23)
CO2: 31 mEq/L (ref 19–32)
Calcium: 10.5 mg/dL (ref 8.4–10.5)
Chloride: 96 mEq/L (ref 96–112)
Creatinine, Ser: 0.56 mg/dL (ref 0.40–1.20)
GFR: 118.08 mL/min (ref >60.00)
Glucose, Bld: 101 mg/dL — ABNORMAL HIGH (ref 70–99)
Potassium: 3.4 mEq/L — ABNORMAL LOW (ref 3.5–5.1)
Sodium: 135 mEq/L (ref 135–145)
Total Bilirubin: 0.9 mg/dL (ref 0.2–1.2)
Total Protein: 8.3 g/dL (ref 6.0–8.3)

## 2017-09-17 NOTE — Addendum Note (Signed)
Addended by: Briscoe Deutscher R on: 09/17/2017 08:38 PM   Modules accepted: Orders

## 2017-09-21 ENCOUNTER — Telehealth: Payer: Self-pay | Admitting: Family Medicine

## 2017-09-21 NOTE — Telephone Encounter (Signed)
ROI faxed to [facility]

## 2017-09-28 ENCOUNTER — Ambulatory Visit
Admission: RE | Admit: 2017-09-28 | Discharge: 2017-09-28 | Disposition: A | Discharge status: Home / Self Care | Payer: 59 | Source: Ambulatory Visit | Attending: Family Medicine | Admitting: Family Medicine

## 2017-09-28 DIAGNOSIS — R748 Abnormal levels of other serum enzymes: Secondary | ICD-10-CM

## 2017-09-28 DIAGNOSIS — R7989 Other specified abnormal findings of blood chemistry: Secondary | ICD-10-CM | POA: Diagnosis not present

## 2017-10-03 ENCOUNTER — Telehealth: Payer: Self-pay | Admitting: Family Medicine

## 2017-10-03 NOTE — Telephone Encounter (Signed)
Patient says she was not advised to stop taking atorvastatin. Please advise.

## 2017-10-04 NOTE — Telephone Encounter (Signed)
Spoke with patient.  She states she was not aware she was supposed to stop taking her statin.  She just stopped taking it today (10/04/2017).  She wants to know when she should have her labs repeated.  She had an appointment scheduled for 10/18/2017 for a follow up from her ultrasound and was expecting labs then but feels that will be too soon after stopping Lipitor.  She cancelled that appointment.  She would like to know when she should come back in to be seen.  Please advise and patient will be glad to schedule an appointment.

## 2017-10-08 NOTE — Telephone Encounter (Signed)
It is reasonable to push everything back those 6 weeks.

## 2017-10-10 NOTE — Telephone Encounter (Signed)
Patient ok to schedule appointment in 6 weeks for follow up and labs.  Needs to be fasting.  Could you please call to schedule her?  Thank you!

## 2017-10-12 NOTE — Telephone Encounter (Signed)
Attempted to call patient. Left VM for patient to call back and schedule.

## 2017-10-18 ENCOUNTER — Other Ambulatory Visit: Payer: Self-pay | Admitting: Family Medicine

## 2017-10-18 ENCOUNTER — Ambulatory Visit: Payer: 59 | Admitting: Family Medicine

## 2017-10-18 MED FILL — HYDROCHLOROTHIAZIDE 25 MG T: 25 | 90 days supply | Qty: 90 | Fill #0

## 2017-10-18 MED FILL — ATENOLOL 50 MG TABLET: 50 | 90 days supply | Qty: 90 | Fill #0

## 2017-10-18 MED FILL — PANTOPRAZOLE SOD DR 40 MG T: 40 | 90 days supply | Qty: 90 | Fill #0

## 2017-10-18 MED FILL — DICLOFENAC SODIUM 75 MG TAB: 75 | 30 days supply | Qty: 60 | Fill #2

## 2017-10-18 MED FILL — valACYclovir HCL 1 GM TABS: 1 | 30 days supply | Qty: 90 | Fill #2

## 2017-10-23 MED FILL — VIT D2 1.25 MG (50,000 UNIT: 1.25 MG | 84 days supply | Qty: 12 | Fill #0

## 2017-11-21 ENCOUNTER — Encounter: Payer: Self-pay | Admitting: Family Medicine

## 2017-11-21 ENCOUNTER — Ambulatory Visit (INDEPENDENT_AMBULATORY_CARE_PROVIDER_SITE_OTHER): Payer: 59 | Admitting: Family Medicine

## 2017-11-21 VITALS — BP 132/84 | HR 78 | Temp 98.6°F | Wt 163.6 lb

## 2017-11-21 DIAGNOSIS — R05 Cough: Secondary | ICD-10-CM

## 2017-11-21 DIAGNOSIS — R300 Dysuria: Secondary | ICD-10-CM

## 2017-11-21 DIAGNOSIS — K760 Fatty (change of) liver, not elsewhere classified: Secondary | ICD-10-CM | POA: Diagnosis not present

## 2017-11-21 DIAGNOSIS — R945 Abnormal results of liver function studies: Secondary | ICD-10-CM | POA: Diagnosis not present

## 2017-11-21 DIAGNOSIS — E782 Mixed hyperlipidemia: Secondary | ICD-10-CM | POA: Diagnosis not present

## 2017-11-21 DIAGNOSIS — R7989 Other specified abnormal findings of blood chemistry: Secondary | ICD-10-CM

## 2017-11-21 DIAGNOSIS — R059 Cough, unspecified: Secondary | ICD-10-CM

## 2017-11-21 LAB — CBC WITH DIFFERENTIAL/PLATELET
Basophils Absolute: 0 10*3/uL (ref 0.0–0.1)
Basophils Relative: 0.6 % (ref 0.0–3.0)
Eosinophils Absolute: 0.1 10*3/uL (ref 0.0–0.7)
Eosinophils Relative: 1.3 % (ref 0.0–5.0)
HCT: 38.8 % (ref 36.0–46.0)
Hemoglobin: 13.1 g/dL (ref 12.0–15.0)
Lymphocytes Relative: 27.9 % (ref 12.0–46.0)
Lymphs Abs: 1.8 10*3/uL (ref 0.7–4.0)
MCHC: 33.9 g/dL (ref 30.0–36.0)
MCV: 99.1 fl (ref 78.0–100.0)
Monocytes Absolute: 0.6 10*3/uL (ref 0.1–1.0)
Monocytes Relative: 8.7 % (ref 3.0–12.0)
Neutro Abs: 4 10*3/uL (ref 1.4–7.7)
Neutrophils Relative %: 61.5 % (ref 43.0–77.0)
Platelets: 249 10*3/uL (ref 150.0–400.0)
RBC: 3.92 Mil/uL (ref 3.87–5.11)
RDW: 11.9 % (ref 11.5–15.5)
WBC: 6.6 10*3/uL (ref 4.0–10.5)

## 2017-11-21 LAB — COMPREHENSIVE METABOLIC PANEL
ALT: 57 U/L — ABNORMAL HIGH (ref 0–35)
AST: 53 U/L — ABNORMAL HIGH (ref 0–37)
Albumin: 4.4 g/dL (ref 3.5–5.2)
Alkaline Phosphatase: 74 U/L (ref 39–117)
BUN: 14 mg/dL (ref 6–23)
CO2: 33 mEq/L — ABNORMAL HIGH (ref 19–32)
Calcium: 9.6 mg/dL (ref 8.4–10.5)
Chloride: 97 mEq/L (ref 96–112)
Creatinine, Ser: 0.58 mg/dL (ref 0.40–1.20)
GFR: 113.32 mL/min (ref >60.00)
Glucose, Bld: 99 mg/dL (ref 70–99)
Potassium: 4.1 mEq/L (ref 3.5–5.1)
Sodium: 138 mEq/L (ref 135–145)
Total Bilirubin: 0.6 mg/dL (ref 0.2–1.2)
Total Protein: 7.4 g/dL (ref 6.0–8.3)

## 2017-11-21 LAB — URINALYSIS, ROUTINE W REFLEX MICROSCOPIC
Bilirubin Urine: NEGATIVE
Hgb urine dipstick: NEGATIVE
Ketones, ur: NEGATIVE
Nitrite: NEGATIVE
RBC / HPF: NONE SEEN (ref 0–?)
Specific Gravity, Urine: 1.02 (ref 1.000–1.030)
Total Protein, Urine: NEGATIVE
Urine Glucose: NEGATIVE
Urobilinogen, UA: 0.2 (ref 0.0–1.0)
pH: 6 (ref 5.0–8.0)

## 2017-11-21 LAB — LDL CHOLESTEROL, DIRECT: Direct LDL: 146 mg/dL

## 2017-11-21 LAB — LIPID PANEL
Cholesterol: 253 mg/dL — ABNORMAL HIGH (ref 0–200)
HDL: 45.1 mg/dL (ref >39.00)
NonHDL: 207.81
Total CHOL/HDL Ratio: 6
Triglycerides: 290 mg/dL — ABNORMAL HIGH (ref 0.0–149.0)
VLDL: 58 mg/dL — ABNORMAL HIGH (ref 0.0–40.0)

## 2017-11-21 MED ORDER — BUDESONIDE-FORMOTEROL FUMARATE 80-4.5 MCG/ACT IN AERO: 2.0000 | INHALATION_SPRAY | Freq: Two times a day (BID) | RESPIRATORY_TRACT | 3 refills | Status: DC

## 2017-11-21 MED ORDER — CEFTRIAXONE SODIUM 500 MG IJ SOLR
500.0000 mg | Freq: Once | INTRAMUSCULAR | Status: AC
  Administered 2017-11-21: 500 mg via INTRAMUSCULAR

## 2017-11-21 MED ORDER — LEVOFLOXACIN 500 MG PO TABS: 500.0000 mg | ORAL_TABLET | Freq: Every day | ORAL | 0 refills | Status: DC

## 2017-11-21 MED FILL — levoFLOXacin 500 MG TABS: 500 | 7 days supply | Qty: 7 | Fill #0

## 2017-11-21 NOTE — Progress Notes (Signed)
[name] [name] is a 57 y.o. female here for an acute visit.  History of Present Illness:   Lonell Grandchild, CMA acting as scribe for Dr. Briscoe Deutscher.   Cough  This is a new problem. The current episode started 1 to 4 weeks ago. The problem has been unchanged. The cough is productive of sputum. Associated symptoms include nasal congestion. Pertinent negatives include no chills, fever or headaches. The symptoms are aggravated by lying down. The treatment provided no relief. There is no history of asthma, COPD or emphysema.  Dysuria   This is a recurrent problem. The current episode started more than 1 month ago. The problem occurs intermittently. The problem has been unchanged. The quality of the pain is described as burning. The pain is at a severity of 4/10. The pain is mild. There has been no fever. Pertinent negatives include no chills. She has tried antibiotics for the symptoms. The treatment provided mild relief.   PMHx, SurgHx, SocialHx, Medications, and Allergies were reviewed in the Visit Navigator and updated as appropriate.  Current Medications:   .  atenolol (TENORMIN) 50 MG tablet, Take 1 tablet (50 mg total) by mouth at bedtime., Disp: 90 tablet, Rfl: 3 .  atorvastatin (LIPITOR) 10 MG tablet, Take 1 tablet (10 mg total) by mouth daily., Disp: 90 tablet, Rfl: 3 .  Calcium Citrate-Vitamin D 315-250 MG-UNIT TABS, Take 1 tablet by mouth 2 (two) times daily., Disp: , Rfl:  .  diclofenac (VOLTAREN) 75 MG EC tablet, Take 75 mg by mouth 2 (two) times daily., Disp: , Rfl:  .  gabapentin (NEURONTIN) 100 MG capsule, Take 2 capsules (200 mg total) by mouth at bedtime., Disp: 60 capsule, Rfl: 3 .  hydrochlorothiazide (HYDRODIURIL) 25 MG tablet, Take 1 tablet (25 mg total) by mouth daily., Disp: 90 tablet, Rfl: 3 .  losartan (COZAAR) 50 MG tablet, Take 1 tablet (50 mg total) by mouth daily., Disp: 90 tablet, Rfl: 0 .  Multiple Vitamins-Minerals (FORMULA TWENTY-ONE PO), Take 1 capsule by  mouth., Disp: , Rfl:  .  Omega-3 Fatty Acids (FISH OIL) 1000 MG CAPS, Take 1,400 mg by mouth. , Disp: , Rfl:  .  pantoprazole (PROTONIX) 40 MG tablet, Take 1 tablet (40 mg total) by mouth daily., Disp: 90 tablet, Rfl: 3 .  valACYclovir (VALTREX) 500 MG tablet, Take 500 mg by mouth 2 (two) times daily., Disp: , Rfl:  .  Vitamin D, Ergocalciferol, (DRISDOL) 50000 units CAPS capsule, TAKE 1 CAPSULE (50,000 UNITS TOTAL) BY MOUTH EVERY 7 (SEVEN) DAYS., Disp: 12 capsule, Rfl: 3   Allergies  Allergen Reactions  . Hydrocodone-Acetaminophen Itching  . Morphine And Related Nausea Only    Nausea and vomitting  . Tetracyclines & Related     Break out on all extremties   Review of Systems:   Pertinent items are noted in the HPI. Otherwise, ROS is negative.  Vitals:   Vitals:   11/21/17 0906  BP: 132/84  Pulse: 78  Temp: 98.6 F (37 C)  TempSrc: Oral  SpO2: 96%  Weight: 163 lb 9.6 oz (74.2 kg)     Body mass index is 24.16 kg/m.  Physical Exam:   Physical Exam  Constitutional: She is oriented to person, place, and time. She appears well-developed and well-nourished. No distress.  HENT:  Head: Normocephalic and atraumatic.  Right Ear: External ear normal.  Left Ear: External ear normal.  Nose: Nose normal.  Mouth/Throat: Oropharynx is clear and moist.  Eyes: Conjunctivae and EOM  are normal. Pupils are equal, round, and reactive to light.  Neck: Normal range of motion. Neck supple. No thyromegaly present.  Cardiovascular: Normal rate, regular rhythm, normal heart sounds and intact distal pulses.  Pulmonary/Chest: Effort normal.  Central lung congestion bilaterally.  Productive cough.  Abdominal: Soft. Bowel sounds are normal.  Musculoskeletal: Normal range of motion.  Lymphadenopathy:    She has no cervical adenopathy.  Neurological: She is alert and oriented to person, place, and time.  Skin: Skin is warm and dry. Capillary refill takes less than 2 seconds.  Psychiatric: She has  a normal mood and affect. Her behavior is normal.  Nursing note and vitals reviewed.  Results for orders placed or performed in visit on 11/21/17  Comprehensive metabolic panel  Result Value Ref Range   Sodium 138 135 - 145 mEq/L   Potassium 4.1 3.5 - 5.1 mEq/L   Chloride 97 96 - 112 mEq/L   CO2 33 (H) 19 - 32 mEq/L   Glucose, Bld 99 70 - 99 mg/dL   BUN 14 6 - 23 mg/dL   Creatinine, Ser 0.58 0.40 - 1.20 mg/dL   Total Bilirubin 0.6 0.2 - 1.2 mg/dL   Alkaline Phosphatase 74 39 - 117 U/L   AST 53 (H) 0 - 37 U/L   ALT 57 (H) 0 - 35 U/L   Total Protein 7.4 6.0 - 8.3 g/dL   Albumin 4.4 3.5 - 5.2 g/dL   Calcium 9.6 8.4 - 10.5 mg/dL   GFR 113.32 >60.00 mL/min  Lipid panel  Result Value Ref Range   Cholesterol 253 (H) 0 - 200 mg/dL   Triglycerides 290.0 (H) 0.0 - 149.0 mg/dL   HDL 45.10 >39.00 mg/dL   VLDL 58.0 (H) 0.0 - 40.0 mg/dL   Total CHOL/HDL Ratio 6    NonHDL 207.81   Urinalysis, Routine w reflex microscopic  Result Value Ref Range   Color, Urine YELLOW Yellow;Lt. Yellow   APPearance Sl Cloudy (A) Clear   Specific Gravity, Urine 1.020 1.000 - 1.030   pH 6.0 5.0 - 8.0   Total Protein, Urine NEGATIVE Negative   Urine Glucose NEGATIVE Negative   Ketones, ur NEGATIVE Negative   Bilirubin Urine NEGATIVE Negative   Hgb urine dipstick NEGATIVE Negative   Urobilinogen, UA 0.2 0.0 - 1.0   Leukocytes, UA SMALL (A) Negative   Nitrite NEGATIVE Negative   WBC, UA 7-10/hpf (A) 0-2/hpf   RBC / HPF none seen 0-2/hpf   Squamous Epithelial / LPF Rare(0-4/hpf) Rare(0-4/hpf)   Bacteria, UA Many(>50/hpf) (A) None  CBC with Differential/Platelet  Result Value Ref Range   WBC 6.6 4.0 - 10.5 K/uL   RBC 3.92 3.87 - 5.11 Mil/uL   Hemoglobin 13.1 12.0 - 15.0 g/dL   HCT 38.8 36.0 - 46.0 %   MCV 99.1 78.0 - 100.0 fl   MCHC 33.9 30.0 - 36.0 g/dL   RDW 11.9 11.5 - 15.5 %   Platelets 249.0 150.0 - 400.0 K/uL   Neutrophils Relative % 61.5 43.0 - 77.0 %   Lymphocytes Relative 27.9 12.0 - 46.0 %    Monocytes Relative 8.7 3.0 - 12.0 %   Eosinophils Relative 1.3 0.0 - 5.0 %   Basophils Relative 0.6 0.0 - 3.0 %   Neutro Abs 4.0 1.4 - 7.7 K/uL   Lymphs Abs 1.8 0.7 - 4.0 K/uL   Monocytes Absolute 0.6 0.1 - 1.0 K/uL   Eosinophils Absolute 0.1 0.0 - 0.7 K/uL   Basophils Absolute 0.0 0.0 -  0.1 K/uL  LDL cholesterol, direct  Result Value Ref Range   Direct LDL 146.0 mg/dL   Assessment and Plan:   1. Mixed hyperlipidemia  The patient has been holding her statin if that improves her liver function tests. Trying to exercise on a regular basis? []   YES  [x]   NO Diet Compliance: compliant most of the time. Cardiovascular ROS: no chest pain or dyspnea on exertion.   Lipids:    Component Value Date/Time   CHOL 253 (H) 11/21/2017 0913   TRIG 290.0 (H) 11/21/2017 0913   HDL 45.10 11/21/2017 0913   LDLDIRECT 146.0 11/21/2017 0913   VLDL 58.0 (H) 11/21/2017 0913   CHOLHDL 6 11/21/2017 0913   - Lipid panel - LDL cholesterol, direct  2. Elevated LFTs Liver function tests are improving.  The change has been holding her statin.  She did have an ultrasound on 09/28/2017.  This showed increased echogenicity consistent with fatty infiltration.  Lab Results  Component Value Date   ALT 57 (H) 11/21/2017   AST 53 (H) 11/21/2017   ALKPHOS 74 11/21/2017   BILITOT 0.6 11/21/2017   - Comprehensive metabolic panel  3. Fatty liver As above.  4. Dysuria See HPI for interim information.  UA consistent with UTI.  Urine culture is pending.  - Urinalysis, Routine w reflex microscopic - levofloxacin (LEVAQUIN) 500 MG tablet; Take 1 tablet (500 mg total) by mouth daily.  Dispense: 7 tablet; Refill: 0 - CBC with Differential/Platelet - cefTRIAXone (ROCEPHIN) injection 500 mg  5. Cough See HPI above.  Exam consistent with bronchopneumonia.  Treatment with below to cover urine as well.  Symbicort sample was provided today.  We discussed red flags for follow-up.  - levofloxacin (LEVAQUIN) 500  MG tablet; Take 1 tablet (500 mg total) by mouth daily.  Dispense: 7 tablet; Refill: 0 - CBC with Differential/Platelet - budesonide-formoterol (SYMBICORT) 80-4.5 MCG/ACT inhaler; Inhale 2 puffs into the lungs 2 (two) times daily.  Dispense: 1 Inhaler; Refill: 3   . Reviewed expectations re: course of current medical issues. . Discussed self-management of symptoms. . Outlined signs and symptoms indicating need for more acute intervention. . Patient verbalized understanding and all questions were answered. Marland Kitchen Health Maintenance issues including appropriate healthy diet, exercise, and smoking avoidance were discussed with patient. . See orders for this visit as documented in the electronic medical record. . Patient received an After Visit Summary.  CMA served as Education administrator during this visit. History, Physical, and Plan performed by medical provider. The above documentation has been reviewed and is accurate and complete. Briscoe Deutscher, D.O.  Briscoe Deutscher, DO [place] 11/22/2017  Records requested if needed. Time spent with the patient: 30 minutes, of which >50% was spent in obtaining information about her symptoms, reviewing her previous labs, evaluations, and treatments, counseling her about her condition (please see the discussed topics above), and developing a plan to further investigate it; she had a number of questions which I addressed.

## 2017-11-24 ENCOUNTER — Telehealth: Payer: Self-pay | Admitting: Family Medicine

## 2017-11-24 NOTE — Telephone Encounter (Signed)
Ok to put in referral?

## 2017-11-24 NOTE — Telephone Encounter (Signed)
See note

## 2017-11-24 NOTE — Telephone Encounter (Signed)
Pt. Given lab results and instructions. Request to see Dr. Gerrit Halls - cardiology - for lipid clinic, if possible. States she has seen him before.

## 2017-11-25 NOTE — Telephone Encounter (Signed)
Okay to put in referral.

## 2017-11-27 ENCOUNTER — Other Ambulatory Visit: Payer: Self-pay

## 2017-11-27 DIAGNOSIS — K760 Fatty (change of) liver, not elsewhere classified: Secondary | ICD-10-CM

## 2017-11-27 NOTE — Telephone Encounter (Signed)
Referral entered  

## 2017-12-07 ENCOUNTER — Ambulatory Visit: Payer: 59 | Admitting: Family Medicine

## 2017-12-15 ENCOUNTER — Ambulatory Visit: Payer: 59 | Admitting: Family Medicine

## 2017-12-18 ENCOUNTER — Ambulatory Visit (INDEPENDENT_AMBULATORY_CARE_PROVIDER_SITE_OTHER): Payer: 59 | Admitting: Pharmacist

## 2017-12-18 ENCOUNTER — Ambulatory Visit: Payer: 59 | Admitting: Family Medicine

## 2017-12-18 ENCOUNTER — Encounter: Payer: Self-pay | Admitting: Family Medicine

## 2017-12-18 VITALS — BP 126/82 | HR 73 | Temp 98.6°F | Wt 163.0 lb

## 2017-12-18 DIAGNOSIS — E785 Hyperlipidemia, unspecified: Secondary | ICD-10-CM | POA: Diagnosis not present

## 2017-12-18 DIAGNOSIS — R739 Hyperglycemia, unspecified: Secondary | ICD-10-CM

## 2017-12-18 DIAGNOSIS — L989 Disorder of the skin and subcutaneous tissue, unspecified: Secondary | ICD-10-CM | POA: Diagnosis not present

## 2017-12-18 LAB — POCT GLYCOSYLATED HEMOGLOBIN (HGB A1C): Hemoglobin A1C: 5.1

## 2017-12-18 MED ORDER — CLINDAMYCIN HCL 300 MG PO CAPS: 300.0000 mg | ORAL_CAPSULE | Freq: Three times a day (TID) | ORAL | 1 refills | Status: DC

## 2017-12-18 MED ORDER — PITAVASTATIN CALCIUM 1 MG PO TABS: 1.0000 mg | ORAL_TABLET | Freq: Every day | ORAL | 5 refills | Status: DC

## 2017-12-18 MED FILL — CLINDAMYCIN HCL 300 MG CAPS: 300 | 7 days supply | Qty: 21 | Fill #0

## 2017-12-18 MED FILL — LIVALO 1 MG TABLET: 1 | 30 days supply | Qty: 30 | Fill #0

## 2017-12-18 NOTE — Progress Notes (Signed)
Patient ID: [name] [name]                 DOB: Oct 23, 1961                    MRN: [id number]     HPI: [name] [name] is a 57 y.o. female patient that will be establishing with Dr. Debara Pickett that presents today for lipid evaluation.  PMH includes hypercholesterolemia, hypertension, a cerebral aneurysm requiring coiling in the 1990s, and hysterectomy in 2005. She was previously evaluated by Dr. Burt Knack for chest pressure and pain concerning for angina. Work up for this was negative and she reports that she believes this pain was post-operative pain.   She presents today for discussion of lipid management. She reports no other cardiovascular history. She does endorse a family history of high cholesterol, HTN and believed early ASCVD in her maternal grandmother. She states she was started on atorvastatin 7m daily about 1.5 years ago by a new PCP. She reports that over the last few years her LDL had been steadily creeping up despite a fairly health lifestyle. She states her previous PCP was inclined to watch and wait, but when she followed up with a different provider in the same office she was started on atorvastatin. She states that she did not have follow up labs drawn for over a year. When she established with her current PCP, Dr. WJuleen China her labs were drawn and revealed AST elevated to 290 and ALT elevated to 427. Her statin was placed on hold and her LFT trended down, now back to 53 and 57 respectively. She does have a history of fatty liver as well. Her lifetime risk of ASCVD is 50% and her 10 year estimated risk is 5.0% with optimized risk factors can reduce to 1.6%.   On her most recent panel her atorvastatin had been on hold for about 6 weeks.   Risk Factors: HTN, family history of high cholesterol LDL Goal: <100 for aggressive management of primary hyperlipidemia  Current Medications: fish oil 14071mdaily  Intolerances: atorvastatin 10104m elevated LFTs  Diet: B: usually does not eat breakfast, if  does drinks isogenic shakes S: graham crackers, chicken from chickfila biscuit (she does not eat the bread) L: brings from home - left overs from night before D: salmon with terriaki and noodles  Avoid fast foods when possible, she tries to avoid fried foods when she does eat out. She does well with getting her vegetables. She does not drink soda. She drinks water mostly.   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 She is active taking care of her horse. She is still having some back pain.   Family History: Father with high cholesterol and her 2 full siblings, maternal grandfather passed at 76 78 MI, maternal grandmother had several heart attacks (she believes at an early age). Father and siblings with high blood pressure. Mother with HTN.   Social History: Former smoker - Quit 33 years ago, no smokeless tobacco. Regular wine use 2- 3 glasses per sitting 4-5 days per week.   Labs: 11/21/17:  TC 253, TG 290, HDL 45, nonHDL 207, LDL-d 146 (fish oil 1400m75mPast Medical History:  Diagnosis Date  . Cerebral aneurysm 1999   Surgically coiled  . DDD (degenerative disc disease)   . GERD (gastroesophageal reflux disease)   . History of Barrett's esophagus   . Hypercholesterolemia   . Hypertension   . Irritable bowel  disease   . Sinus disease     Current Outpatient Medications on File Prior to Visit  Medication Sig Dispense Refill  . atenolol (TENORMIN) 50 MG tablet Take 1 tablet (50 mg total) by mouth at bedtime. 90 tablet 3  . atorvastatin (LIPITOR) 10 MG tablet Take 1 tablet (10 mg total) by mouth daily. 90 tablet 3  . budesonide-formoterol (SYMBICORT) 80-4.5 MCG/ACT inhaler Inhale 2 puffs into the lungs 2 (two) times daily. 1 Inhaler 3  . Calcium Citrate-Vitamin D 315-250 MG-UNIT TABS Take 1 tablet by mouth 2 (two) times daily.    . diclofenac (VOLTAREN) 75 MG EC tablet Take 75 mg by mouth 2 (two) times daily.    Marland Kitchen gabapentin (NEURONTIN) 100 MG  capsule Take 2 capsules (200 mg total) by mouth at bedtime. 60 capsule 3  . hydrochlorothiazide (HYDRODIURIL) 25 MG tablet Take 1 tablet (25 mg total) by mouth daily. 90 tablet 3  . levofloxacin (LEVAQUIN) 500 MG tablet Take 1 tablet (500 mg total) by mouth daily. 7 tablet 0  . losartan (COZAAR) 50 MG tablet Take 1 tablet (50 mg total) by mouth daily. 90 tablet 0  . Multiple Vitamins-Minerals (FORMULA TWENTY-ONE PO) Take 1 capsule by mouth.    . Omega-3 Fatty Acids (FISH OIL) 1000 MG CAPS Take 1,400 mg by mouth.     . pantoprazole (PROTONIX) 40 MG tablet Take 1 tablet (40 mg total) by mouth daily. 90 tablet 3  . valACYclovir (VALTREX) 500 MG tablet Take 500 mg by mouth 2 (two) times daily.    . Vitamin D, Ergocalciferol, (DRISDOL) 50000 units CAPS capsule TAKE 1 CAPSULE (50,000 UNITS TOTAL) BY MOUTH EVERY 7 (SEVEN) DAYS. 12 capsule 3   No current facility-administered medications on file prior to visit.     Allergies  Allergen Reactions  . Hydrocodone-Acetaminophen Itching  . Morphine And Related Nausea Only    Nausea and vomitting  . Tetracyclines & Related     Break out on all extremties    Assessment/Plan: Hyperlipidemia: LDL not at aggressive goal for primary prevention of <100. We discussed options including retrial of statin, Zetia, and clinical trial. She is willing to rechallenge with a low dose of statin. Will challenge with Livalo 8m daily since this statin tends to be less associated with changes in liver function. Will recheck LFTs in 2 weeks given history of significant elevations in LFTs on atorvastatin. Discussed need to follow up with Dr. HDebara Pickettand she is in agreement with this plan. She currently has an appt scheduled for March 4th with Dr. HDebara Pickett She will be unable to keep this appt due to her work schedule. Will route to Dr. HDebara Pickettand his nurse to schedule appt when appropriate for Lipid clinic. Will plan to recheck lipid panel in 8-12 weeks if LFTs remain stable on Livalo  161mdaily. Follow up with Dr. HiDebara Picketts recommended.    Thank you,  KeLelan PonsAuPatterson HammersmithPhG[facility]bur[facility]  12/18/2017 7:04 AM

## 2017-12-18 NOTE — Patient Instructions (Addendum)
We will start Livalo 8m daily and recheck liver function tests in 2 weeks.   Cholesterol Cholesterol is a fat. Your body needs a small amount of cholesterol. Cholesterol (plaque) may build up in your blood vessels (arteries). That makes you more likely to have a heart attack or stroke. You cannot feel your cholesterol level. Having a blood test is the only way to find out if your level is high. Keep your test results. Work with your doctor to keep your cholesterol at a good level. What do the results mean?  Total cholesterol is how much cholesterol is in your blood.  LDL is bad cholesterol. This is the type that can build up. Try to have low LDL.  HDL is good cholesterol. It cleans your blood vessels and carries LDL away. Try to have high HDL.  Triglycerides are fat that the body can store or burn for energy. What are good levels of cholesterol?  Total cholesterol below 200.  LDL below 100 is good for people who have health risks. LDL below 70 is good for people who have very high risks.  HDL above 40 is good. It is best to have HDL of 60 or higher.  Triglycerides below 150. How can I lower my cholesterol? Diet Follow your diet program as told by your doctor.  Choose fish, white meat chicken, or t[place]that is roasted or baked. Try not to eat red meat, fried foods, sausage, or lunch meats.  Eat lots of fresh fruits and vegetables.  Choose whole grains, beans, pasta, potatoes, and cereals.  Choose olive oil, corn oil, or canola oil. Only use small amounts.  Try not to eat butter, mayonnaise, shortening, or palm kernel oils.  Try not to eat foods with trans fats.  Choose low-fat or nonfat dairy foods. ? Drink skim or nonfat milk. ? Eat low-fat or nonfat yogurt and cheeses. ? Try not to drink whole milk or cream. ? Try not to eat ice cream, egg yolks, or full-fat cheeses.  Healthy desserts include angel food cake, ginger snaps, animal crackers, hard candy, popsicles, and  low-fat or nonfat frozen yogurt. Try not to eat pastries, cakes, pies, and cookies.  Exercise Follow your exercise program as told by your doctor.  Be more active. Try gardening, walking, and taking the stairs.  Ask your doctor about ways that you can be more active.  Medicine  Take over-the-counter and prescription medicines only as told by your doctor. This information is not intended to replace advice given to you by your health care provider. Make sure you discuss any questions you have with your health care provider. Document Released: 01/27/2009 Document Revised: 06/01/2016 Document Reviewed: 05/12/2016 Elsevier Interactive Patient Education  2[employer]

## 2017-12-18 NOTE — Progress Notes (Signed)
[name] [name] is a 57 y.o. female is here for follow up.  History of Present Illness:   Lonell Grandchild, CMA acting as scribe for Dr. Briscoe Deutscher.   HPI: Patient states she has cyst on left side under shoulder. She noticed it off and on for about three years. About a week ago she noticed that it is tender to the touch and is red. She is noticed that their is a lot for drainage.   Health Maintenance Due  Topic Date Due  . Hepatitis C Screening  17-Nov-1960  . HIV Screening  06/17/1974  . PAP SMEAR  06/17/1980  . COLONOSCOPY  06/17/2009   Depression screen PHQ 2/9 09/01/2017  Decreased Interest 0  Down, Depressed, Hopeless 0  PHQ - 2 Score 0   PMHx, SurgHx, SocialHx, FamHx, Medications, and Allergies were reviewed in the Visit Navigator and updated as appropriate.   Patient Active Problem List   Diagnosis Date Noted  . Fatty liver 11/21/2017  . Elevated LFTs, fatty liver on [place] (09/28/17) 11/21/2017  . Barrett esophagus 09/01/2017  . Cerebral aneurysm 09/01/2017  . GERD (gastroesophageal reflux disease) 09/01/2017  . Hyperlipidemia 09/01/2017  . IBS (irritable bowel syndrome) 09/01/2017  . PUD (peptic ulcer disease) 09/01/2017  . Routine physical examination, 09/01/17 09/01/2017  . 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 Sacral fracture ([place]) 05/22/2017  . Chest pressure 01/10/2012  . Benign hypertension 01/10/2012   Social History   Tobacco Use  . Smoking status: Former Smoker    Last attempt to quit: 11/14/1984    Years since quitting: 33.1  . Smokeless tobacco: Never Used  Substance Use Topics  . Alcohol use: Yes    Comment: social  . Drug use: No   Current Medications and Allergies:   Current Outpatient Medications:  .  atenolol (TENORMIN) 50 MG tablet, Take 1 tablet (50 mg total) by mouth at bedtime., Disp: 90 tablet, Rfl: 3 .  atorvastatin (LIPITOR) 10 MG tablet, Take 1 tablet (10 mg total) by mouth daily., Disp: 90 tablet, Rfl: 3 .  budesonide-formoterol  (SYMBICORT) 80-4.5 MCG/ACT inhaler, Inhale 2 puffs into the lungs 2 (two) times daily., Disp: 1 Inhaler, Rfl: 3 .  Calcium Citrate-Vitamin D 315-250 MG-UNIT TABS, Take 1 tablet by mouth 2 (two) times daily., Disp: , Rfl:  .  diclofenac (VOLTAREN) 75 MG EC tablet, Take 75 mg by mouth 2 (two) times daily., Disp: , Rfl:  .  gabapentin (NEURONTIN) 100 MG capsule, Take 2 capsules (200 mg total) by mouth at bedtime., Disp: 60 capsule, Rfl: 3 .  hydrochlorothiazide (HYDRODIURIL) 25 MG tablet, Take 1 tablet (25 mg total) by mouth daily., Disp: 90 tablet, Rfl: 3 .  levofloxacin (LEVAQUIN) 500 MG tablet, Take 1 tablet (500 mg total) by mouth daily., Disp: 7 tablet, Rfl: 0 .  losartan (COZAAR) 50 MG tablet, Take 1 tablet (50 mg total) by mouth daily., Disp: 90 tablet, Rfl: 0 .  Multiple Vitamins-Minerals (FORMULA TWENTY-ONE PO), Take 1 capsule by mouth., Disp: , Rfl:  .  Omega-3 Fatty Acids (FISH OIL) 1000 MG CAPS, Take 1,400 mg by mouth. , Disp: , Rfl:  .  pantoprazole (PROTONIX) 40 MG tablet, Take 1 tablet (40 mg total) by mouth daily., Disp: 90 tablet, Rfl: 3 .  valACYclovir (VALTREX) 500 MG tablet, Take 500 mg by mouth 2 (two) times daily., Disp: , Rfl:  .  Vitamin D, Ergocalciferol, (DRISDOL) 50000 units CAPS capsule, TAKE 1 CAPSULE (50,000 UNITS TOTAL) BY MOUTH EVERY 7 (  SEVEN) DAYS., Disp: 12 capsule, Rfl: 3   Allergies  Allergen Reactions  . Hydrocodone-Acetaminophen Itching  . Morphine And Related Nausea Only    Nausea and vomitting  . Tetracyclines & Related     Break out on all extremties   Review of Systems   Pertinent items are noted in the HPI. Otherwise, ROS is negative.  Vitals:   Vitals:   12/18/17 1042  BP: 126/82  Pulse: 73  Temp: 98.6 F (37 C)  TempSrc: Oral  SpO2: 97%  Weight: 163 lb (73.9 kg)     Body mass index is 24.07 kg/m.  Physical Exam:   Physical Exam  Constitutional: She appears well-nourished.  HENT:  Head: Normocephalic and atraumatic.  Eyes: EOM  are normal. Pupils are equal, round, and reactive to light.  Neck: Normal range of motion. Neck supple.  Cardiovascular: Normal rate, regular rhythm, normal heart sounds and intact distal pulses.  Pulmonary/Chest: Effort normal.  Abdominal: Soft.  Skin: Skin is warm.  Infected epidermoid cyst under left scapula.  Psychiatric: She has a normal mood and affect. Her behavior is normal.  Nursing note and vitals reviewed.    Assessment and Plan:   1. Skin lesion Epidermoid cyst infected. Reviewed diagnosis and treatment.  Antibiotic prescribed.  Wound care reviewed.  I also sent a referral for dermatology for removal after it is noninfected.  - Ambulatory referral to Dermatology  2. Hyperglycemia  Lab Results  Component Value Date   HGBA1C 5.1 12/18/2017   - POCT glycosylated hemoglobin (Hb A1C)   . Reviewed expectations re: course of current medical issues. . Discussed self-management of symptoms. . Outlined signs and symptoms indicating need for more acute intervention. . Patient verbalized understanding and all questions were answered. Marland Kitchen Health Maintenance issues including appropriate healthy diet, exercise, and smoking avoidance were discussed with patient. . See orders for this visit as documented in the electronic medical record. . Patient received an After Visit Summary.  CMA served as Education administrator during this visit. History, Physical, and Plan performed by medical provider. The above documentation has been reviewed and is accurate and complete. Briscoe Deutscher, D.O.   Briscoe Deutscher, DO [place] 12/22/2017

## 2017-12-26 DIAGNOSIS — M9901 Segmental and somatic dysfunction of cervical region: Secondary | ICD-10-CM | POA: Diagnosis not present

## 2017-12-26 DIAGNOSIS — M9902 Segmental and somatic dysfunction of thoracic region: Secondary | ICD-10-CM | POA: Diagnosis not present

## 2017-12-26 DIAGNOSIS — M530 Cervicocranial syndrome: Secondary | ICD-10-CM | POA: Diagnosis not present

## 2017-12-26 DIAGNOSIS — M9903 Segmental and somatic dysfunction of lumbar region: Secondary | ICD-10-CM | POA: Diagnosis not present

## 2017-12-27 DIAGNOSIS — M9903 Segmental and somatic dysfunction of lumbar region: Secondary | ICD-10-CM | POA: Diagnosis not present

## 2017-12-27 DIAGNOSIS — M530 Cervicocranial syndrome: Secondary | ICD-10-CM | POA: Diagnosis not present

## 2017-12-27 DIAGNOSIS — M9901 Segmental and somatic dysfunction of cervical region: Secondary | ICD-10-CM | POA: Diagnosis not present

## 2017-12-27 DIAGNOSIS — M9902 Segmental and somatic dysfunction of thoracic region: Secondary | ICD-10-CM | POA: Diagnosis not present

## 2018-01-01 DIAGNOSIS — M9903 Segmental and somatic dysfunction of lumbar region: Secondary | ICD-10-CM | POA: Diagnosis not present

## 2018-01-01 DIAGNOSIS — M9902 Segmental and somatic dysfunction of thoracic region: Secondary | ICD-10-CM | POA: Diagnosis not present

## 2018-01-01 DIAGNOSIS — M530 Cervicocranial syndrome: Secondary | ICD-10-CM | POA: Diagnosis not present

## 2018-01-01 DIAGNOSIS — M9901 Segmental and somatic dysfunction of cervical region: Secondary | ICD-10-CM | POA: Diagnosis not present

## 2018-01-02 ENCOUNTER — Other Ambulatory Visit: Payer: 59

## 2018-01-02 DIAGNOSIS — M9901 Segmental and somatic dysfunction of cervical region: Secondary | ICD-10-CM | POA: Diagnosis not present

## 2018-01-02 DIAGNOSIS — M9903 Segmental and somatic dysfunction of lumbar region: Secondary | ICD-10-CM | POA: Diagnosis not present

## 2018-01-02 DIAGNOSIS — M530 Cervicocranial syndrome: Secondary | ICD-10-CM | POA: Diagnosis not present

## 2018-01-02 DIAGNOSIS — M9902 Segmental and somatic dysfunction of thoracic region: Secondary | ICD-10-CM | POA: Diagnosis not present

## 2018-01-03 DIAGNOSIS — M530 Cervicocranial syndrome: Secondary | ICD-10-CM | POA: Diagnosis not present

## 2018-01-03 DIAGNOSIS — M9903 Segmental and somatic dysfunction of lumbar region: Secondary | ICD-10-CM | POA: Diagnosis not present

## 2018-01-03 DIAGNOSIS — M9901 Segmental and somatic dysfunction of cervical region: Secondary | ICD-10-CM | POA: Diagnosis not present

## 2018-01-03 DIAGNOSIS — M9902 Segmental and somatic dysfunction of thoracic region: Secondary | ICD-10-CM | POA: Diagnosis not present

## 2018-01-04 ENCOUNTER — Ambulatory Visit: Payer: 59 | Admitting: Family Medicine

## 2018-01-04 ENCOUNTER — Telehealth: Payer: Self-pay | Admitting: Internal Medicine

## 2018-01-04 ENCOUNTER — Other Ambulatory Visit: Payer: 59 | Admitting: *Deleted

## 2018-01-04 DIAGNOSIS — E785 Hyperlipidemia, unspecified: Secondary | ICD-10-CM

## 2018-01-04 LAB — HEPATIC FUNCTION PANEL
ALK PHOS: 89 IU/L (ref 39–117)
ALT: 51 IU/L — AB (ref 0–32)
AST: 54 IU/L — ABNORMAL HIGH (ref 0–40)
Albumin: 4.8 g/dL (ref 3.5–5.5)
BILIRUBIN, DIRECT: 0.13 mg/dL (ref 0.00–0.40)
Bilirubin Total: 0.5 mg/dL (ref 0.0–1.2)
Total Protein: 7.6 g/dL (ref 6.0–8.5)

## 2018-01-04 NOTE — Telephone Encounter (Signed)
LM for patient to call back to r/s lipid clinic appt from March 4 to another date (April 29, May 9) per message received from K. Patterson Hammersmith, RPH.

## 2018-01-04 NOTE — Telephone Encounter (Signed)
Patient returned call. Scheduled her for first available lipid clinic appointment that she was able to make - May 9

## 2018-01-08 DIAGNOSIS — M9903 Segmental and somatic dysfunction of lumbar region: Secondary | ICD-10-CM | POA: Diagnosis not present

## 2018-01-08 DIAGNOSIS — M9902 Segmental and somatic dysfunction of thoracic region: Secondary | ICD-10-CM | POA: Diagnosis not present

## 2018-01-08 DIAGNOSIS — M530 Cervicocranial syndrome: Secondary | ICD-10-CM | POA: Diagnosis not present

## 2018-01-08 DIAGNOSIS — M9901 Segmental and somatic dysfunction of cervical region: Secondary | ICD-10-CM | POA: Diagnosis not present

## 2018-01-09 DIAGNOSIS — M9902 Segmental and somatic dysfunction of thoracic region: Secondary | ICD-10-CM | POA: Diagnosis not present

## 2018-01-09 DIAGNOSIS — M9901 Segmental and somatic dysfunction of cervical region: Secondary | ICD-10-CM | POA: Diagnosis not present

## 2018-01-09 DIAGNOSIS — M9903 Segmental and somatic dysfunction of lumbar region: Secondary | ICD-10-CM | POA: Diagnosis not present

## 2018-01-09 DIAGNOSIS — M530 Cervicocranial syndrome: Secondary | ICD-10-CM | POA: Diagnosis not present

## 2018-01-10 DIAGNOSIS — M9901 Segmental and somatic dysfunction of cervical region: Secondary | ICD-10-CM | POA: Diagnosis not present

## 2018-01-10 DIAGNOSIS — M530 Cervicocranial syndrome: Secondary | ICD-10-CM | POA: Diagnosis not present

## 2018-01-10 DIAGNOSIS — M9903 Segmental and somatic dysfunction of lumbar region: Secondary | ICD-10-CM | POA: Diagnosis not present

## 2018-01-10 DIAGNOSIS — M9902 Segmental and somatic dysfunction of thoracic region: Secondary | ICD-10-CM | POA: Diagnosis not present

## 2018-01-15 ENCOUNTER — Ambulatory Visit: Payer: 59 | Admitting: Internal Medicine

## 2018-01-15 DIAGNOSIS — M9902 Segmental and somatic dysfunction of thoracic region: Secondary | ICD-10-CM | POA: Diagnosis not present

## 2018-01-15 DIAGNOSIS — M9901 Segmental and somatic dysfunction of cervical region: Secondary | ICD-10-CM | POA: Diagnosis not present

## 2018-01-15 DIAGNOSIS — M9903 Segmental and somatic dysfunction of lumbar region: Secondary | ICD-10-CM | POA: Diagnosis not present

## 2018-01-15 DIAGNOSIS — M530 Cervicocranial syndrome: Secondary | ICD-10-CM | POA: Diagnosis not present

## 2018-01-16 DIAGNOSIS — M530 Cervicocranial syndrome: Secondary | ICD-10-CM | POA: Diagnosis not present

## 2018-01-16 DIAGNOSIS — M9901 Segmental and somatic dysfunction of cervical region: Secondary | ICD-10-CM | POA: Diagnosis not present

## 2018-01-16 DIAGNOSIS — M9902 Segmental and somatic dysfunction of thoracic region: Secondary | ICD-10-CM | POA: Diagnosis not present

## 2018-01-16 DIAGNOSIS — M9903 Segmental and somatic dysfunction of lumbar region: Secondary | ICD-10-CM | POA: Diagnosis not present

## 2018-01-17 DIAGNOSIS — M530 Cervicocranial syndrome: Secondary | ICD-10-CM | POA: Diagnosis not present

## 2018-01-17 DIAGNOSIS — M9903 Segmental and somatic dysfunction of lumbar region: Secondary | ICD-10-CM | POA: Diagnosis not present

## 2018-01-17 DIAGNOSIS — M9902 Segmental and somatic dysfunction of thoracic region: Secondary | ICD-10-CM | POA: Diagnosis not present

## 2018-01-17 DIAGNOSIS — M9901 Segmental and somatic dysfunction of cervical region: Secondary | ICD-10-CM | POA: Diagnosis not present

## 2018-01-22 ENCOUNTER — Other Ambulatory Visit: Payer: Self-pay | Admitting: Family Medicine

## 2018-01-22 MED FILL — LOSARTAN POTASSIUM 50 MG TA: 50 | 90 days supply | Qty: 90 | Fill #0

## 2018-01-22 MED FILL — PANTOPRAZOLE SOD DR 40 MG T: 40 | 90 days supply | Qty: 90 | Fill #1

## 2018-01-22 MED FILL — ATENOLOL 50 MG TABLET: 50 | 90 days supply | Qty: 90 | Fill #1

## 2018-01-22 MED FILL — VIT D2 1.25 MG (50,000 UNIT: 1.25 MG | 84 days supply | Qty: 12 | Fill #1

## 2018-01-22 MED FILL — LIVALO 1 MG TABLET: 1 | 30 days supply | Qty: 30 | Fill #1

## 2018-01-22 MED FILL — HYDROCHLOROTHIAZIDE 25 MG T: 25 | 90 days supply | Qty: 90 | Fill #1

## 2018-01-23 DIAGNOSIS — K642 Third degree hemorrhoids: Secondary | ICD-10-CM | POA: Diagnosis not present

## 2018-01-23 DIAGNOSIS — K5730 Diverticulosis of large intestine without perforation or abscess without bleeding: Secondary | ICD-10-CM | POA: Diagnosis not present

## 2018-01-23 DIAGNOSIS — K621 Rectal polyp: Secondary | ICD-10-CM | POA: Diagnosis not present

## 2018-01-23 DIAGNOSIS — Z1211 Encounter for screening for malignant neoplasm of colon: Secondary | ICD-10-CM | POA: Diagnosis not present

## 2018-01-23 LAB — HM COLONOSCOPY

## 2018-01-24 DIAGNOSIS — M9902 Segmental and somatic dysfunction of thoracic region: Secondary | ICD-10-CM | POA: Diagnosis not present

## 2018-01-24 DIAGNOSIS — M9901 Segmental and somatic dysfunction of cervical region: Secondary | ICD-10-CM | POA: Diagnosis not present

## 2018-01-24 DIAGNOSIS — M530 Cervicocranial syndrome: Secondary | ICD-10-CM | POA: Diagnosis not present

## 2018-01-24 DIAGNOSIS — M9903 Segmental and somatic dysfunction of lumbar region: Secondary | ICD-10-CM | POA: Diagnosis not present

## 2018-01-29 DIAGNOSIS — M9902 Segmental and somatic dysfunction of thoracic region: Secondary | ICD-10-CM | POA: Diagnosis not present

## 2018-01-29 DIAGNOSIS — M530 Cervicocranial syndrome: Secondary | ICD-10-CM | POA: Diagnosis not present

## 2018-01-29 DIAGNOSIS — M9903 Segmental and somatic dysfunction of lumbar region: Secondary | ICD-10-CM | POA: Diagnosis not present

## 2018-01-29 DIAGNOSIS — M9901 Segmental and somatic dysfunction of cervical region: Secondary | ICD-10-CM | POA: Diagnosis not present

## 2018-01-31 ENCOUNTER — Telehealth: Payer: Self-pay | Admitting: Family Medicine

## 2018-01-31 NOTE — Telephone Encounter (Signed)
Left message for patient to call back and schedule nurse visit for 2nd Shingrix vaccine.

## 2018-01-31 NOTE — Telephone Encounter (Signed)
See note and contact patient.  Copied from [place]. Topic: General - Other >> Jan 30, 2018  5:11 PM Marin Olp L wrote: Reason for CRM: Patient was told on 12/18/2017 that she would get a call notify her when shingles 2 was available in the office again? Please call back to advise.

## 2018-02-02 NOTE — Telephone Encounter (Signed)
Patient has been scheduled for nurse visit.

## 2018-02-07 ENCOUNTER — Encounter: Payer: Self-pay | Admitting: *Deleted

## 2018-02-07 ENCOUNTER — Ambulatory Visit (INDEPENDENT_AMBULATORY_CARE_PROVIDER_SITE_OTHER): Payer: 59 | Admitting: *Deleted

## 2018-02-07 DIAGNOSIS — Z23 Encounter for immunization: Secondary | ICD-10-CM

## 2018-02-07 NOTE — Progress Notes (Signed)
Pt presented to office for 2nd Shingrix injection. Pt tolerated well.

## 2018-02-19 MED FILL — LIVALO 1 MG TABLET: 1 | 30 days supply | Qty: 30 | Fill #2

## 2018-03-21 MED FILL — LIVALO 1 MG TABLET: 1 | 30 days supply | Qty: 30 | Fill #3

## 2018-03-22 ENCOUNTER — Ambulatory Visit: Payer: 59 | Admitting: Internal Medicine

## 2018-03-22 ENCOUNTER — Encounter: Payer: Self-pay | Admitting: Internal Medicine

## 2018-03-22 VITALS — BP 125/77 | HR 72 | Ht 69.0 in | Wt 166.4 lb

## 2018-03-22 DIAGNOSIS — K760 Fatty (change of) liver, not elsewhere classified: Secondary | ICD-10-CM

## 2018-03-22 DIAGNOSIS — E785 Hyperlipidemia, unspecified: Secondary | ICD-10-CM

## 2018-03-22 DIAGNOSIS — I10 Essential (primary) hypertension: Secondary | ICD-10-CM | POA: Diagnosis not present

## 2018-03-22 DIAGNOSIS — Z8249 Family history of ischemic heart disease and other diseases of the circulatory system: Secondary | ICD-10-CM

## 2018-03-22 NOTE — Progress Notes (Signed)
OFFICE NOTE  Chief Complaint:  Follow-up dyslipidemia  Primary Care Physician: Briscoe Deutscher, DO  HPI:  [name] [name] is a 58 y.o. female with a past medial history significant for cerebral aneurysm, hypertension, dyslipidemia and family history of hypertension and dyslipidemia, but no significant early onset heart disease.  She was noted to have dyslipidemia in the past and this was monitored.  When she changed primary care providers she was started on statin therapy which caused a marked reduction in cholesterol.  The time this was atorvastatin 10 mg, however she had significant elevation of liver enzymes.  An abdominal ultrasound in 2018 demonstrated fatty liver disease is the likely cause of this.  After stopping the statin therapy her liver enzymes returned to baseline which was borderline abnormal.  She was seen by Tana Coast, PharmD, for evaluation of her dyslipidemia as a new patient. Georgina Peer recommended trying Livalo 1 mg daily.  [name] [name] reports compliance with this medication and had liver enzyme testing 2 months ago which indicated stable liver enzymes.  I suspect she is tolerating this well as the statin is predominantly glucuronidated.  However, it is a low to moderate intensity statin at various doses and may not reduce her cholesterol significantly.  We further discussed her diet today which she felt was very healthy.  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  She reports a very deep knowledge of healthy diet.  There is no documented ASCVD.  PMHx:  Past Medical History:  Diagnosis Date  . Cerebral aneurysm 1999   Surgically coiled  . DDD (degenerative disc disease)   . GERD (gastroesophageal reflux disease)   . History of Barrett's esophagus   . Hypercholesterolemia   . Hypertension   . Irritable bowel disease   . Sinus disease     Past  Surgical History:  Procedure Laterality Date  . ABDOMINAL HYSTERECTOMY  2005  . APPENDECTOMY    . ARTHROTOMY Right 12/11/2015   Procedure: OPEN ARTHROTOMY ;  Surgeon: Roseanne Kaufman, MD;  Location: [place];  Service: Orthopedics;  Laterality: Right;  . AUGMENTATION MAMMAPLASTY Bilateral   . BREAST ENHANCEMENT SURGERY    . CEREBRAL ANEURYSM REPAIR  1999  . DIAGNOSTIC LAPAROSCOPY    . LUMBAR DISC SURGERY Left 06/03/2010   Procedure: ANTEROLATERAL DECOMPRESSION AND ARTHRODESIS L3-4, L4-5  . NASAL SINUS SURGERY  2013  . ORIF ANKLE FRACTURE  03/01/2012   Procedure: OPEN REDUCTION INTERNAL FIXATION (ORIF) ANKLE FRACTURE;  Surgeon: Wylene Simmer, MD;  Location: [facility];  Service: Orthopedics;  Laterality: Right;  Open reduction internal fixation right navicular fracture  . REFRACTIVE SURGERY    . SHOULDER ARTHROSCOPY Left   . SYNOVECTOMY Right 12/11/2015   Procedure: SYNOVECTOMY DISTAL RADIOULNAR JOINT LOOSE BODY REMOVAL ;  Surgeon: Roseanne Kaufman, MD;  Location: [place];  Service: Orthopedics;  Laterality: Right;  . WRIST ARTHROSCOPY Right 12/11/2015   Procedure: RIGHT WRIST ARTHROSCOPY WITH DEBRIDEMENT ;  Surgeon: Roseanne Kaufman, MD;  Location: [place];  Service: Orthopedics;  Laterality: Right;    FAMHx:  Family History  Problem Relation Age of Onset  . Hypertension Father   . Hypertension Mother   . Osteopenia Mother   . Osteopenia Maternal Grandmother     SOCHx:   reports that she quit smoking about 33 years ago. She has never  used smokeless tobacco. She reports that she drinks alcohol. She reports that she does not use drugs.  ALLERGIES:  Allergies  Allergen Reactions  . Hydrocodone-Acetaminophen Itching  . Morphine And Related Nausea Only    Nausea and vomitting  . Tetracyclines & Related     Break out on all extremties    ROS: Pertinent items noted in HPI and remainder of comprehensive ROS otherwise  negative.  HOME MEDS: Current Outpatient Medications on File Prior to Visit  Medication Sig Dispense Refill  . atenolol (TENORMIN) 50 MG tablet Take 1 tablet (50 mg total) by mouth at bedtime. 90 tablet 3  . Calcium Citrate-Vitamin D 315-250 MG-UNIT TABS Take 1 tablet by mouth 2 (two) times daily.    . diclofenac (VOLTAREN) 75 MG EC tablet Take 75 mg by mouth 2 (two) times daily.    . hydrochlorothiazide (HYDRODIURIL) 25 MG tablet Take 1 tablet (25 mg total) by mouth daily. 90 tablet 3  . losartan (COZAAR) 50 MG tablet TAKE 1 TABLET (50 MG TOTAL) BY MOUTH DAILY. 90 tablet 0  . Omega-3 Fatty Acids (FISH OIL) 1000 MG CAPS Take 1,400 mg by mouth.     . pantoprazole (PROTONIX) 40 MG tablet Take 1 tablet (40 mg total) by mouth daily. 90 tablet 3  . Pitavastatin Calcium (LIVALO) 1 MG TABS Take 1 tablet (1 mg total) by mouth daily. 30 tablet 5  . valACYclovir (VALTREX) 500 MG tablet Take 500 mg by mouth 2 (two) times daily.    . Vitamin D, Ergocalciferol, (DRISDOL) 50000 units CAPS capsule TAKE 1 CAPSULE (50,000 UNITS TOTAL) BY MOUTH EVERY 7 (SEVEN) DAYS. 12 capsule 3   No current facility-administered medications on file prior to visit.     LABS/IMAGING: No results found for this or any previous visit (from the past 48 hour(s)). No results found.  LIPID PANEL:    Component Value Date/Time   CHOL 253 (H) 11/21/2017 0913   TRIG 290.0 (H) 11/21/2017 0913   HDL 45.10 11/21/2017 0913   CHOLHDL 6 11/21/2017 0913   VLDL 58.0 (H) 11/21/2017 0913   LDLCALC 104 (H) 09/01/2017 1011   LDLDIRECT 146.0 11/21/2017 0913     WEIGHTS: Wt Readings from Last 3 Encounters:  03/22/18 166 lb 6.4 oz (75.5 kg)  12/18/17 163 lb (73.9 kg)  11/21/17 163 lb 9.6 oz (74.2 kg)    VITALS: BP 125/77   Pulse 72   Ht 5' 9"  (1.753 m)   Wt 166 lb 6.4 oz (75.5 kg)   BMI 24.57 kg/m   EXAM: Deferred  EKG: Deferred  ASSESSMENT: 1. Dyslipidemia -low 10-year risk of 5% 2. Family history of dyslipidemia and  hypertension 3. Hypertension-controlled 4. NAFLD  PLAN: 1.   [name] [name] has fatty liver disease and has had significant elevation in liver enzymes on atorvastatin.  She seems to be tolerating Livalo without any marked increase in her liver enzymes.  She has not had repeat lipid testing in the last 3 months.  I like to repeat a fasting lipid profile and repeat liver enzymes.  In addition we discussed further evaluation for cardiac risk.  She is a good candidate for coronary artery calcium scoring.  This could further risk stratify her targets and help justify more aggressive treatment if necessary.  She is agreeable to this.  Follow-up with me after her CT scan to review her labs and further adjustments in her medications may be made at that time.  Pixie Casino, MD, [facility], FACP  [place] Director of the [facility] Lipid [facility] &  Cardiovascular Risk Reduction Clinic Diplomate of the American Board of Clinical Lipidology Attending Cardiologist  Direct Dial: [phone]  Fax: [phone]  Website:  [email or web address] 03/22/2018, 12:47 PM

## 2018-03-22 NOTE — Patient Instructions (Addendum)
Dr. Debara Pickett recommends that you return for lab work FASTING to check cholesterol & liver function   Dr. Debara Pickett has ordered a CT coronary calcium score. This test is done at [address]. This is $150 out of pocket cost.   Please schedule a follow up with Dr. Debara Pickett after your testing is complete in about 1 month. OK to double book if needed  Coronary CalciumScan A coronary calcium scan is an imaging test used to look for deposits of calcium and other fatty materials (plaques) in the inner lining of the blood vessels of the heart (coronary arteries). These deposits of calcium and plaques can partly clog and narrow the coronary arteries without producing any symptoms or warning signs. This puts a person at risk for a heart attack. This test can detect these deposits before symptoms develop. Tell a health care provider about:  Any allergies you have.  All medicines you are taking, including vitamins, herbs, eye drops, creams, and over-the-counter medicines.  Any problems you or family members have had with anesthetic medicines.  Any blood disorders you have.  Any surgeries you have had.  Any medical conditions you have.  Whether you are pregnant or may be pregnant. What are the risks? Generally, this is a safe procedure. However, problems may occur, including:  Harm to a pregnant woman and her unborn baby. This test involves the use of radiation. Radiation exposure can be dangerous to a pregnant woman and her unborn baby. If you are pregnant, you generally should not have this procedure done.  Slight increase in the risk of cancer. This is because of the radiation involved in the test. What happens before the procedure? No preparation is needed for this procedure. What happens during the procedure?  You will undress and remove any jewelry around your neck or chest.  You will put on a hospital gown.  Sticky electrodes will be placed on your chest. The electrodes will  be connected to an electrocardiogram (ECG) machine to record a tracing of the electrical activity of your heart.  A CT scanner will take pictures of your heart. During this time, you will be asked to lie still and hold your breath for 2-3 seconds while a picture of your heart is being taken. The procedure may vary among health care providers and hospitals. What happens after the procedure?  You can get dressed.  You can return to your normal activities.  It is up to you to get the results of your test. Ask your health care provider, or the department that is doing the test, when your results will be ready. Summary  A coronary calcium scan is an imaging test used to look for deposits of calcium and other fatty materials (plaques) in the inner lining of the blood vessels of the heart (coronary arteries).  Generally, this is a safe procedure. Tell your health care provider if you are pregnant or may be pregnant.  No preparation is needed for this procedure.  A CT scanner will take pictures of your heart.  You can return to your normal activities after the scan is done. This information is not intended to replace advice given to you by your health care provider. Make sure you discuss any questions you have with your health care provider. Document Released: 04/28/2008 Document Revised: 09/19/2016 Document Reviewed: 09/19/2016 Elsevier Interactive Patient Education  2017 [employer].

## 2018-04-02 ENCOUNTER — Ambulatory Visit (INDEPENDENT_AMBULATORY_CARE_PROVIDER_SITE_OTHER)
Admission: RE | Admit: 2018-04-02 | Discharge: 2018-04-02 | Disposition: A | Discharge status: Home / Self Care | Payer: Self-pay | Source: Ambulatory Visit | Attending: Internal Medicine | Admitting: Internal Medicine

## 2018-04-02 ENCOUNTER — Encounter: Payer: Self-pay | Admitting: Family Medicine

## 2018-04-02 DIAGNOSIS — D485 Neoplasm of uncertain behavior of skin: Secondary | ICD-10-CM | POA: Diagnosis not present

## 2018-04-02 DIAGNOSIS — L570 Actinic keratosis: Secondary | ICD-10-CM | POA: Diagnosis not present

## 2018-04-02 DIAGNOSIS — I10 Essential (primary) hypertension: Secondary | ICD-10-CM

## 2018-04-02 DIAGNOSIS — E785 Hyperlipidemia, unspecified: Secondary | ICD-10-CM

## 2018-04-02 DIAGNOSIS — L720 Epidermal cyst: Secondary | ICD-10-CM | POA: Diagnosis not present

## 2018-04-02 DIAGNOSIS — L821 Other seborrheic keratosis: Secondary | ICD-10-CM | POA: Diagnosis not present

## 2018-04-02 DIAGNOSIS — Z8249 Family history of ischemic heart disease and other diseases of the circulatory system: Secondary | ICD-10-CM

## 2018-04-10 ENCOUNTER — Ambulatory Visit: Payer: 59 | Admitting: Sports Medicine

## 2018-04-11 ENCOUNTER — Ambulatory Visit: Payer: 59 | Admitting: Physician Assistant

## 2018-04-11 ENCOUNTER — Other Ambulatory Visit: Payer: Self-pay | Admitting: *Deleted

## 2018-04-11 ENCOUNTER — Encounter: Payer: Self-pay | Admitting: Physician Assistant

## 2018-04-11 VITALS — BP 128/94 | HR 91 | Temp 97.8°F | Ht 69.0 in | Wt 166.2 lb

## 2018-04-11 DIAGNOSIS — M542 Cervicalgia: Secondary | ICD-10-CM | POA: Diagnosis not present

## 2018-04-11 DIAGNOSIS — M545 Low back pain, unspecified: Secondary | ICD-10-CM

## 2018-04-11 DIAGNOSIS — R918 Other nonspecific abnormal finding of lung field: Secondary | ICD-10-CM

## 2018-04-11 MED ORDER — METHYLPREDNISOLONE ACETATE 40 MG/ML IJ SUSP
40.0000 mg | Freq: Once | INTRAMUSCULAR | Status: AC
  Administered 2018-04-11: 40 mg via INTRAMUSCULAR

## 2018-04-11 MED ORDER — CYCLOBENZAPRINE HCL 5 MG PO TABS: 5.0000 mg | ORAL_TABLET | Freq: Three times a day (TID) | ORAL | 0 refills | Status: DC | PRN

## 2018-04-11 MED ORDER — KETOROLAC TROMETHAMINE 60 MG/2ML IM SOLN
60.0000 mg | Freq: Once | INTRAMUSCULAR | Status: AC
  Administered 2018-04-11: 30 mg via INTRAMUSCULAR

## 2018-04-11 MED ORDER — TRAMADOL HCL 50 MG PO TABS: 50.0000 mg | ORAL_TABLET | Freq: Four times a day (QID) | ORAL | 0 refills | Status: DC | PRN

## 2018-04-11 MED FILL — CYCLOBENZAPRINE 5 MG TABLET: 5 | 7 days supply | Qty: 20 | Fill #0

## 2018-04-11 MED FILL — traMADol HCL 50 MG TABS: 50 | 5 days supply | Qty: 20 | Fill #0

## 2018-04-11 NOTE — Progress Notes (Signed)
[name] [name] is a 57 y.o. female here for a recurrent problem.  I acted as a Education administrator for [employer], PA-C Anselmo Pickler, LPN  History of Present Illness:   Chief Complaint  Patient presents with  . Back Pain    h/o lower back surgery  . Neck Pain    pt states pain x 5 days    Back Pain  This is a recurrent problem. Episode onset: Started on Friday, surgery 2011. The problem occurs constantly. The problem has been gradually improving (Saw Chiropractor x 2 ) since onset. The pain is present in the lumbar spine. The quality of the pain is described as stabbing. The pain does not radiate. The pain is at a severity of 7/10. The pain is moderate. The pain is the same all the time. The symptoms are aggravated by twisting and position. Stiffness is present all day. Associated symptoms include bladder incontinence (if waits too long). Pertinent negatives include no abdominal pain, bowel incontinence, chest pain, dysuria, fever, headaches, leg pain, numbness, pelvic pain, tingling or weakness. Risk factors include menopause. She has tried chiropractic manipulation for the symptoms. The treatment provided moderate relief.  Neck Pain   This is a recurrent problem. Episode onset: Started on Sunday  The problem occurs constantly. The problem has been gradually worsening. The pain is associated with nothing. The pain is present in the anterior neck. Quality: Throbs. The pain is at a severity of 10/10. The pain is severe. The symptoms are aggravated by bending, twisting and position. The pain is same all the time. Stiffness is present all day. Associated symptoms include pain with swallowing. Pertinent negatives include no chest pain, fever, headaches, leg pain, numbness, syncope, tingling, trouble swallowing or weakness. She has tried muscle relaxants and ice (Had old med Flexeril took one,) for the symptoms. The treatment provided no relief.   Patient is frustrated because this is her second time this is  happened in the past 2 months.  She is wondering if she is going to require FMLA at some point for this.  She took some expired nortriptyline and Flexeril without improvement of symptoms.  She denies any urinary symptoms or fevers.  Symptoms are not worse with exertion, and denies chest pain.     Past Medical History:  Diagnosis Date  . Cerebral aneurysm 1999   Surgically coiled  . DDD (degenerative disc disease)   . GERD (gastroesophageal reflux disease)   . History of Barrett's esophagus   . Hypercholesterolemia   . Hypertension   . Irritable bowel disease   . Sinus disease      Social History   Socioeconomic History  . Marital status: Divorced    Spouse name: Not on file  . Number of children: Not on file  . Years of education: Not on file  . Highest education level: Not on file  Occupational History    Employer: [employer]  . Financial resource strain: Not on file  . Food insecurity:    Worry: Not on file    Inability: Not on file  . Transportation needs:    Medical: Not on file    Non-medical: Not on file  Tobacco Use  . Smoking status: Former Smoker    Last attempt to quit: 11/14/1984    Years since quitting: 33.4  . Smokeless tobacco: Never Used  Substance and Sexual Activity  . Alcohol use: Yes    Comment: social  . Drug use: No  .  Sexual activity: Not on file  Lifestyle  . Physical activity:    Days per week: Not on file    Minutes per session: Not on file  . Stress: Not on file  Relationships  . Social connections:    Talks on phone: Not on file    Gets together: Not on file    Attends religious service: Not on file    Active member of club or organization: Not on file    Attends meetings of clubs or organizations: Not on file    Relationship status: Not on file  . Intimate partner violence:    Fear of current or ex partner: Not on file    Emotionally abused: Not on file    Physically abused: Not on file    Forced sexual activity:  Not on file  Other Topics Concern  . Not on file  Social History Narrative   The patient works as a Marine scientist in the electrophysiology department. Her daughter and 43-monthold granddaughter live with her. She does not smoke cigarettes.    Past Surgical History:  Procedure Laterality Date  . ABDOMINAL HYSTERECTOMY  2005  . APPENDECTOMY    . ARTHROTOMY Right 12/11/2015   Procedure: OPEN ARTHROTOMY ;  Surgeon: WRoseanne Kaufman MD;  Location[place]  Service: Orthopedics;  Laterality: Right;  . AUGMENTATION MAMMAPLASTY Bilateral   . BREAST ENHANCEMENT SURGERY    . CEREBRAL ANEURYSM REPAIR  1999  . DIAGNOSTIC LAPAROSCOPY    . LUMBAR DISC SURGERY Left 06/03/2010   Procedure: ANTEROLATERAL DECOMPRESSION AND ARTHRODESIS L3-4, L4-5  . NASAL SINUS SURGERY  2013  . ORIF ANKLE FRACTURE  03/01/2012   Procedure: OPEN REDUCTION INTERNAL FIXATION (ORIF) ANKLE FRACTURE;  Surgeon: JWylene Simmer MD;  [place] [facility]  Service: Orthopedics;  Laterality: Right;  Open reduction internal fixation right navicular fracture  . REFRACTIVE SURGERY    . SHOULDER ARTHROSCOPY Left   . SYNOVECTOMY Right 12/11/2015   Procedure: SYNOVECTOMY DISTAL RADIOULNAR JOINT LOOSE BODY REMOVAL ;  Surgeon: WRoseanne Kaufman MD;  [place] [facility]  Service: Orthopedics;  Laterality: Right;  . WRIST ARTHROSCOPY Right 12/11/2015   Procedure: RIGHT WRIST ARTHROSCOPY WITH DEBRIDEMENT ;  Surgeon: WRoseanne Kaufman MD;  [place] [facility]  Service: Orthopedics;  Laterality: Right;    Family History  Problem Relation Age of Onset  . Hypertension Father   . Hypertension Mother   . Osteopenia Mother   . Osteopenia Maternal Grandmother     Allergies  Allergen Reactions  . Hydrocodone-Acetaminophen Itching  . Morphine And Related Nausea Only    Nausea and vomitting  . Tetracyclines & Related     Break out on all extremties    Current Medications:   Current  Outpatient Medications:  .  atenolol (TENORMIN) 50 MG tablet, Take 1 tablet (50 mg total) by mouth at bedtime., Disp: 90 tablet, Rfl: 3 .  Calcium Citrate-Vitamin D 315-250 MG-UNIT TABS, Take 1 tablet by mouth 2 (two) times daily., Disp: , Rfl:  .  cyclobenzaprine (FLEXERIL) 5 MG tablet, Take 1 tablet (5 mg total) by mouth 3 (three) times daily as needed for muscle spasms., Disp: 20 tablet, Rfl: 0 .  diclofenac (VOLTAREN) 75 MG EC tablet, Take 75 mg by mouth 2 (two) times daily., Disp: , Rfl:  .  hydrochlorothiazide (HYDRODIURIL) 25 MG tablet, Take 1 tablet (25 mg total) by mouth daily., Disp: 90 tablet, Rfl: 3 .  losartan (COZAAR) 50 MG tablet,  TAKE 1 TABLET (50 MG TOTAL) BY MOUTH DAILY., Disp: 90 tablet, Rfl: 0 .  Omega-3 Fatty Acids (FISH OIL) 1000 MG CAPS, Take 1,400 mg by mouth. , Disp: , Rfl:  .  pantoprazole (PROTONIX) 40 MG tablet, Take 1 tablet (40 mg total) by mouth daily., Disp: 90 tablet, Rfl: 3 .  Pitavastatin Calcium (LIVALO) 1 MG TABS, Take 1 tablet (1 mg total) by mouth daily., Disp: 30 tablet, Rfl: 5 .  traMADol (ULTRAM) 50 MG tablet, Take 1 tablet (50 mg total) by mouth every 6 (six) hours as needed., Disp: 20 tablet, Rfl: 0 .  valACYclovir (VALTREX) 500 MG tablet, Take 500 mg by mouth 2 (two) times daily., Disp: , Rfl:  .  Vitamin D, Ergocalciferol, (DRISDOL) 50000 units CAPS capsule, TAKE 1 CAPSULE (50,000 UNITS TOTAL) BY MOUTH EVERY 7 (SEVEN) DAYS., Disp: 12 capsule, Rfl: 3   Review of Systems:   Review of Systems  Constitutional: Negative for fever.  HENT: Negative for trouble swallowing.   Cardiovascular: Negative for chest pain and syncope.  Gastrointestinal: Negative for abdominal pain and bowel incontinence.  Genitourinary: Positive for bladder incontinence (if waits too long). Negative for dysuria and pelvic pain.  Musculoskeletal: Positive for back pain and neck pain.  Neurological: Negative for tingling, weakness, numbness and headaches.    Vitals:   Vitals:    04/11/18 1035  BP: (!) 128/94  Pulse: 91  Temp: 97.8 F (36.6 C)  TempSrc: Oral  SpO2: 99%  Weight: 166 lb 3.2 oz (75.4 kg)  Height: 5' 9"  (1.753 m)     Body mass index is 24.54 kg/m.  Physical Exam:   Physical Exam  Constitutional: She appears well-developed. She is cooperative.  Non-toxic appearance. She does not have a sickly appearance. She does not appear ill. No distress.  Cardiovascular: Normal rate, regular rhythm, S1 normal, S2 normal, normal heart sounds and normal pulses.  No LE edema  Pulmonary/Chest: Effort normal and breath sounds normal.  Musculoskeletal:  Unable to perform full exam as patient is in severe pain today.  Unable to rotate her head. No cervical spine tenderness.  Decreased ROM 2/2 pain with flexion/extension, lateral side bends, or rotation. Reproducible tenderness with deep palpation to right lumbar paraspinal muscles. No bony tenderness. No evidence of erythema, rash or ecchymosis.    Neurological: She is alert. GCS eye subscore is 4. GCS verbal subscore is 5. GCS motor subscore is 6.  Normal sensation in bilateral UE and LE  Skin: Skin is warm, dry and intact.  Psychiatric: She has a normal mood and affect. Her speech is normal and behavior is normal.  Nursing note and vitals reviewed.   Assessment and Plan:    [name] [name] was seen today for back pain and neck pain.  Diagnoses and all orders for this visit:  Neck pain -     ketorolac (TORADOL) injection 60 mg -     methylPREDNISolone acetate (DEPO-MEDROL) injection 40 mg  Acute right-sided low back pain without sciatica -     ketorolac (TORADOL) injection 60 mg -     methylPREDNISolone acetate (DEPO-MEDROL) injection 40 mg  Other orders -     cyclobenzaprine (FLEXERIL) 5 MG tablet; Take 1 tablet (5 mg total) by mouth 3 (three) times daily as needed for muscle spasms. -     traMADol (ULTRAM) 50 MG tablet; Take 1 tablet (50 mg total) by mouth every 6 (six) hours as needed.   Patient  received Toradol and Depo-Medrol injection in office,  tolerated well.  I have also refilled her Flexeril.  I have also given her prescription for tramadol should she need it.  She is going to see her chiropractor later today.  I recommended that she consider scheduling with Dr. Teresa Coombs here in our office for further evaluation and treatment if chiropractor does not provide relief. Patient is agreeable to plan.  Also discussed plan with Dr. Briscoe Deutscher.  Reviewed expectations re: course of current medical issues. Discussed self-management of symptoms. Outlined signs and symptoms indicating need for more acute intervention. Patient verbalized understanding and all questions were answered. See orders for this visit as documented in the electronic medical record. Patient received an After-Visit Summary.  CMA or LPN served as scribe during this visit. History, Physical, and Plan performed by medical provider. Documentation and orders reviewed and attested to.   Inda Coke, PA-C

## 2018-04-11 NOTE — Patient Instructions (Signed)
It was great to see you.  Keep [place] posted on your symptoms -- consider seeing Dr. Paulla Fore for further evaluation and treatment (he is Dr.  Thedore Mins Smith's counterpart here at our office.)

## 2018-04-23 ENCOUNTER — Other Ambulatory Visit: Payer: Self-pay | Admitting: Family Medicine

## 2018-04-23 MED FILL — VIT D2 1.25 MG (50,000 UNIT: 1.25 MG | 84 days supply | Qty: 12 | Fill #2

## 2018-04-23 MED FILL — PANTOPRAZOLE SOD DR 40 MG T: 40 | 90 days supply | Qty: 90 | Fill #2

## 2018-04-23 MED FILL — ATENOLOL 50 MG TABLET: 50 | 90 days supply | Qty: 90 | Fill #2

## 2018-04-23 MED FILL — HYDROCHLOROTHIAZIDE 25 MG T: 25 | 90 days supply | Qty: 90 | Fill #2

## 2018-04-23 MED FILL — LIVALO 1 MG TABLET: 1 | 30 days supply | Qty: 30 | Fill #4

## 2018-04-24 ENCOUNTER — Other Ambulatory Visit: Payer: Self-pay | Admitting: Surgical

## 2018-04-24 ENCOUNTER — Other Ambulatory Visit: Payer: 59

## 2018-04-24 ENCOUNTER — Telehealth: Payer: Self-pay | Admitting: Family Medicine

## 2018-04-24 DIAGNOSIS — E785 Hyperlipidemia, unspecified: Secondary | ICD-10-CM | POA: Diagnosis not present

## 2018-04-24 DIAGNOSIS — K760 Fatty (change of) liver, not elsewhere classified: Secondary | ICD-10-CM | POA: Diagnosis not present

## 2018-04-24 LAB — HEPATIC FUNCTION PANEL
ALK PHOS: 84 IU/L (ref 39–117)
ALT: 32 IU/L (ref 0–32)
AST: 35 IU/L (ref 0–40)
Albumin: 4.9 g/dL (ref 3.5–5.5)
BILIRUBIN, DIRECT: 0.11 mg/dL (ref 0.00–0.40)
Bilirubin Total: 0.3 mg/dL (ref 0.0–1.2)
Total Protein: 7.6 g/dL (ref 6.0–8.5)

## 2018-04-24 LAB — LIPID PANEL
Chol/HDL Ratio: 3.7 ratio (ref 0.0–4.4)
Cholesterol, Total: 271 mg/dL — ABNORMAL HIGH (ref 100–199)
HDL: 73 mg/dL (ref >39)
LDL Calculated: 167 mg/dL — ABNORMAL HIGH (ref 0–99)
TRIGLYCERIDES: 154 mg/dL — AB (ref 0–149)
VLDL Cholesterol Cal: 31 mg/dL (ref 5–40)

## 2018-04-24 MED ORDER — LOSARTAN POTASSIUM 50 MG PO TABS: 50.0000 mg | ORAL_TABLET | Freq: Every day | ORAL | 0 refills | Status: DC

## 2018-04-24 MED FILL — LOSARTAN POTASSIUM 50 MG TA: 50 | 90 days supply | Qty: 90 | Fill #0

## 2018-04-24 NOTE — Telephone Encounter (Signed)
Not sure why prescription was refused on 04/23/18. I refilled prescription on 04/24/18 AM. I have spoke with the patient and she has already picked up medication.

## 2018-04-24 NOTE — Telephone Encounter (Signed)
See note

## 2018-04-24 NOTE — Telephone Encounter (Signed)
Copied from [place] [id number]. Topic: Quick Communication - See Telephone Encounter >> Apr 24, 2018  8:19 AM Marja Kays F wrote: Pt is needing to know why she has been denied on her losartan  Best number to call cell -[phone]  or work [phone]

## 2018-04-24 NOTE — Telephone Encounter (Signed)
Please review for losartan.

## 2018-05-01 ENCOUNTER — Ambulatory Visit: Payer: 59 | Admitting: Internal Medicine

## 2018-05-01 ENCOUNTER — Encounter: Payer: Self-pay | Admitting: Internal Medicine

## 2018-05-01 VITALS — BP 145/88 | HR 62 | Ht 69.0 in | Wt 165.0 lb

## 2018-05-01 DIAGNOSIS — E785 Hyperlipidemia, unspecified: Secondary | ICD-10-CM

## 2018-05-01 DIAGNOSIS — R918 Other nonspecific abnormal finding of lung field: Secondary | ICD-10-CM | POA: Diagnosis not present

## 2018-05-01 DIAGNOSIS — K760 Fatty (change of) liver, not elsewhere classified: Secondary | ICD-10-CM | POA: Diagnosis not present

## 2018-05-01 MED ORDER — PITAVASTATIN CALCIUM 1 MG PO TABS: 2.0000 mg | ORAL_TABLET | Freq: Every day | ORAL | 1 refills | Status: DC

## 2018-05-01 NOTE — Patient Instructions (Signed)
Medication Instructions:   INCREASE livalo to 34m daily  Labwork:  FASTING labs in 3 months - lipid, liver  Testing/Procedures:  NONE  Follow-Up:  Your physician recommends that you schedule a follow-up appointment in 3 months with Dr. HDebara Pickett(lipid clinic)   If you need a refill on your cardiac medications before your next appointment, please call your pharmacy.  Any Other Special Instructions Will Be Listed Below (If Applicable).

## 2018-05-01 NOTE — Progress Notes (Signed)
OFFICE NOTE  Chief Complaint:  Follow-up dyslipidemia  Primary Care Physician: Briscoe Deutscher, DO  HPI:  [name] [name] is a 57 y.o. female with a past medial history significant for cerebral aneurysm, hypertension, dyslipidemia and family history of hypertension and dyslipidemia, but no significant early onset heart disease.  She was noted to have dyslipidemia in the past and this was monitored.  When she changed primary care providers she was started on statin therapy which caused a marked reduction in cholesterol.  The time this was atorvastatin 10 mg, however she had significant elevation of liver enzymes.  An abdominal ultrasound in 2018 demonstrated fatty liver disease is the likely cause of this.  After stopping the statin therapy her liver enzymes returned to baseline which was borderline abnormal.  She was seen by [name] [name], PharmD, for evaluation of her dyslipidemia as a new patient. [name] [name] recommended trying Livalo 1 mg daily.  [name] [name] reports compliance with this medication and had liver enzyme testing 2 months ago which indicated stable liver enzymes.  I suspect she is tolerating this well as the statin is predominantly glucuronidated.  However, it is a low to moderate intensity statin at various doses and may not reduce her cholesterol significantly.  We further discussed her diet today which she felt was very healthy.  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  She reports a very deep knowledge of healthy diet.  There is no documented ASCVD.  05/01/2018  [name] [name] returns today for follow-up.  She has aggressively tried to alter her diet as well as maintain compliance with Livalo 1 mg daily.  We repeated recent lab work which indicates reduction in cholesterol the 271, triglycerides were 154 (reduced from 290), HDL increased from 45-73 and calculated LDL  was 167 (up from 146).  This does represent an increase LDL, however she has had particle shifting.  Liver enzymes have remained stable he mild elevated less than 2 times the upper limit of normal.  She underwent coronary artery calcium scoring, which demonstrated a calcium score of 6.  There was some scattered sub-pulmonary nodules and I would recommend a repeat CT scan without contrast in 1 year.  PMHx:  Past Medical History:  Diagnosis Date  . Cerebral aneurysm 1999   Surgically coiled  . DDD (degenerative disc disease)   . GERD (gastroesophageal reflux disease)   . History of Barrett's esophagus   . Hypercholesterolemia   . Hypertension   . Irritable bowel disease   . Sinus disease     Past Surgical History:  Procedure Laterality Date  . ABDOMINAL HYSTERECTOMY  2005  . APPENDECTOMY    . ARTHROTOMY Right 12/11/2015   Procedure: OPEN ARTHROTOMY ;  Surgeon: Roseanne Kaufman, MD;  Location: [facility];  Service: Orthopedics;  Laterality: Right;  . AUGMENTATION MAMMAPLASTY Bilateral   . BREAST ENHANCEMENT SURGERY    . CEREBRAL ANEURYSM REPAIR  1999  . DIAGNOSTIC LAPAROSCOPY    . LUMBAR DISC SURGERY Left 06/03/2010   Procedure: ANTEROLATERAL DECOMPRESSION AND ARTHRODESIS L3-4, L4-5  . NASAL SINUS SURGERY  2013  . ORIF ANKLE FRACTURE  03/01/2012   Procedure: OPEN REDUCTION INTERNAL FIXATION (ORIF) ANKLE FRACTURE;  Surgeon: Wylene Simmer, MD;  Location: [facility];  Service: Orthopedics;  Laterality: Right;  Open reduction internal fixation right navicular fracture  . REFRACTIVE SURGERY    .  SHOULDER ARTHROSCOPY Left   . SYNOVECTOMY Right 12/11/2015   Procedure: SYNOVECTOMY DISTAL RADIOULNAR JOINT LOOSE BODY REMOVAL ;  Surgeon: Roseanne Kaufman, MD;  Location: [facility];  Service: Orthopedics;  Laterality: Right;  . WRIST ARTHROSCOPY Right 12/11/2015   Procedure: RIGHT WRIST ARTHROSCOPY WITH DEBRIDEMENT ;  Surgeon: Roseanne Kaufman, MD;  Location:  [facility];  Service: Orthopedics;  Laterality: Right;    FAMHx:  Family History  Problem Relation Age of Onset  . Hypertension Father   . Hypertension Mother   . Osteopenia Mother   . Osteopenia Maternal Grandmother     SOCHx:   reports that she quit smoking about 33 years ago. She has never used smokeless tobacco. She reports that she drinks alcohol. She reports that she does not use drugs.  ALLERGIES:  Allergies  Allergen Reactions  . Hydrocodone-Acetaminophen Itching  . Morphine And Related Nausea Only    Nausea and vomitting  . Tetracyclines & Related     Break out on all extremties    ROS: Pertinent items noted in HPI and remainder of comprehensive ROS otherwise negative.  HOME MEDS: Current Outpatient Medications on File Prior to Visit  Medication Sig Dispense Refill  . atenolol (TENORMIN) 50 MG tablet Take 1 tablet (50 mg total) by mouth at bedtime. 90 tablet 3  . Calcium Citrate-Vitamin D 315-250 MG-UNIT TABS Take 1 tablet by mouth 2 (two) times daily.    . cyclobenzaprine (FLEXERIL) 5 MG tablet Take 1 tablet (5 mg total) by mouth 3 (three) times daily as needed for muscle spasms. 20 tablet 0  . diclofenac (VOLTAREN) 75 MG EC tablet Take 75 mg by mouth 2 (two) times daily.    . hydrochlorothiazide (HYDRODIURIL) 25 MG tablet Take 1 tablet (25 mg total) by mouth daily. 90 tablet 3  . losartan (COZAAR) 50 MG tablet Take 1 tablet (50 mg total) by mouth daily. 90 tablet 0  . Omega-3 Fatty Acids (FISH OIL) 1000 MG CAPS Take 1,400 mg by mouth.     . pantoprazole (PROTONIX) 40 MG tablet Take 1 tablet (40 mg total) by mouth daily. 90 tablet 3  . Pitavastatin Calcium (LIVALO) 1 MG TABS Take 1 tablet (1 mg total) by mouth daily. 30 tablet 5  . traMADol (ULTRAM) 50 MG tablet Take 1 tablet (50 mg total) by mouth every 6 (six) hours as needed. 20 tablet 0  . valACYclovir (VALTREX) 500 MG tablet Take 500 mg by mouth 2 (two) times daily.    . Vitamin D,  Ergocalciferol, (DRISDOL) 50000 units CAPS capsule TAKE 1 CAPSULE (50,000 UNITS TOTAL) BY MOUTH EVERY 7 (SEVEN) DAYS. 12 capsule 3   No current facility-administered medications on file prior to visit.     LABS/IMAGING: No results found for this or any previous visit (from the past 48 hour(s)). No results found.  LIPID PANEL:    Component Value Date/Time   CHOL 271 (H) 04/24/2018 0000   TRIG 154 (H) 04/24/2018 0000   HDL 73 04/24/2018 0000   CHOLHDL 3.7 04/24/2018 0000   CHOLHDL 6 11/21/2017 0913   VLDL 58.0 (H) 11/21/2017 0913   LDLCALC 167 (H) 04/24/2018 0000   LDLDIRECT 146.0 11/21/2017 0913     WEIGHTS: Wt Readings from Last 3 Encounters:  05/01/18 165 lb (74.8 kg)  04/11/18 166 lb 3.2 oz (75.4 kg)  03/22/18 166 lb 6.4 oz (75.5 kg)    VITALS: BP (!) 145/88   Pulse 62   Ht 5' 9"  (  1.753 m)   Wt 165 lb (74.8 kg)   BMI 24.37 kg/m   EXAM: Deferred  EKG: Deferred  ASSESSMENT: 1. Dyslipidemia -low 10-year risk of 5% 2. CAC of 6 (03/2018) 3. Family history of dyslipidemia and hypertension 4. Hypertension-controlled 5. NAFLD 6. Subcentimeter pulmonary nodules  PLAN: 1.   Mrs. Fei has had improvement in her dyslipidemia with marked reduction in triglycerides, increase in HDL and mild increase in LDL-calculated.  I suspect particle numbers are better and her LDL is higher due to particle shifting.  Nonetheless, her direct LDL in the past was 147 and is still elevated at 167.  I recommend increasing her Livalo to 2 mg and monitoring her liver enzymes.  If we do not reach goal we could consider adding ezetimibe.  We will check a lipoprotein NMR with lipid profile in 3 months.  Follow-up with me at that time.  Repeat CT scan for pulmonary nodules in 1 year.  Pixie Casino, MD, [facility], [place] Director of the Advanced Lipid Disorders &  Cardiovascular Risk Reduction Clinic Diplomate of the American Board of Clinical  Lipidology Attending Cardiologist  Direct Dial: [phone]  Fax: [phone]  Website:  [email or web address] 05/01/2018, 10:33 AM

## 2018-05-15 ENCOUNTER — Telehealth: Payer: Self-pay | Admitting: Internal Medicine

## 2018-05-15 MED ORDER — PITAVASTATIN MAGNESIUM 2 MG PO TABS: 2.0000 mg | ORAL_TABLET | Freq: Every day | ORAL | 3 refills | Status: DC

## 2018-05-15 MED FILL — LIVALO 2 MG TABLET: 2 | 90 days supply | Qty: 90 | Fill #0

## 2018-05-15 NOTE — Telephone Encounter (Signed)
Rx(s) sent to pharmacy electronically for different tablet strength per request/insurance requirement

## 2018-05-15 NOTE — Telephone Encounter (Signed)
New Message    Pt c/o medication issue:  1. Name of Medication: Pitavastatin Calcium (LIVALO) 1 MG TABS  2. How are you currently taking this medication (dosage and times per day)? Take 2 tablets (2 mg total) by mouth daily  3. Are you having a reaction (difficulty breathing--STAT)? no  4. What is your medication issue? [name] [name] want to know if the dosage can be changed to 6m due to insurance only covering the pt taking it once a day. Please call

## 2018-05-31 DIAGNOSIS — M545 Low back pain: Secondary | ICD-10-CM | POA: Diagnosis not present

## 2018-05-31 DIAGNOSIS — I10 Essential (primary) hypertension: Secondary | ICD-10-CM | POA: Diagnosis not present

## 2018-05-31 DIAGNOSIS — G8929 Other chronic pain: Secondary | ICD-10-CM | POA: Diagnosis not present

## 2018-05-31 DIAGNOSIS — M47812 Spondylosis without myelopathy or radiculopathy, cervical region: Secondary | ICD-10-CM | POA: Diagnosis not present

## 2018-05-31 DIAGNOSIS — Z6825 Body mass index (BMI) 25.0-25.9, adult: Secondary | ICD-10-CM | POA: Diagnosis not present

## 2018-06-04 ENCOUNTER — Other Ambulatory Visit (HOSPITAL_COMMUNITY): Payer: Self-pay | Admitting: Neurological Surgery

## 2018-06-04 DIAGNOSIS — M545 Low back pain: Secondary | ICD-10-CM

## 2018-06-04 DIAGNOSIS — G8929 Other chronic pain: Secondary | ICD-10-CM

## 2018-06-04 DIAGNOSIS — M47812 Spondylosis without myelopathy or radiculopathy, cervical region: Secondary | ICD-10-CM

## 2018-06-05 MED FILL — diazePAM 10 MG TABS: 10 | 1 days supply | Qty: 1 | Fill #0

## 2018-06-07 ENCOUNTER — Ambulatory Visit: Payer: 59

## 2018-06-08 ENCOUNTER — Ambulatory Visit (HOSPITAL_COMMUNITY)
Admission: RE | Admit: 2018-06-08 | Discharge: 2018-06-08 | Disposition: A | Discharge status: Home / Self Care | Payer: 59 | Source: Ambulatory Visit | Attending: Neurological Surgery | Admitting: Neurological Surgery

## 2018-06-08 DIAGNOSIS — M5126 Other intervertebral disc displacement, lumbar region: Secondary | ICD-10-CM | POA: Diagnosis not present

## 2018-06-08 DIAGNOSIS — M545 Low back pain: Secondary | ICD-10-CM | POA: Insufficient documentation

## 2018-06-08 DIAGNOSIS — G8929 Other chronic pain: Secondary | ICD-10-CM | POA: Insufficient documentation

## 2018-06-08 DIAGNOSIS — M47812 Spondylosis without myelopathy or radiculopathy, cervical region: Secondary | ICD-10-CM | POA: Insufficient documentation

## 2018-06-08 DIAGNOSIS — M5125 Other intervertebral disc displacement, thoracolumbar region: Secondary | ICD-10-CM | POA: Insufficient documentation

## 2018-06-08 DIAGNOSIS — M50322 Other cervical disc degeneration at C5-C6 level: Secondary | ICD-10-CM | POA: Diagnosis not present

## 2018-06-08 DIAGNOSIS — Z981 Arthrodesis status: Secondary | ICD-10-CM | POA: Diagnosis not present

## 2018-06-08 DIAGNOSIS — M2578 Osteophyte, vertebrae: Secondary | ICD-10-CM | POA: Insufficient documentation

## 2018-06-08 DIAGNOSIS — M4802 Spinal stenosis, cervical region: Secondary | ICD-10-CM | POA: Diagnosis not present

## 2018-06-08 LAB — CREATININE, SERUM
Creatinine, Ser: 0.68 mg/dL (ref 0.44–1.00)
GFR calc non Af Amer: >60 mL/min (ref >60)

## 2018-06-08 MED ORDER — GADOBENATE DIMEGLUMINE 529 MG/ML IV SOLN
15.0000 mL | Freq: Once | INTRAVENOUS | Status: AC
  Administered 2018-06-08: 15 mL via INTRAVENOUS

## 2018-06-20 ENCOUNTER — Encounter: Payer: Self-pay | Admitting: Family Medicine

## 2018-06-20 ENCOUNTER — Ambulatory Visit: Payer: 59 | Admitting: Family Medicine

## 2018-06-20 VITALS — BP 136/92 | HR 84 | Temp 98.4°F | Wt 167.8 lb

## 2018-06-20 DIAGNOSIS — Q688 Other specified congenital musculoskeletal deformities: Secondary | ICD-10-CM

## 2018-06-20 DIAGNOSIS — E782 Mixed hyperlipidemia: Secondary | ICD-10-CM

## 2018-06-20 DIAGNOSIS — R5381 Other malaise: Secondary | ICD-10-CM | POA: Diagnosis not present

## 2018-06-20 DIAGNOSIS — G8929 Other chronic pain: Secondary | ICD-10-CM

## 2018-06-20 DIAGNOSIS — M542 Cervicalgia: Secondary | ICD-10-CM

## 2018-06-20 DIAGNOSIS — E559 Vitamin D deficiency, unspecified: Secondary | ICD-10-CM | POA: Diagnosis not present

## 2018-06-20 DIAGNOSIS — R5383 Other fatigue: Secondary | ICD-10-CM | POA: Diagnosis not present

## 2018-06-20 DIAGNOSIS — Q740 Other congenital malformations of upper limb(s), including shoulder girdle: Secondary | ICD-10-CM

## 2018-06-20 NOTE — Progress Notes (Signed)
[name] [name] is a 57 y.o. female here for an acute visit.  History of Present Illness:   HPI:   1. Malaise and fatigue. Feeling more tired lately.    2. Mixed hyperlipidemia. Stable on current treatment. Followed by the lipid clinic.   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     4. Neck pain, chronic. Followed by [facility].    PMHx, SurgHx, SocialHx, Medications, and Allergies were reviewed in the Visit Navigator and updated as appropriate.  Current Medications:   .  atenolol (TENORMIN) 50 MG tablet, Take 1 tablet (50 mg total) by mouth at bedtime., Disp: 90 tablet, Rfl: 3 .  Calcium Citrate-Vitamin D 315-250 MG-UNIT TABS, Take 1 tablet by mouth 2 (two) times daily., Disp: , Rfl:  .  cyclobenzaprine (FLEXERIL) 5 MG tablet, Take 1 tablet (5 mg total) by mouth 3 (three) times daily as needed for muscle spasms .  diclofenac (VOLTAREN) 75 MG EC tablet, Take 75 mg by mouth 2 (two) times daily., Disp: , Rfl:  .  hydrochlorothiazide (HYDRODIURIL) 25 MG tablet, Take 1 tablet (25 mg total) by mouth daily., Disp: 90 tablet, Rfl: 3 .  losartan (COZAAR) 50 MG tablet, Take 1 tablet (50 mg total) by mouth daily., Disp: 90 tablet, Rfl: 0 .  Omega-3 Fatty Acids (FISH OIL) 1000 MG CAPS, Take 1,400 mg by mouth. , Disp: , Rfl:  .  pantoprazole (PROTONIX) 40 MG tablet, Take 1 tablet (40 mg total) by mouth daily., Disp: 90 tablet, Rfl: 3 .  Pitavastatin Magnesium 2 MG TABS, Take 2 mg by mouth daily., Disp: 90 tablet, Rfl: 3 .  traMADol (ULTRAM) 50 MG tablet, Take 1 tablet (50 mg total) by mouth every 6 (six) hours as needed., Disp: 20 tablet, Rfl: 0 .  valACYclovir (VALTREX) 500 MG tablet, Take 500 mg by mouth 2 (two) times daily., Disp: , Rfl:  .  Vitamin D, Ergocalciferol, (DRISDOL) 50000 units CAPS capsule, TAKE 1 CAPSULE (50,000 UNITS TOTAL) BY MOUTH EVERY 7 (SEVEN) DAYS., Disp: 12 capsule, Rfl: 3   Allergies  Allergen Reactions  .  Hydrocodone-Acetaminophen Itching  . Morphine And Related Nausea Only    Nausea and vomitting  . Tetracyclines & Related     Break out on all extremties   Review of Systems:   Pertinent items are noted in the HPI. Otherwise, ROS is negative.  Vitals:   Vitals:   06/20/18 1445  BP: (!) 136/92  Pulse: 84  Temp: 98.4 F (36.9 C)  TempSrc: Oral  SpO2: 99%  Weight: 167 lb 12.8 oz (76.1 kg)     Body mass index is 24.78 kg/m.  Physical Exam:   Physical Exam  Constitutional: She appears well-nourished.  HENT:  Head: Normocephalic and atraumatic.  Eyes: Pupils are equal, round, and reactive to light. EOM are normal.  Neck: Normal range of motion. Neck supple.  Cardiovascular: Normal rate, regular rhythm, normal heart sounds and intact distal pulses.  Pulmonary/Chest: Effort normal.  Abdominal: Soft.  Skin: Skin is warm.  Psychiatric: She has a normal mood and affect. Her behavior is normal.  Nursing note and vitals reviewed.  Assessment and Plan:   Diagnoses and all orders for this visit:  Malaise and fatigue -     CBC with Differential/Platelet -     Comprehensive metabolic panel -     TSH -     Vitamin B12 -     Iron, TIBC and Ferritin  Panel  Vitamin D deficiency  Mixed hyperlipidemia  Anomaly of clavicle Comments: No lymphadenopathy. Likely prominent compared to left due to previous surgery.  Neck pain, chronic Comments: Sees chiropractor.   . Reviewed expectations re: course of current medical issues. . Discussed self-management of symptoms. . Outlined signs and symptoms indicating need for more acute intervention. . Patient verbalized understanding and all questions were answered. Marland Kitchen Health Maintenance issues including appropriate healthy diet, exercise, and smoking avoidance were discussed with patient. . See orders for this visit as documented in the electronic medical record. . Patient received an After Visit Summary.  Briscoe Deutscher, DO [place],  [place] [facility] 06/24/2018

## 2018-06-21 LAB — IRON,TIBC AND FERRITIN PANEL
%SAT: 28 % (calc) (ref 16–45)
Ferritin: 194 ng/mL (ref 16–232)
Iron: 106 ug/dL (ref 45–160)
TIBC: 374 mcg/dL (calc) (ref 250–450)

## 2018-06-21 LAB — COMPREHENSIVE METABOLIC PANEL
ALT: 21 U/L (ref 0–35)
AST: 26 U/L (ref 0–37)
Albumin: 4.7 g/dL (ref 3.5–5.2)
Alkaline Phosphatase: 75 U/L (ref 39–117)
BUN: 19 mg/dL (ref 6–23)
CO2: 32 mEq/L (ref 19–32)
Calcium: 9.9 mg/dL (ref 8.4–10.5)
Chloride: 98 mEq/L (ref 96–112)
Creatinine, Ser: 0.65 mg/dL (ref 0.40–1.20)
GFR: 99.16 mL/min (ref >60.00)
Glucose, Bld: 115 mg/dL — ABNORMAL HIGH (ref 70–99)
Potassium: 3.6 mEq/L (ref 3.5–5.1)
Sodium: 141 mEq/L (ref 135–145)
Total Bilirubin: 0.5 mg/dL (ref 0.2–1.2)
Total Protein: 7.6 g/dL (ref 6.0–8.3)

## 2018-06-21 LAB — CBC WITH DIFFERENTIAL/PLATELET
Basophils Absolute: 0.1 10*3/uL (ref 0.0–0.1)
Basophils Relative: 1.3 % (ref 0.0–3.0)
Eosinophils Absolute: 0.1 10*3/uL (ref 0.0–0.7)
Eosinophils Relative: 1.3 % (ref 0.0–5.0)
HCT: 35.9 % — ABNORMAL LOW (ref 36.0–46.0)
Hemoglobin: 12.5 g/dL (ref 12.0–15.0)
Lymphocytes Relative: 29.4 % (ref 12.0–46.0)
Lymphs Abs: 2.1 10*3/uL (ref 0.7–4.0)
MCHC: 34.8 g/dL (ref 30.0–36.0)
MCV: 98.3 fl (ref 78.0–100.0)
Monocytes Absolute: 0.6 10*3/uL (ref 0.1–1.0)
Monocytes Relative: 8.1 % (ref 3.0–12.0)
Neutro Abs: 4.2 10*3/uL (ref 1.4–7.7)
Neutrophils Relative %: 59.9 % (ref 43.0–77.0)
Platelets: 227 10*3/uL (ref 150.0–400.0)
RBC: 3.66 Mil/uL — ABNORMAL LOW (ref 3.87–5.11)
RDW: 12.4 % (ref 11.5–15.5)
WBC: 7 10*3/uL (ref 4.0–10.5)

## 2018-06-21 LAB — VITAMIN B12: Vitamin B-12: 218 pg/mL (ref 211–911)

## 2018-06-21 LAB — TSH: TSH: 2.17 u[IU]/mL (ref 0.35–4.50)

## 2018-06-22 ENCOUNTER — Encounter: Payer: Self-pay | Admitting: Family Medicine

## 2018-06-27 DIAGNOSIS — Z6825 Body mass index (BMI) 25.0-25.9, adult: Secondary | ICD-10-CM | POA: Diagnosis not present

## 2018-06-27 DIAGNOSIS — I10 Essential (primary) hypertension: Secondary | ICD-10-CM | POA: Diagnosis not present

## 2018-06-27 DIAGNOSIS — M47812 Spondylosis without myelopathy or radiculopathy, cervical region: Secondary | ICD-10-CM | POA: Diagnosis not present

## 2018-07-04 ENCOUNTER — Ambulatory Visit: Payer: 59 | Admitting: Cardiovascular Disease

## 2018-07-23 ENCOUNTER — Other Ambulatory Visit: Payer: Self-pay | Admitting: Family Medicine

## 2018-07-23 MED FILL — ATENOLOL 50 MG TABLET: 50 | 90 days supply | Qty: 90 | Fill #3

## 2018-07-23 MED FILL — PANTOPRAZOLE SOD DR 40 MG T: 40 | 90 days supply | Qty: 90 | Fill #3

## 2018-07-23 MED FILL — VIT D2 1.25 MG (50,000 UNIT: 1.25 MG | 84 days supply | Qty: 12 | Fill #3

## 2018-07-23 MED FILL — HYDROCHLOROTHIAZIDE 25 MG T: 25 | 90 days supply | Qty: 90 | Fill #3

## 2018-07-24 MED FILL — LOSARTAN POTASSIUM 50 MG TA: 50 | 90 days supply | Qty: 90 | Fill #0

## 2018-07-26 DIAGNOSIS — E785 Hyperlipidemia, unspecified: Secondary | ICD-10-CM | POA: Diagnosis not present

## 2018-07-26 MED FILL — LIVALO 2 MG TABLET: 2 | 90 days supply | Qty: 90 | Fill #1

## 2018-07-27 LAB — NMR, LIPOPROFILE
Cholesterol, Total: 251 mg/dL — ABNORMAL HIGH (ref 100–199)
HDL Particle Number: 51.1 umol/L (ref >=30.5)
HDL-C: 69 mg/dL (ref >39)
LDL Particle Number: 1826 nmol/L — ABNORMAL HIGH (ref <1000)
LDL SIZE: 20.9 nm (ref >20.5)
LDL-C: 138 mg/dL — ABNORMAL HIGH (ref 0–99)
LP-IR SCORE: 52 — AB (ref <=45)
SMALL LDL PARTICLE NUMBER: 1137 nmol/L — AB (ref <=527)
Triglycerides: 220 mg/dL — ABNORMAL HIGH (ref 0–149)

## 2018-07-27 LAB — HEPATIC FUNCTION PANEL
ALBUMIN: 4.8 g/dL (ref 3.5–5.5)
ALT: 67 IU/L — ABNORMAL HIGH (ref 0–32)
AST: 138 IU/L — ABNORMAL HIGH (ref 0–40)
Alkaline Phosphatase: 70 IU/L (ref 39–117)
Bilirubin Total: 0.4 mg/dL (ref 0.0–1.2)
Bilirubin, Direct: 0.13 mg/dL (ref 0.00–0.40)
Total Protein: 7.3 g/dL (ref 6.0–8.5)

## 2018-07-31 ENCOUNTER — Ambulatory Visit: Payer: 59 | Admitting: Internal Medicine

## 2018-07-31 ENCOUNTER — Encounter: Payer: Self-pay | Admitting: Internal Medicine

## 2018-07-31 VITALS — BP 144/86 | HR 66 | Ht 69.0 in | Wt 164.8 lb

## 2018-07-31 DIAGNOSIS — E785 Hyperlipidemia, unspecified: Secondary | ICD-10-CM | POA: Diagnosis not present

## 2018-07-31 DIAGNOSIS — Z789 Other specified health status: Secondary | ICD-10-CM | POA: Diagnosis not present

## 2018-07-31 DIAGNOSIS — E782 Mixed hyperlipidemia: Secondary | ICD-10-CM | POA: Diagnosis not present

## 2018-07-31 DIAGNOSIS — K760 Fatty (change of) liver, not elsewhere classified: Secondary | ICD-10-CM

## 2018-07-31 MED ORDER — PITAVASTATIN CALCIUM 1 MG PO TABS: 1.0000 mg | ORAL_TABLET | Freq: Every day | ORAL | 3 refills | Status: DC

## 2018-07-31 MED ORDER — LOSARTAN POTASSIUM 100 MG PO TABS: 100.0000 mg | ORAL_TABLET | Freq: Every day | ORAL | 3 refills | Status: DC

## 2018-07-31 NOTE — Progress Notes (Addendum)
OFFICE NOTE  Chief Complaint:  Follow-up dyslipidemia  Primary Care Physician: Briscoe Deutscher, DO  HPI:  [name] [name] is a 57 y.o. female with a past medial history significant for cerebral aneurysm, hypertension, dyslipidemia and family history of hypertension and dyslipidemia, but no significant early onset heart disease.  She was noted to have dyslipidemia in the past and this was monitored.  When she changed primary care providers she was started on statin therapy which caused a marked reduction in cholesterol.  The time this was atorvastatin 10 mg, however she had significant elevation of liver enzymes.  An abdominal ultrasound in 2018 demonstrated fatty liver disease is the likely cause of this.  After stopping the statin therapy her liver enzymes returned to baseline which was borderline abnormal.  She was seen by Tana Coast, PharmD, for evaluation of her dyslipidemia as a new patient. [name] [name] recommended trying Livalo 1 mg daily.  [name] [name] reports compliance with this medication and had liver enzyme testing 2 months ago which indicated stable liver enzymes.  I suspect she is tolerating this well as the statin is predominantly glucuronidated.  However, it is a low to moderate intensity statin at various doses and may not reduce her cholesterol significantly.  We further discussed her diet today which she felt was very healthy.  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  She reports a very deep knowledge of healthy diet.  There is no documented ASCVD.  05/01/2018  [name] [name] returns today for follow-up.  She has aggressively tried to alter her diet as well as maintain compliance with Livalo 1 mg daily.  We repeated recent lab work which indicates reduction in cholesterol the 271, triglycerides were 154 (reduced from 290), HDL increased from 45-73 and calculated LDL  was 167 (up from 146).  This does represent an increase LDL, however she has had particle shifting.  Liver enzymes have remained stable he mild elevated less than 2 times the upper limit of normal.  She underwent coronary artery calcium scoring, which demonstrated a calcium score of 6.  There was some scattered sub-pulmonary nodules and I would recommend a repeat CT scan without contrast in 1 year.  07/31/2018  [name] [name] is seen today in follow-up.  Overall she seems to be doing well.  I increased her Livalo to 2 mg daily.  This was associated with an increase in liver enzymes which was significant.  Her AST went from 21-67 and ALT from 26-138.  Again, she remains asymptomatic with this, however I have some concern given her history of underlying liver disease whether she will be tolerating this increase.  It is seems that she will not.   PMHx:  Past Medical History:  Diagnosis Date  . Cerebral aneurysm 1999   Surgically coiled  . DDD (degenerative disc disease)   . GERD (gastroesophageal reflux disease)   . History of Barrett's esophagus   . Hypercholesterolemia   . Hypertension   . Irritable bowel disease   . Sinus disease     Past Surgical History:  Procedure Laterality Date  . ABDOMINAL HYSTERECTOMY  2005  . APPENDECTOMY    . ARTHROTOMY Right 12/11/2015   Procedure: OPEN ARTHROTOMY ;  Surgeon: Roseanne Kaufman, MD;  Location: [place];  Service: Orthopedics;  Laterality: Right;  . AUGMENTATION MAMMAPLASTY Bilateral   . BREAST ENHANCEMENT SURGERY    .  CEREBRAL ANEURYSM REPAIR  1999  . DIAGNOSTIC LAPAROSCOPY    . LUMBAR DISC SURGERY Left 06/03/2010   Procedure: ANTEROLATERAL DECOMPRESSION AND ARTHRODESIS L3-4, L4-5  . NASAL SINUS SURGERY  2013  . ORIF ANKLE FRACTURE  03/01/2012   Procedure: OPEN REDUCTION INTERNAL FIXATION (ORIF) ANKLE FRACTURE;  Surgeon: Wylene Simmer, MD;  Location: [place];  Service: Orthopedics;  Laterality: Right;  Open reduction  internal fixation right navicular fracture  . REFRACTIVE SURGERY    . SHOULDER ARTHROSCOPY Left   . SYNOVECTOMY Right 12/11/2015   Procedure: SYNOVECTOMY DISTAL RADIOULNAR JOINT LOOSE BODY REMOVAL ;  Surgeon: Roseanne Kaufman, MD;  Location: [place];  Service: Orthopedics;  Laterality: Right;  . WRIST ARTHROSCOPY Right 12/11/2015   Procedure: RIGHT WRIST ARTHROSCOPY WITH DEBRIDEMENT ;  Surgeon: Roseanne Kaufman, MD;  Location: [place];  Service: Orthopedics;  Laterality: Right;    FAMHx:  Family History  Problem Relation Age of Onset  . Hypertension Father   . Hypertension Mother   . Osteopenia Mother   . Osteopenia Maternal Grandmother     SOCHx:   reports that she quit smoking about 33 years ago. She has never used smokeless tobacco. She reports that she drinks alcohol. She reports that she does not use drugs.  ALLERGIES:  Allergies  Allergen Reactions  . Hydrocodone-Acetaminophen Itching  . Morphine And Related Nausea Only    Nausea and vomitting  . Tetracyclines & Related     Break out on all extremties    ROS: Pertinent items noted in HPI and remainder of comprehensive ROS otherwise negative.  HOME MEDS: Current Outpatient Medications on File Prior to Visit  Medication Sig Dispense Refill  . atenolol (TENORMIN) 50 MG tablet Take 1 tablet (50 mg total) by mouth at bedtime. 90 tablet 3  . Calcium Citrate-Vitamin D 315-250 MG-UNIT TABS Take 1 tablet by mouth 2 (two) times daily.    . cyclobenzaprine (FLEXERIL) 5 MG tablet Take 1 tablet (5 mg total) by mouth 3 (three) times daily as needed for muscle spasms. 20 tablet 0  . diclofenac (VOLTAREN) 75 MG EC tablet Take 75 mg by mouth 2 (two) times daily.    . hydrochlorothiazide (HYDRODIURIL) 25 MG tablet Take 1 tablet (25 mg total) by mouth daily. 90 tablet 3  . LIVALO 2 MG TABS Take 1 tablet by mouth daily.  3  . losartan (COZAAR) 50 MG tablet TAKE 1 TABLET BY MOUTH DAILY. 90 tablet 0  .  Omega-3 Fatty Acids (FISH OIL) 1000 MG CAPS Take 1,400 mg by mouth.     . pantoprazole (PROTONIX) 40 MG tablet Take 1 tablet (40 mg total) by mouth daily. 90 tablet 3  . Pitavastatin Magnesium 2 MG TABS Take 2 mg by mouth daily. 90 tablet 3  . traMADol (ULTRAM) 50 MG tablet Take 1 tablet (50 mg total) by mouth every 6 (six) hours as needed. 20 tablet 0  . valACYclovir (VALTREX) 500 MG tablet Take 500 mg by mouth 2 (two) times daily.    . Vitamin D, Ergocalciferol, (DRISDOL) 50000 units CAPS capsule TAKE 1 CAPSULE (50,000 UNITS TOTAL) BY MOUTH EVERY 7 (SEVEN) DAYS. 12 capsule 3   No current facility-administered medications on file prior to visit.     LABS/IMAGING: No results found for this or any previous visit (from the past 48 hour(s)). No results found.  LIPID PANEL:    Component Value Date/Time   CHOL 271 (H) 04/24/2018 0000   TRIG  154 (H) 04/24/2018 0000   HDL 73 04/24/2018 0000   CHOLHDL 3.7 04/24/2018 0000   CHOLHDL 6 11/21/2017 0913   VLDL 58.0 (H) 11/21/2017 0913   LDLCALC 167 (H) 04/24/2018 0000   LDLDIRECT 146.0 11/21/2017 0913     WEIGHTS: Wt Readings from Last 3 Encounters:  07/31/18 164 lb 12.8 oz (74.8 kg)  06/20/18 167 lb 12.8 oz (76.1 kg)  05/01/18 165 lb (74.8 kg)    VITALS: BP (!) 144/86   Pulse 66   Ht 5' 9"  (1.753 m)   Wt 164 lb 12.8 oz (74.8 kg)   SpO2 97%   BMI 24.34 kg/m   EXAM: General appearance: alert and no distress Lungs: clear to auscultation bilaterally Heart: regular rate and rhythm, S1, S2 normal, no murmur, click, rub or gallop Extremities: extremities normal, atraumatic, no cyanosis or edema Neurologic: Grossly normal  EKG: Deferred  ASSESSMENT: 1. Dyslipidemia -low 10-year risk of 5% 2. CAC of 6 (03/2018) 3. Family history of dyslipidemia and hypertension 4. Hypertension-controlled 5. NAFLD 6. Subcentimeter pulmonary nodules  PLAN: 1.   [name] [name] had elevated liver enzymes on Livalo 2 mg daily.  We will decrease the  dose back to 1 mg and plan to repeat liver enzymes.  She also has noted since switching from an ACE inhibitor to ARB that her blood pressure has not been well controlled.  The dose equivalent to her previous ACE inhibitor dose was 100 mg of losartan rather than 50.  We will therefore increase her dose to 100 mg daily and have her follow blood pressure at home.  I also think that she is unable to reach her cholesterol goals on current medications.  Livalo is the least likely to cause elevated liver enzymes and therefore would not pursue additional statin medications.  She had also previously been on 10 mg of atorvastatin with similar results. At this point she is statin intolerant. I believe she would be a good candidate for Praluent.  We will go ahead and start the prior approval process.  Plan to see her back in 3 to 4 months with lipid profile at that time.  Pixie Casino, MD, [facility], [place] Director of the Advanced Lipid Disorders &  Cardiovascular Risk Reduction Clinic Diplomate of the American Board of Clinical Lipidology Attending Cardiologist  Direct Dial: [phone]  Fax: [phone]  Website:  [email or web address] 07/31/2018, 11:21 AM

## 2018-07-31 NOTE — Patient Instructions (Addendum)
Your physician has recommended you make the following change in your medication:  -- DECREASE livalo to 69m daily  -- new Rx sent to pharmacy  -- you can cut current tablets in half until you run out -- INCREASE losartan to 1079mdaily  -- take 2 of your 507mablets until you run out  -- new Rx for 100m81mnt to pharmacy  -- START praluent every 14 days (once approved with insurance)  -- you will need a repeat liver function test 2 weeks after starting this medication   -- [email or web address] >> copay card [email or web address]  Your physician recommends that you return for lab work in TWO [place])  Your physician recommends that you schedule a follow-up appointment in THREE [place] [address] HiltDebara Pickettth fasting lab work prior to re-check cholesterol)

## 2018-08-01 ENCOUNTER — Ambulatory Visit: Payer: 59 | Admitting: Internal Medicine

## 2018-08-03 ENCOUNTER — Encounter: Payer: Self-pay | Admitting: Internal Medicine

## 2018-08-03 DIAGNOSIS — Z789 Other specified health status: Secondary | ICD-10-CM | POA: Insufficient documentation

## 2018-08-06 ENCOUNTER — Telehealth: Payer: Self-pay | Admitting: Internal Medicine

## 2018-08-06 NOTE — Telephone Encounter (Signed)
Unable to process PA for [place] via [email or web address]  Called [employer] @ [phone] to initiate PA over the phone. Will receive outcome in 24-72 hours  ICD-10:  E78.2 - mixed hyperlipidemia K76.0 - NAFLD Z78.9 - statin-intolerance   Current therapy - max tolerated dose of Livalo 35m QD, tried atorvastatin 138mQD in past

## 2018-08-09 NOTE — Telephone Encounter (Signed)
Per [employer] fax, additional information was needed for prior authorization.   1. Patient will continue livalo 61m daily d/t increased liver enzymes 2. Patient has an LDL greater than/equal to 70 - her most recent LDL was 1367mdL 3. Patient has coronary atherosclerosis per CAC score of 6 - CT scan 4. Patient does not have HeFH confirmed by Si[place]uch Merlr Du[place] of at least 6  This will be faxed to [phone]

## 2018-08-14 MED ORDER — ALIROCUMAB 150 MG/ML ~~LOC~~ SOPN: 1.0000 | PEN_INJECTOR | SUBCUTANEOUS | 11 refills | Status: DC

## 2018-08-14 MED FILL — PRALUENT 150 MG/ML PEN: 150 | 28 days supply | Qty: 2 | Fill #0

## 2018-08-14 NOTE — Addendum Note (Signed)
Addended by: Fidel Levy on: 08/14/2018 10:25 AM   Modules accepted: Orders

## 2018-08-14 NOTE — Telephone Encounter (Signed)
LM for patient that medication has been approved, Rx sent to [facility] outpatient pharmacy and that she should obtain a co-pay card and take with her to pharmacy.

## 2018-08-14 NOTE — Telephone Encounter (Signed)
Received fax notice that patient has been approved for Praluent 140m/mL from 08/13/2018 - 08/13/2019. [place]

## 2018-09-24 ENCOUNTER — Other Ambulatory Visit: Payer: 59

## 2018-09-24 DIAGNOSIS — E785 Hyperlipidemia, unspecified: Secondary | ICD-10-CM | POA: Diagnosis not present

## 2018-09-24 MED FILL — PRALUENT 150 MG/ML PEN: 150 | 28 days supply | Qty: 2 | Fill #1

## 2018-09-25 LAB — HEPATIC FUNCTION PANEL
ALBUMIN: 4.7 g/dL (ref 3.5–5.5)
ALK PHOS: 88 IU/L (ref 39–117)
ALT: 23 IU/L (ref 0–32)
AST: 33 IU/L (ref 0–40)
Bilirubin Total: 0.4 mg/dL (ref 0.0–1.2)
Bilirubin, Direct: 0.15 mg/dL (ref 0.00–0.40)
Total Protein: 7.3 g/dL (ref 6.0–8.5)

## 2018-09-26 MED FILL — LOSARTAN POTASSIUM 100 MG T: 100 | 90 days supply | Qty: 90 | Fill #0

## 2018-10-12 MED FILL — valACYclovir HCL 1 GM TABS: 1 | 30 days supply | Qty: 90 | Fill #0

## 2018-10-15 MED FILL — PRALUENT 150 MG/ML PEN: 150 | 28 days supply | Qty: 2 | Fill #2

## 2018-10-23 ENCOUNTER — Other Ambulatory Visit: Payer: Self-pay | Admitting: Family Medicine

## 2018-10-23 DIAGNOSIS — M9901 Segmental and somatic dysfunction of cervical region: Secondary | ICD-10-CM | POA: Diagnosis not present

## 2018-10-23 DIAGNOSIS — M9902 Segmental and somatic dysfunction of thoracic region: Secondary | ICD-10-CM | POA: Diagnosis not present

## 2018-10-23 DIAGNOSIS — M530 Cervicocranial syndrome: Secondary | ICD-10-CM | POA: Diagnosis not present

## 2018-10-23 DIAGNOSIS — M9903 Segmental and somatic dysfunction of lumbar region: Secondary | ICD-10-CM | POA: Diagnosis not present

## 2018-10-23 NOTE — Telephone Encounter (Signed)
Refill denied. Pt needs an appt.

## 2018-10-24 DIAGNOSIS — M9901 Segmental and somatic dysfunction of cervical region: Secondary | ICD-10-CM | POA: Diagnosis not present

## 2018-10-24 DIAGNOSIS — M9903 Segmental and somatic dysfunction of lumbar region: Secondary | ICD-10-CM | POA: Diagnosis not present

## 2018-10-24 DIAGNOSIS — M9902 Segmental and somatic dysfunction of thoracic region: Secondary | ICD-10-CM | POA: Diagnosis not present

## 2018-10-24 DIAGNOSIS — M530 Cervicocranial syndrome: Secondary | ICD-10-CM | POA: Diagnosis not present

## 2018-10-26 ENCOUNTER — Other Ambulatory Visit: Payer: Self-pay | Admitting: Family Medicine

## 2018-10-29 MED FILL — ATENOLOL 50 MG TABLET: 50 | 90 days supply | Qty: 90 | Fill #0

## 2018-10-30 DIAGNOSIS — M530 Cervicocranial syndrome: Secondary | ICD-10-CM | POA: Diagnosis not present

## 2018-10-30 DIAGNOSIS — M9903 Segmental and somatic dysfunction of lumbar region: Secondary | ICD-10-CM | POA: Diagnosis not present

## 2018-10-30 DIAGNOSIS — M9901 Segmental and somatic dysfunction of cervical region: Secondary | ICD-10-CM | POA: Diagnosis not present

## 2018-10-30 DIAGNOSIS — M9902 Segmental and somatic dysfunction of thoracic region: Secondary | ICD-10-CM | POA: Diagnosis not present

## 2018-10-31 DIAGNOSIS — M530 Cervicocranial syndrome: Secondary | ICD-10-CM | POA: Diagnosis not present

## 2018-10-31 DIAGNOSIS — M9903 Segmental and somatic dysfunction of lumbar region: Secondary | ICD-10-CM | POA: Diagnosis not present

## 2018-10-31 DIAGNOSIS — M9901 Segmental and somatic dysfunction of cervical region: Secondary | ICD-10-CM | POA: Diagnosis not present

## 2018-10-31 DIAGNOSIS — M9902 Segmental and somatic dysfunction of thoracic region: Secondary | ICD-10-CM | POA: Diagnosis not present

## 2018-11-12 DIAGNOSIS — M9902 Segmental and somatic dysfunction of thoracic region: Secondary | ICD-10-CM | POA: Diagnosis not present

## 2018-11-12 DIAGNOSIS — M9903 Segmental and somatic dysfunction of lumbar region: Secondary | ICD-10-CM | POA: Diagnosis not present

## 2018-11-12 DIAGNOSIS — M530 Cervicocranial syndrome: Secondary | ICD-10-CM | POA: Diagnosis not present

## 2018-11-12 DIAGNOSIS — M9901 Segmental and somatic dysfunction of cervical region: Secondary | ICD-10-CM | POA: Diagnosis not present

## 2018-11-19 DIAGNOSIS — M9901 Segmental and somatic dysfunction of cervical region: Secondary | ICD-10-CM | POA: Diagnosis not present

## 2018-11-19 DIAGNOSIS — M530 Cervicocranial syndrome: Secondary | ICD-10-CM | POA: Diagnosis not present

## 2018-11-19 DIAGNOSIS — M9902 Segmental and somatic dysfunction of thoracic region: Secondary | ICD-10-CM | POA: Diagnosis not present

## 2018-11-19 DIAGNOSIS — M9903 Segmental and somatic dysfunction of lumbar region: Secondary | ICD-10-CM | POA: Diagnosis not present

## 2018-11-19 MED FILL — PRALUENT 150 MG/ML PEN: 150 | 28 days supply | Qty: 2 | Fill #3

## 2018-11-22 DIAGNOSIS — Z01419 Encounter for gynecological examination (general) (routine) without abnormal findings: Secondary | ICD-10-CM | POA: Diagnosis not present

## 2018-11-22 DIAGNOSIS — B009 Herpesviral infection, unspecified: Secondary | ICD-10-CM | POA: Diagnosis not present

## 2018-11-22 MED FILL — valACYclovir HCL 1 GM TABS: 1 | 90 days supply | Qty: 90 | Fill #0

## 2018-11-26 DIAGNOSIS — M9902 Segmental and somatic dysfunction of thoracic region: Secondary | ICD-10-CM | POA: Diagnosis not present

## 2018-11-26 DIAGNOSIS — M9901 Segmental and somatic dysfunction of cervical region: Secondary | ICD-10-CM | POA: Diagnosis not present

## 2018-11-26 DIAGNOSIS — M530 Cervicocranial syndrome: Secondary | ICD-10-CM | POA: Diagnosis not present

## 2018-11-26 DIAGNOSIS — M9903 Segmental and somatic dysfunction of lumbar region: Secondary | ICD-10-CM | POA: Diagnosis not present

## 2018-12-04 ENCOUNTER — Encounter: Payer: Self-pay | Admitting: Physician Assistant

## 2018-12-04 ENCOUNTER — Ambulatory Visit: Payer: 59 | Admitting: Physician Assistant

## 2018-12-04 VITALS — BP 140/90 | HR 98 | Temp 98.6°F | Ht 69.0 in | Wt 168.0 lb

## 2018-12-04 DIAGNOSIS — J0110 Acute frontal sinusitis, unspecified: Secondary | ICD-10-CM

## 2018-12-04 MED ORDER — AMOXICILLIN-POT CLAVULANATE 875-125 MG PO TABS: 1.0000 | ORAL_TABLET | Freq: Two times a day (BID) | ORAL | 0 refills | Status: DC

## 2018-12-04 MED FILL — AMOX-CLAV 875-125 MG TABLET: 875-125 | 10 days supply | Qty: 20 | Fill #0

## 2018-12-04 NOTE — Progress Notes (Signed)
[name] [name] is a 58 y.o. female here for a new problem.  I acted as a Education administrator for [employer], PA-C Anselmo Pickler, LPN  History of Present Illness:   Chief Complaint  Patient presents with  . Cough    Cough  This is a new problem. Episode onset: Started with sore throat and now has had a cough for 6 days. The problem has been gradually worsening. The problem occurs every few hours. The cough is non-productive. Associated symptoms include headaches, nasal congestion (green/yellow bloody drainage), postnasal drip, a sore throat and shortness of breath (past 24 hours). Pertinent negatives include no chills, ear congestion, ear pain or fever. Associated symptoms comments: Sinus pressure.. The symptoms are aggravated by lying down. Risk factors for lung disease include occupational exposure. Treatments tried: Mucinex. The treatment provided mild relief. Her past medical history is significant for pneumonia. There is no history of asthma or bronchitis.    Past Medical History:  Diagnosis Date  . Cerebral aneurysm 1999   Surgically coiled  . DDD (degenerative disc disease)   . GERD (gastroesophageal reflux disease)   . History of Barrett's esophagus   . Hypercholesterolemia   . Hypertension   . Irritable bowel disease   . Sinus disease      Social History   Socioeconomic History  . Marital status: Divorced    Spouse name: Not on file  . Number of children: Not on file  . Years of education: Not on file  . Highest education level: Not on file  Occupational History    Employer: [employer]  . Financial resource strain: Not on file  . Food insecurity:    Worry: Not on file    Inability: Not on file  . Transportation needs:    Medical: Not on file    Non-medical: Not on file  Tobacco Use  . Smoking status: Former Smoker    Last attempt to quit: 11/14/1984    Years since quitting: 34.0  . Smokeless tobacco: Never Used  Substance and Sexual Activity  .  Alcohol use: Yes    Comment: social  . Drug use: No  . Sexual activity: Not on file  Lifestyle  . Physical activity:    Days per week: Not on file    Minutes per session: Not on file  . Stress: Not on file  Relationships  . Social connections:    Talks on phone: Not on file    Gets together: Not on file    Attends religious service: Not on file    Active member of club or organization: Not on file    Attends meetings of clubs or organizations: Not on file    Relationship status: Not on file  . Intimate partner violence:    Fear of current or ex partner: Not on file    Emotionally abused: Not on file    Physically abused: Not on file    Forced sexual activity: Not on file  Other Topics Concern  . Not on file  Social History Narrative   The patient works as a Marine scientist in the electrophysiology department. Her daughter and 4-monthold granddaughter live with her. She does not smoke cigarettes.    Past Surgical History:  Procedure Laterality Date  . ABDOMINAL HYSTERECTOMY  2005  . APPENDECTOMY    . ARTHROTOMY Right 12/11/2015   Procedure: OPEN ARTHROTOMY ;  Surgeon: WRoseanne Kaufman MD;  Location: [facility]  Service: Orthopedics;  Laterality:  Right;  . AUGMENTATION MAMMAPLASTY Bilateral   . BREAST ENHANCEMENT SURGERY    . CEREBRAL ANEURYSM REPAIR  1999  . DIAGNOSTIC LAPAROSCOPY    . LUMBAR DISC SURGERY Left 06/03/2010   Procedure: ANTEROLATERAL DECOMPRESSION AND ARTHRODESIS L3-4, L4-5  . NASAL SINUS SURGERY  2013  . ORIF ANKLE FRACTURE  03/01/2012   Procedure: OPEN REDUCTION INTERNAL FIXATION (ORIF) ANKLE FRACTURE;  Surgeon: Wylene Simmer, MD;  Location: [facility];  Service: Orthopedics;  Laterality: Right;  Open reduction internal fixation right navicular fracture  . REFRACTIVE SURGERY    . SHOULDER ARTHROSCOPY Left   . SYNOVECTOMY Right 12/11/2015   Procedure: SYNOVECTOMY DISTAL RADIOULNAR JOINT LOOSE BODY REMOVAL ;  Surgeon: Roseanne Kaufman, MD;   Location: [facility];  Service: Orthopedics;  Laterality: Right;  . WRIST ARTHROSCOPY Right 12/11/2015   Procedure: RIGHT WRIST ARTHROSCOPY WITH DEBRIDEMENT ;  Surgeon: Roseanne Kaufman, MD;  Location: [facility];  Service: Orthopedics;  Laterality: Right;    Family History  Problem Relation Age of Onset  . Hypertension Father   . Hypertension Mother   . Osteopenia Mother   . Osteopenia Maternal Grandmother     Allergies  Allergen Reactions  . Hydrocodone-Acetaminophen Itching  . Lipitor [Atorvastatin Calcium] Other (See Comments)    Elevated liver enzymes  . Morphine And Related Nausea Only    Nausea and vomitting  . Tetracyclines & Related     Break out on all extremties    Current Medications:   Current Outpatient Medications:  .  Alirocumab (PRALUENT) 150 MG/ML SOPN, Inject 1 Dose into the skin every 14 (fourteen) days., Disp: 2 pen, Rfl: 11 .  atenolol (TENORMIN) 50 MG tablet, TAKE 1 TABLET (50 MG TOTAL) BY MOUTH AT BEDTIME., Disp: 90 tablet, Rfl: 1 .  Calcium Citrate-Vitamin D 315-250 MG-UNIT TABS, Take 1 tablet by mouth 2 (two) times daily., Disp: , Rfl:  .  cyclobenzaprine (FLEXERIL) 5 MG tablet, Take 1 tablet (5 mg total) by mouth 3 (three) times daily as needed for muscle spasms., Disp: 20 tablet, Rfl: 0 .  diclofenac (VOLTAREN) 75 MG EC tablet, Take 75 mg by mouth 2 (two) times daily., Disp: , Rfl:  .  hydrochlorothiazide (HYDRODIURIL) 25 MG tablet, Take 1 tablet (25 mg total) by mouth daily., Disp: 90 tablet, Rfl: 3 .  losartan (COZAAR) 100 MG tablet, Take 1 tablet (100 mg total) by mouth daily., Disp: 90 tablet, Rfl: 3 .  Omega-3 Fatty Acids (FISH OIL) 1000 MG CAPS, Take 1,400 mg by mouth. , Disp: , Rfl:  .  pantoprazole (PROTONIX) 40 MG tablet, Take 1 tablet (40 mg total) by mouth daily., Disp: 90 tablet, Rfl: 3 .  Pitavastatin Calcium (LIVALO) 1 MG TABS, Take 1 tablet (1 mg total) by mouth daily., Disp: 90 tablet, Rfl: 3 .  valACYclovir  (VALTREX) 500 MG tablet, Take 500 mg by mouth 2 (two) times daily., Disp: , Rfl:  .  amoxicillin-clavulanate (AUGMENTIN) 875-125 MG tablet, Take 1 tablet by mouth 2 (two) times daily., Disp: 20 tablet, Rfl: 0 .  MULTIPLE VITAMINS-MINERALS PO, Take by mouth., Disp: , Rfl:    Review of Systems:   Review of Systems  Constitutional: Negative for chills and fever.  HENT: Positive for postnasal drip and sore throat. Negative for ear pain.   Respiratory: Positive for cough and shortness of breath (past 24 hours).   Neurological: Positive for headaches.    Vitals:   Vitals:   12/04/18 1426  BP: 140/90  Pulse: 98  Temp: 98.6 F (37 C)  TempSrc: Oral  SpO2: 97%  Weight: 168 lb (76.2 kg)  Height: 5' 9"  (1.753 m)     Body mass index is 24.81 kg/m.  Physical Exam:   Physical Exam Vitals signs and nursing note reviewed.  Constitutional:      General: She is not in acute distress.    Appearance: She is well-developed. She is not ill-appearing or toxic-appearing.  HENT:     Head: Normocephalic and atraumatic.     Right Ear: Tympanic membrane, ear canal and external ear normal. Tympanic membrane is not erythematous, retracted or bulging.     Left Ear: Tympanic membrane, ear canal and external ear normal. Tympanic membrane is not erythematous, retracted or bulging.     Nose:     Right Sinus: Maxillary sinus tenderness present. No frontal sinus tenderness.     Left Sinus: Maxillary sinus tenderness present. No frontal sinus tenderness.     Mouth/Throat:     Pharynx: Uvula midline. Posterior oropharyngeal erythema present.     Tonsils: Swelling: 0 on the right. 0 on the left.  Eyes:     General: Lids are normal.     Conjunctiva/sclera: Conjunctivae normal.  Neck:     Trachea: Trachea normal.  Cardiovascular:     Rate and Rhythm: Normal rate and regular rhythm.     Heart sounds: Normal heart sounds, S1 normal and S2 normal.  Pulmonary:     Effort: Pulmonary effort is normal.      Breath sounds: Normal breath sounds. No decreased breath sounds, wheezing, rhonchi or rales.  Lymphadenopathy:     Cervical: No cervical adenopathy.  Skin:    General: Skin is warm and dry.  Neurological:     Mental Status: She is alert.  Psychiatric:        Speech: Speech normal.        Behavior: Behavior normal. Behavior is cooperative.       Assessment and Plan:   M[place]ha was seen today for cough.  Diagnoses and all orders for this visit:  Acute non-recurrent frontal sinusitis  Other orders -     amoxicillin-clavulanate (AUGMENTIN) 875-125 MG tablet; Take 1 tablet by mouth 2 (two) times daily.   No red flags on exam.  Will initiate Augmentin per orders. Discussed taking medications as prescribed. Reviewed return precautions including worsening fever, SOB, worsening cough or other concerns. Push fluids and rest. I recommend that patient follow-up if symptoms worsen or persist despite treatment x 7-10 days, sooner if needed.  Reviewed expectations re: course of current medical issues. Discussed self-management of symptoms. Outlined signs and symptoms indicating need for more acute intervention. Patient verbalized understanding and all questions were answered. See orders for this visit as documented in the electronic medical record. Patient received an After-Visit Summary.  CMA or LPN served as scribe during this visit. History, Physical, and Plan performed by medical provider. The above documentation has been reviewed and is accurate and complete.   Inda Coke, PA-C

## 2018-12-04 NOTE — Patient Instructions (Signed)
It was great to see you!  Use medication as prescribed: Augmentin  Push fluids and get plenty of rest. Please return if you are not improving as expected, or if you have high fevers (>101.5) or difficulty swallowing or worsening productive cough.  Call clinic with questions.  I hope you start feeling better soon!

## 2018-12-10 ENCOUNTER — Other Ambulatory Visit: Payer: Self-pay | Admitting: Family Medicine

## 2018-12-10 MED FILL — LOSARTAN POTASSIUM 100 MG T: 100 | 90 days supply | Qty: 90 | Fill #1

## 2018-12-10 MED FILL — LIVALO 2 MG TABLET: 2 | 90 days supply | Qty: 90 | Fill #2

## 2018-12-10 MED FILL — PANTOPRAZOLE SOD DR 40 MG T: 40 | 90 days supply | Qty: 90 | Fill #0

## 2018-12-10 MED FILL — HYDROCHLOROTHIAZIDE 25 MG T: 25 | 90 days supply | Qty: 90 | Fill #0

## 2018-12-10 NOTE — Progress Notes (Signed)
[name] [address], [place] [address] Phone: [phone] Subjective:   [name], am serving as a scribe for Dr. Hulan [name].  I'm seeing this patient by the request  of:  [name] Deutscher, DO   CC: Left hip pain  [id number]  [name] [name] is a 58 y.o. female coming in with complaint of left hip pain that started the beginning of December 2019. One week prior to the start of her hip pain she had an increase in her back pain due to an increase in her workload. Did see a chiropractor for pelvic rotation. Pain in lumbar spine decreased but left hip pain continued. Pain was over ischial tuberosity, then groin but it has moved to ASIS now and predominately sits there. Has tried stretching. Notes that FABER test causes pain of anterior hip. Has tried diclofenac and would like a new prescription.       Past Medical History:  Diagnosis Date  . Cerebral aneurysm 1999   Surgically coiled  . DDD (degenerative disc disease)   . GERD (gastroesophageal reflux disease)   . History of Barrett's esophagus   . Hypercholesterolemia   . Hypertension   . Irritable bowel disease   . Sinus disease    Past Surgical History:  Procedure Laterality Date  . ABDOMINAL HYSTERECTOMY  2005  . APPENDECTOMY    . ARTHROTOMY Right 12/11/2015   Procedure: OPEN ARTHROTOMY ;  Surgeon: Roseanne Kaufman, MD;  Location: [facility];  Service: Orthopedics;  Laterality: Right;  . AUGMENTATION MAMMAPLASTY Bilateral   . BREAST ENHANCEMENT SURGERY    . CEREBRAL ANEURYSM REPAIR  1999  . DIAGNOSTIC LAPAROSCOPY    . LUMBAR DISC SURGERY Left 06/03/2010   Procedure: ANTEROLATERAL DECOMPRESSION AND ARTHRODESIS L3-4, L4-5  . NASAL SINUS SURGERY  2013  . ORIF ANKLE FRACTURE  03/01/2012   Procedure: OPEN REDUCTION INTERNAL FIXATION (ORIF) ANKLE FRACTURE;  Surgeon: Wylene Simmer, MD;  Location: [facility];  Service: Orthopedics;  Laterality: Right;  Open  reduction internal fixation right navicular fracture  . REFRACTIVE SURGERY    . SHOULDER ARTHROSCOPY Left   . SYNOVECTOMY Right 12/11/2015   Procedure: SYNOVECTOMY DISTAL RADIOULNAR JOINT LOOSE BODY REMOVAL ;  Surgeon: Roseanne Kaufman, MD;  Location: [facility];  Service: Orthopedics;  Laterality: Right;  . WRIST ARTHROSCOPY Right 12/11/2015   Procedure: RIGHT WRIST ARTHROSCOPY WITH DEBRIDEMENT ;  Surgeon: Roseanne Kaufman, MD;  Location: [facility];  Service: Orthopedics;  Laterality: Right;   Social History   Socioeconomic History  . Marital status: Divorced    Spouse name: Not on file  . Number of children: Not on file  . Years of education: Not on file  . Highest education level: Not on file  Occupational History    Employer: [employer]  . Financial resource strain: Not on file  . Food insecurity:    Worry: Not on file    Inability: Not on file  . Transportation needs:    Medical: Not on file    Non-medical: Not on file  Tobacco Use  . Smoking status: Former Smoker    Last attempt to quit: 11/14/1984    Years since quitting: 34.0  . Smokeless tobacco: Never Used  Substance and Sexual Activity  . Alcohol use: Yes    Comment: social  . Drug use: No  . Sexual activity: Not on file  Lifestyle  . Physical activity:  Days per week: Not on file    Minutes per session: Not on file  . Stress: Not on file  Relationships  . Social connections:    Talks on phone: Not on file    Gets together: Not on file    Attends religious service: Not on file    Active member of club or organization: Not on file    Attends meetings of clubs or organizations: Not on file    Relationship status: Not on file  Other Topics Concern  . Not on file  Social History Narrative   The patient works as a Marine scientist in the electrophysiology department. Her daughter and 66-monthold granddaughter live with her. She does not smoke cigarettes.   Allergies  Allergen  Reactions  . Hydrocodone-Acetaminophen Itching  . Lipitor [Atorvastatin Calcium] Other (See Comments)    Elevated liver enzymes  . Morphine And Related Nausea Only    Nausea and vomitting  . Tetracyclines & Related     Break out on all extremties   Family History  Problem Relation Age of Onset  . Hypertension Father   . Hypertension Mother   . Osteopenia Mother   . Osteopenia Maternal Grandmother      Current Outpatient Medications (Cardiovascular):  .Marland Kitchen Alirocumab (PRALUENT) 150 MG/ML SOPN, Inject 1 Dose into the skin every 14 (fourteen) days. .Marland Kitchen atenolol (TENORMIN) 50 MG tablet, TAKE 1 TABLET (50 MG TOTAL) BY MOUTH AT BEDTIME. .  hydrochlorothiazide (HYDRODIURIL) 25 MG tablet, TAKE 1 TABLET (25 MG TOTAL) BY MOUTH DAILY. .Marland Kitchen losartan (COZAAR) 100 MG tablet, Take 1 tablet (100 mg total) by mouth daily. .  Pitavastatin Calcium (LIVALO) 1 MG TABS, Take 1 tablet (1 mg total) by mouth daily.   Current Outpatient Medications (Analgesics):  .  diclofenac (VOLTAREN) 75 MG EC tablet, Take 75 mg by mouth 2 (two) times daily. .  meloxicam (MOBIC) 15 MG tablet, Take 1 tablet (15 mg total) by mouth daily.   Current Outpatient Medications (Other):  .  amoxicillin-clavulanate (AUGMENTIN) 875-125 MG tablet, Take 1 tablet by mouth 2 (two) times daily. .  Calcium Citrate-Vitamin D 315-250 MG-UNIT TABS, Take 1 tablet by mouth 2 (two) times daily. .  MULTIPLE VITAMINS-MINERALS PO, Take by mouth. .  Omega-3 Fatty Acids (FISH OIL) 1000 MG CAPS, Take 1,400 mg by mouth.  .  pantoprazole (PROTONIX) 40 MG tablet, TAKE 1 TABLET BY MOUTH DAILY. .  valACYclovir (VALTREX) 500 MG tablet, Take 500 mg by mouth 2 (two) times daily. .  traZODone (DESYREL) 50 MG tablet, Take 0.5-1 tablets (25-50 mg total) by mouth at bedtime as needed for sleep.    Past medical history, social, surgical and family history all reviewed in electronic medical record.  No pertanent information unless stated regarding to the chief  complaint.   Review of Systems:  No headache, visual changes, nausea, vomiting, diarrhea, constipation, dizziness, abdominal pain, skin rash, fevers, chills, night sweats, weight loss, swollen lymph nodes, body aches, joint swelling, , chest pain, shortness of breath, mood changes.  Positive muscle aches  Objective  Blood pressure 114/88, pulse 74, height 5' 9"  (1.753 m), weight 169 lb (76.7 kg), SpO2 97 %.    General: No apparent distress alert and oriented x3 mood and affect normal, dressed appropriately.  HEENT: Pupils equal, extraocular movements intact  Respiratory: Patient's speak in full sentences and does not appear short of breath  Cardiovascular: No lower extremity edema, non tender, no erythema  Skin: Warm dry intact with  no signs of infection or rash on extremities or on axial skeleton.  Abdomen: Soft nontender  Neuro: Cranial nerves II through XII are intact, neurovascularly intact in all extremities with 2+ DTRs and 2+ pulses.  Lymph: No lymphadenopathy of posterior or anterior cervical chain or axillae bilaterally.  Gait mild antalgic MSK:  Non tender with full range of motion and good stability and symmetric strength and tone of shoulders, elbows, wrist, knee and ankles bilaterally.  Hip: Left ROM IR: Minimal internal range of motion with severe pain in the groin area.  Patient does have a negative straight leg test but does have tightness of the hamstring.  Patient's Corky Sox test does show discomfort more in the anterior and lateral aspect of the hip.  Mild pain over the left sacroiliac joint as well.   Impression and Recommendations:     This case required medical decision making of moderate complexity. The above documentation has been reviewed and is accurate and complete Lyndal Pulley, DO       Note: This dictation was prepared with Dragon dictation along with smaller phrase technology. Any transcriptional errors that result from this process are unintentional.

## 2018-12-11 ENCOUNTER — Ambulatory Visit (INDEPENDENT_AMBULATORY_CARE_PROVIDER_SITE_OTHER)
Admission: RE | Admit: 2018-12-11 | Discharge: 2018-12-11 | Disposition: A | Discharge status: Home / Self Care | Payer: 59 | Source: Ambulatory Visit | Attending: Family Medicine | Admitting: Family Medicine

## 2018-12-11 ENCOUNTER — Encounter: Payer: Self-pay | Admitting: Family Medicine

## 2018-12-11 ENCOUNTER — Telehealth: Payer: Self-pay | Admitting: Family Medicine

## 2018-12-11 ENCOUNTER — Ambulatory Visit: Payer: 59 | Admitting: Family Medicine

## 2018-12-11 VITALS — BP 114/88 | HR 74 | Ht 69.0 in | Wt 169.0 lb

## 2018-12-11 DIAGNOSIS — E785 Hyperlipidemia, unspecified: Secondary | ICD-10-CM | POA: Diagnosis not present

## 2018-12-11 DIAGNOSIS — M25552 Pain in left hip: Secondary | ICD-10-CM | POA: Diagnosis not present

## 2018-12-11 LAB — LIPID PANEL
CHOL/HDL RATIO: 2.3 ratio (ref 0.0–4.4)
Cholesterol, Total: 132 mg/dL (ref 100–199)
HDL: 57 mg/dL (ref >39)
LDL Calculated: 40 mg/dL (ref 0–99)
Triglycerides: 173 mg/dL — ABNORMAL HIGH (ref 0–149)
VLDL Cholesterol Cal: 35 mg/dL (ref 5–40)

## 2018-12-11 MED ORDER — MELOXICAM 15 MG PO TABS: 15.0000 mg | ORAL_TABLET | Freq: Every day | ORAL | 0 refills | Status: DC

## 2018-12-11 MED ORDER — TRAZODONE HCL 50 MG PO TABS: 25.0000 mg | ORAL_TABLET | Freq: Every evening | ORAL | 3 refills | Status: DC | PRN

## 2018-12-11 MED FILL — traZODone HCL 50 MG TABS: 50 | 30 days supply | Qty: 30 | Fill #0

## 2018-12-11 MED FILL — MELOXICAM 15 MG TABLET: 15 | 30 days supply | Qty: 30 | Fill #0

## 2018-12-11 MED FILL — PRALUENT 150 MG/ML PEN: 150 | 28 days supply | Qty: 2 | Fill #4

## 2018-12-11 NOTE — Assessment & Plan Note (Signed)
Left hip pain.  Discussed with patient in great length.  We discussed icing regimen and home exercise.  Discussed which activities to do which wants to avoid.  I am concerned for possible osteoarthritic changes.  X-rays are pending.  He is severe arthritic changes within the amount of pain the patient has had a possible surgical intervention will be necessary.  Patient is in agreement with the plan.  Trazodone for nighttime pain, meloxicam for daytime pain.  Follow-up for me in 4 weeks otherwise.

## 2018-12-11 NOTE — Patient Instructions (Addendum)
Great to see you  Sorry for the bad news Xray downstairs Ice 20 minutes 2 times daily. Usually after activity and before bed. Meloxicam daily for 10 days then as needed instead of diclofenac  Trazadone at night to help with sleep  Exercises 3 times a week.  Tart cherry extract any dose at night may help with pain and sleep  Have an appointment in 4 weeks just in case

## 2018-12-11 NOTE — Telephone Encounter (Signed)
Copied from [place] [id number]. Topic: General - Other >> Dec 11, 2018  2:25 PM Oneta Rack wrote:  Relation to pt: self  Call back number: Pharmacy: [place], [place] - [address]. [phone] (Phone) [phone] (Fax)    Reason for call:  Patient states she was seen today by Dr. Tamala Julian [facility] location) and re prescribing Vitamin D, Ergocalciferol, (DRISDOL) 50000 units CAPS capsule was discussed but never sent over to the pharmacy, patient checking on the status, please advise

## 2018-12-12 ENCOUNTER — Other Ambulatory Visit: Payer: Self-pay

## 2018-12-12 DIAGNOSIS — M9902 Segmental and somatic dysfunction of thoracic region: Secondary | ICD-10-CM | POA: Diagnosis not present

## 2018-12-12 DIAGNOSIS — M9901 Segmental and somatic dysfunction of cervical region: Secondary | ICD-10-CM | POA: Diagnosis not present

## 2018-12-12 DIAGNOSIS — M9903 Segmental and somatic dysfunction of lumbar region: Secondary | ICD-10-CM | POA: Diagnosis not present

## 2018-12-12 DIAGNOSIS — M530 Cervicocranial syndrome: Secondary | ICD-10-CM | POA: Diagnosis not present

## 2018-12-12 MED ORDER — VITAMIN D (ERGOCALCIFEROL) 1.25 MG (50000 UNIT) PO CAPS: 50000.0000 [IU] | ORAL_CAPSULE | ORAL | 0 refills | Status: DC

## 2018-12-12 MED FILL — VIT D2 1.25 MG (50,000 UNIT: 1.25 MG | 84 days supply | Qty: 12 | Fill #0

## 2018-12-12 NOTE — Telephone Encounter (Signed)
Rx sent in per a verbal from Dr. Tamala Julian.

## 2018-12-19 ENCOUNTER — Encounter: Payer: Self-pay | Admitting: Internal Medicine

## 2018-12-19 ENCOUNTER — Ambulatory Visit: Payer: 59 | Admitting: Internal Medicine

## 2018-12-19 VITALS — BP 126/82 | HR 64 | Ht 69.0 in | Wt 172.0 lb

## 2018-12-19 DIAGNOSIS — Z789 Other specified health status: Secondary | ICD-10-CM | POA: Diagnosis not present

## 2018-12-19 DIAGNOSIS — K760 Fatty (change of) liver, not elsewhere classified: Secondary | ICD-10-CM

## 2018-12-19 DIAGNOSIS — E782 Mixed hyperlipidemia: Secondary | ICD-10-CM | POA: Diagnosis not present

## 2018-12-19 DIAGNOSIS — Z8249 Family history of ischemic heart disease and other diseases of the circulatory system: Secondary | ICD-10-CM | POA: Diagnosis not present

## 2018-12-19 MED ORDER — PITAVASTATIN CALCIUM 1 MG PO TABS: 1.0000 mg | ORAL_TABLET | Freq: Every day | ORAL | 3 refills | Status: DC

## 2018-12-19 MED FILL — LIVALO 1 MG TABLET: 1 | 90 days supply | Qty: 90 | Fill #0 | Status: TO

## 2018-12-19 NOTE — Patient Instructions (Signed)
Your physician recommends that you continue on your current medications as directed. Please refer to the Current Medication list given to you today.     Dr. Debara Pickett recommends that you schedule a follow up visit with him the in the [facility] in 12 months. Please have fasting blood work about 1 week prior to this visit and he will review the blood work results with you at your appointment.

## 2018-12-19 NOTE — Progress Notes (Signed)
OFFICE NOTE  Chief Complaint:  Follow-up dyslipidemia  Primary Care Physician: Briscoe Deutscher, DO  HPI:  [name] [name] is a 58 y.o. female with a past medial history significant for cerebral aneurysm, hypertension, dyslipidemia and family history of hypertension and dyslipidemia, but no significant early onset heart disease.  She was noted to have dyslipidemia in the past and this was monitored.  When she changed primary care providers she was started on statin therapy which caused a marked reduction in cholesterol.  The time this was atorvastatin 10 mg, however she had significant elevation of liver enzymes.  An abdominal ultrasound in 2018 demonstrated fatty liver disease is the likely cause of this.  After stopping the statin therapy her liver enzymes returned to baseline which was borderline abnormal.  She was seen by [name] [name], PharmD, for evaluation of her dyslipidemia as a new patient. [name] [name] recommended trying Livalo 1 mg daily.  [name] [name] reports compliance with this medication and had liver enzyme testing 2 months ago which indicated stable liver enzymes.  I suspect she is tolerating this well as the statin is predominantly glucuronidated.  However, it is a low to moderate intensity statin at various doses and may not reduce her cholesterol significantly.  We further discussed her diet today which she felt was very healthy.  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  She reports a very deep knowledge of healthy diet.  There is no documented ASCVD.  05/01/2018  [name] [name] returns today for follow-up.  She has aggressively tried to alter her diet as well as maintain compliance with Livalo 1 mg daily.  We repeated recent lab work which indicates reduction in cholesterol the 271, triglycerides were 154 (reduced from 290), HDL increased from 45-73 and calculated LDL  was 167 (up from 146).  This does represent an increase LDL, however she has had particle shifting.  Liver enzymes have remained stable he mild elevated less than 2 times the upper limit of normal.  She underwent coronary artery calcium scoring, which demonstrated a calcium score of 6.  There was some scattered sub-pulmonary nodules and I would recommend a repeat CT scan without contrast in 1 year.  07/31/2018  [name] [name] is seen today in follow-up.  Overall she seems to be doing well.  I increased her Livalo to 2 mg daily.  This was associated with an increase in liver enzymes which was significant.  Her AST went from 21-67 and ALT from 26-138.  Again, she remains asymptomatic with this, however I have some concern given her history of underlying liver disease whether she will be tolerating this increase.  It is seems that she will not.   12/19/2018  [name] [name] returns today for follow-up.  Overall she has been doing well and tolerating Praluent.  We had to reduce the dose of her Livalo due to elevated liver enzymes.  Her most recent liver enzymes however have normalized with AST of 33 and ALT of 23.  Her cholesterol has also dropped significantly on Praluent.  Her total cholesterol is 132, triglycerides 173 (decreased from 220), HDL 57 and LDL 40 (decreased from 167).  She is tolerating the medication without any myalgias or other side effects.  PMHx:  Past Medical History:  Diagnosis Date  . Cerebral aneurysm 1999   Surgically coiled  . DDD (degenerative disc disease)   .  GERD (gastroesophageal reflux disease)   . History of Barrett's esophagus   . Hypercholesterolemia   . Hypertension   . Irritable bowel disease   . Sinus disease     Past Surgical History:  Procedure Laterality Date  . ABDOMINAL HYSTERECTOMY  2005  . APPENDECTOMY    . ARTHROTOMY Right 12/11/2015   Procedure: OPEN ARTHROTOMY ;  Surgeon: Roseanne Kaufman, MD;  Location: [facility];  Service: Orthopedics;   Laterality: Right;  . AUGMENTATION MAMMAPLASTY Bilateral   . BREAST ENHANCEMENT SURGERY    . CEREBRAL ANEURYSM REPAIR  1999  . DIAGNOSTIC LAPAROSCOPY    . LUMBAR DISC SURGERY Left 06/03/2010   Procedure: ANTEROLATERAL DECOMPRESSION AND ARTHRODESIS L3-4, L4-5  . NASAL SINUS SURGERY  2013  . ORIF ANKLE FRACTURE  03/01/2012   Procedure: OPEN REDUCTION INTERNAL FIXATION (ORIF) ANKLE FRACTURE;  Surgeon: Wylene Simmer, MD;  Location: [facility];  Service: Orthopedics;  Laterality: Right;  Open reduction internal fixation right navicular fracture  . REFRACTIVE SURGERY    . SHOULDER ARTHROSCOPY Left   . SYNOVECTOMY Right 12/11/2015   Procedure: SYNOVECTOMY DISTAL RADIOULNAR JOINT LOOSE BODY REMOVAL ;  Surgeon: Roseanne Kaufman, MD;  Location: [facility];  Service: Orthopedics;  Laterality: Right;  . WRIST ARTHROSCOPY Right 12/11/2015   Procedure: RIGHT WRIST ARTHROSCOPY WITH DEBRIDEMENT ;  Surgeon: Roseanne Kaufman, MD;  Location: [facility];  Service: Orthopedics;  Laterality: Right;    FAMHx:  Family History  Problem Relation Age of Onset  . Hypertension Father   . Hypertension Mother   . Osteopenia Mother   . Osteopenia Maternal Grandmother     SOCHx:   reports that she quit smoking about 34 years ago. She has never used smokeless tobacco. She reports current alcohol use. She reports that she does not use drugs.  ALLERGIES:  Allergies  Allergen Reactions  . Hydrocodone-Acetaminophen Itching  . Lipitor [Atorvastatin Calcium] Other (See Comments)    Elevated liver enzymes  . Morphine And Related Nausea Only    Nausea and vomitting  . Tetracyclines & Related     Break out on all extremties    ROS: Pertinent items noted in HPI and remainder of comprehensive ROS otherwise negative.  HOME MEDS: Current Outpatient Medications on File Prior to Visit  Medication Sig Dispense Refill  . Alirocumab (PRALUENT) 150 MG/ML SOPN Inject 1 Dose into the  skin every 14 (fourteen) days. 2 pen 11  . atenolol (TENORMIN) 50 MG tablet TAKE 1 TABLET (50 MG TOTAL) BY MOUTH AT BEDTIME. 90 tablet 1  . Calcium Citrate-Vitamin D 315-250 MG-UNIT TABS Take 1 tablet by mouth 2 (two) times daily.    . hydrochlorothiazide (HYDRODIURIL) 25 MG tablet TAKE 1 TABLET (25 MG TOTAL) BY MOUTH DAILY. 90 tablet 3  . losartan (COZAAR) 100 MG tablet Take 1 tablet (100 mg total) by mouth daily. 90 tablet 3  . meloxicam (MOBIC) 15 MG tablet Take 1 tablet (15 mg total) by mouth daily. 30 tablet 0  . MULTIPLE VITAMINS-MINERALS PO Take by mouth.    . Omega-3 Fatty Acids (FISH OIL) 1000 MG CAPS Take 1,400 mg by mouth.     . pantoprazole (PROTONIX) 40 MG tablet TAKE 1 TABLET BY MOUTH DAILY. 90 tablet 3  . Pitavastatin Calcium (LIVALO) 1 MG TABS Take 1 tablet (1 mg total) by mouth daily. 90 tablet 3  . traZODone (DESYREL) 50 MG tablet Take 0.5-1 tablets (25-50 mg total) by mouth at bedtime as  needed for sleep. 30 tablet 3  . valACYclovir (VALTREX) 500 MG tablet Take 500 mg by mouth 2 (two) times daily.    . Vitamin D, Ergocalciferol, (DRISDOL) 1.25 MG (50000 UT) CAPS capsule Take 1 capsule (50,000 Units total) by mouth every 7 (seven) days. 12 capsule 0   No current facility-administered medications on file prior to visit.     LABS/IMAGING: No results found for this or any previous visit (from the past 48 hour(s)). No results found.  LIPID PANEL:    Component Value Date/Time   CHOL 132 12/11/2018 1057   TRIG 173 (H) 12/11/2018 1057   HDL 57 12/11/2018 1057   CHOLHDL 2.3 12/11/2018 1057   CHOLHDL 6 11/21/2017 0913   VLDL 58.0 (H) 11/21/2017 0913   LDLCALC 40 12/11/2018 1057   LDLDIRECT 146.0 11/21/2017 0913     WEIGHTS: Wt Readings from Last 3 Encounters:  12/19/18 172 lb (78 kg)  12/11/18 169 lb (76.7 kg)  12/04/18 168 lb (76.2 kg)    VITALS: BP 126/82   Pulse 64   Ht 5' 9"  (1.753 m)   Wt 172 lb (78 kg)   BMI 25.40 kg/m    EXAM: Deferred  EKG: Deferred  ASSESSMENT: 1. Dyslipidemia -low 10-year risk of 5% 2. CAC of 6 (03/2018) 3. Family history of dyslipidemia and hypertension 4. Hypertension-controlled 5. NAFLD 6. Subcentimeter pulmonary nodules  PLAN: 1.   [name] [name] is tolerating Praluent with marked reduction in her lipid profile.  Triglycerides remain mildly elevated however could probably be further medicated with dietary changes.  I would recommend she stay on low-dose Livalo 1 mg daily due to the pleiotropic benefits of statin therapy as this is her max tolerated dose.  Liver enzymes fortunately are stable and normal at this point.  She has had patient assistance and is now getting the PCSK9 inhibitor for free.  We will plan on continuing this with a repeat visit in follow-up in 1 year or sooner as necessary.  Pixie Casino, MD, [facility], [place] Director of the [facility] Diplomate of the American Board of Clinical Lipidology Attending Cardiologist  Direct Dial: [phone]  Fax: [phone]  Website:  [email or web address] 12/19/2018, 11:31 AM

## 2018-12-27 ENCOUNTER — Other Ambulatory Visit: Payer: Self-pay | Admitting: Family Medicine

## 2018-12-27 DIAGNOSIS — Z1231 Encounter for screening mammogram for malignant neoplasm of breast: Secondary | ICD-10-CM

## 2019-01-03 NOTE — Progress Notes (Signed)
[name] [address], [place] [address] Phone: [phone] Subjective:     CC: Hip pain follow-up  [id number]  [name] [name] is a 58 y.o. female coming in with complaint of left hip pain.  Patient was seen previously and because of the decreased range of motion concern for arthritic changes.  Patient did have x-rays done previously.  X-rays were independently visualized by me showing no significant bony abnormality.  Patient states that unfortunately though even with conservative therapy and range of motion exercises patient is not making any improvement.  Patient states if anything worsening.  Affecting daily activities as well as her job performance.  Patient is started on trazodone at night that has helped sleep but otherwise without the trazodone would not be able to.  Patient states that cannot walk greater than 200 feet without significant amount of pain in the groin and lateral aspect of the hip area.     Past Medical History:  Diagnosis Date  . Cerebral aneurysm 1999   Surgically coiled  . DDD (degenerative disc disease)   . GERD (gastroesophageal reflux disease)   . History of Barrett's esophagus   . Hypercholesterolemia   . Hypertension   . Irritable bowel disease   . Sinus disease    Past Surgical History:  Procedure Laterality Date  . ABDOMINAL HYSTERECTOMY  2005  . APPENDECTOMY    . ARTHROTOMY Right 12/11/2015   Procedure: OPEN ARTHROTOMY ;  Surgeon: Roseanne Kaufman, MD;  Location: [facility];  Service: Orthopedics;  Laterality: Right;  . AUGMENTATION MAMMAPLASTY Bilateral   . BREAST ENHANCEMENT SURGERY    . CEREBRAL ANEURYSM REPAIR  1999  . DIAGNOSTIC LAPAROSCOPY    . LUMBAR DISC SURGERY Left 06/03/2010   Procedure: ANTEROLATERAL DECOMPRESSION AND ARTHRODESIS L3-4, L4-5  . NASAL SINUS SURGERY  2013  . ORIF ANKLE FRACTURE  03/01/2012   Procedure: OPEN REDUCTION INTERNAL FIXATION (ORIF) ANKLE FRACTURE;   Surgeon: Wylene Simmer, MD;  Location: [facility];  Service: Orthopedics;  Laterality: Right;  Open reduction internal fixation right navicular fracture  . REFRACTIVE SURGERY    . SHOULDER ARTHROSCOPY Left   . SYNOVECTOMY Right 12/11/2015   Procedure: SYNOVECTOMY DISTAL RADIOULNAR JOINT LOOSE BODY REMOVAL ;  Surgeon: Roseanne Kaufman, MD;  Location: [facility];  Service: Orthopedics;  Laterality: Right;  . WRIST ARTHROSCOPY Right 12/11/2015   Procedure: RIGHT WRIST ARTHROSCOPY WITH DEBRIDEMENT ;  Surgeon: Roseanne Kaufman, MD;  Location: [facility];  Service: Orthopedics;  Laterality: Right;   Social History   Socioeconomic History  . Marital status: Divorced    Spouse name: Not on file  . Number of children: Not on file  . Years of education: Not on file  . Highest education level: Not on file  Occupational History    Employer: [employer]  . Financial resource strain: Not on file  . Food insecurity:    Worry: Not on file    Inability: Not on file  . Transportation needs:    Medical: Not on file    Non-medical: Not on file  Tobacco Use  . Smoking status: Former Smoker    Last attempt to quit: 11/14/1984    Years since quitting: 34.1  . Smokeless tobacco: Never Used  Substance and Sexual Activity  . Alcohol use: Yes    Comment: social  . Drug use: No  . Sexual activity: Not on file  Lifestyle  .  Physical activity:    Days per week: Not on file    Minutes per session: Not on file  . Stress: Not on file  Relationships  . Social connections:    Talks on phone: Not on file    Gets together: Not on file    Attends religious service: Not on file    Active member of club or organization: Not on file    Attends meetings of clubs or organizations: Not on file    Relationship status: Not on file  Other Topics Concern  . Not on file  Social History Narrative   The patient works as a Marine scientist in the electrophysiology department.  Her daughter and 33-monthold granddaughter live with her. She does not smoke cigarettes.   Allergies  Allergen Reactions  . Hydrocodone-Acetaminophen Itching  . Lipitor [Atorvastatin Calcium] Other (See Comments)    Elevated liver enzymes  . Morphine And Related Nausea Only    Nausea and vomitting  . Tetracyclines & Related     Break out on all extremties   Family History  Problem Relation Age of Onset  . Hypertension Father   . Hypertension Mother   . Osteopenia Mother   . Osteopenia Maternal Grandmother      Current Outpatient Medications (Cardiovascular):  .Marland Kitchen Alirocumab (PRALUENT) 150 MG/ML SOPN, Inject 1 Dose into the skin every 14 (fourteen) days. .Marland Kitchen atenolol (TENORMIN) 50 MG tablet, TAKE 1 TABLET (50 MG TOTAL) BY MOUTH AT BEDTIME. .  hydrochlorothiazide (HYDRODIURIL) 25 MG tablet, TAKE 1 TABLET (25 MG TOTAL) BY MOUTH DAILY. .Marland Kitchen losartan (COZAAR) 100 MG tablet, Take 1 tablet (100 mg total) by mouth daily. .  Pitavastatin Calcium (LIVALO) 1 MG TABS, Take 1 tablet (1 mg total) by mouth daily.   Current Outpatient Medications (Analgesics):  .  meloxicam (MOBIC) 15 MG tablet, Take 1 tablet (15 mg total) by mouth daily.   Current Outpatient Medications (Other):  .Marland Kitchen Calcium Citrate-Vitamin D 315-250 MG-UNIT TABS, Take 1 tablet by mouth 2 (two) times daily. .  MULTIPLE VITAMINS-MINERALS PO, Take by mouth. .  Omega-3 Fatty Acids (FISH OIL) 1000 MG CAPS, Take 1,400 mg by mouth.  .  pantoprazole (PROTONIX) 40 MG tablet, TAKE 1 TABLET BY MOUTH DAILY. .  traZODone (DESYREL) 50 MG tablet, Take 0.5-1 tablets (25-50 mg total) by mouth at bedtime as needed for sleep. .  valACYclovir (VALTREX) 500 MG tablet, Take 500 mg by mouth 2 (two) times daily. .  Vitamin D, Ergocalciferol, (DRISDOL) 1.25 MG (50000 UT) CAPS capsule, Take 1 capsule (50,000 Units total) by mouth every 7 (seven) days. .  diazepam (VALIUM) 5 MG tablet, One tab by mouth, 2 hours before procedure.    Past medical  history, social, surgical and family history all reviewed in electronic medical record.  No pertanent information unless stated regarding to the chief complaint.   Review of Systems:  No headache, visual changes, nausea, vomiting, diarrhea, constipation, dizziness, abdominal pain, skin rash, fevers, chills, night sweats, weight loss, swollen lymph nodes, body aches, joint swelling, chest pain, shortness of breath, mood changes.  Positive muscle aches  Objective  Blood pressure 122/84, pulse 71, height 5' 9"  (1.753 m), weight 173 lb (78.5 kg), SpO2 98 %.   General: No apparent distress alert and oriented x3 mood and affect normal, dressed appropriately.  HEENT: Pupils equal, extraocular movements intact  Respiratory: Patient's speak in full sentences and does not appear short of breath  Cardiovascular: No lower extremity edema, non  tender, no erythema  Skin: Warm dry intact with no signs of infection or rash on extremities or on axial skeleton.  Abdomen: Soft nontender  Neuro: Cranial nerves II through XII are intact, neurovascularly intact in all extremities with 2+ DTRs and 2+ pulses.  Lymph: No lymphadenopathy of posterior or anterior cervical chain or axillae bilaterally.  Gait antalgic MSK:  Non tender with full range of motion and good stability and symmetric strength and tone of shoulders, elbows, wrist, hip, knee and ankles bilaterally.  Left hip exam shows the patient does have decreasing range of motion and more tenderness to palpation over the anterior lateral aspect and in the intra-articular region.  Patient has no pain with resisted flexion of the hip.  Pain though with a fulcrum test.  No masses appreciated.  Negative straight leg test but pain Patient still has decreased internal range of motion lacking the last 10 degrees which is a laterally improved.  Pain though noted on the anterior lateral hip line.  Patient has pain with external rotation range of motion and swallow.  But  near full external range of motion though noted.    Impression and Recommendations:     This case required medical decision making of moderate complexity. The above documentation has been reviewed and is accurate and complete Lyndal Pulley, DO       Note: This dictation was prepared with Dragon dictation along with smaller phrase technology. Any transcriptional errors that result from this process are unintentional.

## 2019-01-07 ENCOUNTER — Encounter: Payer: Self-pay | Admitting: Family Medicine

## 2019-01-07 ENCOUNTER — Ambulatory Visit: Payer: 59 | Admitting: Family Medicine

## 2019-01-07 VITALS — BP 122/84 | HR 71 | Ht 69.0 in | Wt 173.0 lb

## 2019-01-07 DIAGNOSIS — M25552 Pain in left hip: Secondary | ICD-10-CM

## 2019-01-07 MED ORDER — DIAZEPAM 5 MG PO TABS: ORAL_TABLET | ORAL | 0 refills | Status: DC

## 2019-01-07 MED FILL — diazePAM 5 MG TABS: 5 | 2 days supply | Qty: 2 | Fill #0

## 2019-01-07 NOTE — Patient Instructions (Signed)
Good to see you  I am sorry but need MRI to see what is going on.  See me again 1-2 days after MRI and we can discuss or I will write you in my chart and we can go from there

## 2019-01-07 NOTE — Assessment & Plan Note (Signed)
Patient's pain is out of proportion to the amount noted on exam today.  Patient also has a significant antalgic gait.  Failed conservative therapy.  Patient though is having worsening pain.  Differential includes labral pathology, occult fracture, with the possibility of joint effusion and frozen hip.  MR arthrogram ordered.  Follow-up again after imaging to discuss.  Spent  25 minutes with patient face-to-face and had greater than 50% of counseling including as described above in assessment and plan.

## 2019-01-09 ENCOUNTER — Other Ambulatory Visit: Payer: Self-pay | Admitting: Family Medicine

## 2019-01-09 MED FILL — MELOXICAM 15 MG TABLET: 15 | 30 days supply | Qty: 30 | Fill #0

## 2019-01-09 MED FILL — traZODone HCL 50 MG TABS: 50 | 30 days supply | Qty: 30 | Fill #1 | Status: TO

## 2019-01-15 MED FILL — PRALUENT 150 MG/ML PEN: 150 | 28 days supply | Qty: 2 | Fill #5

## 2019-01-23 ENCOUNTER — Ambulatory Visit
Admission: RE | Admit: 2019-01-23 | Discharge: 2019-01-23 | Disposition: A | Discharge status: Home / Self Care | Payer: 59 | Source: Ambulatory Visit | Attending: Family Medicine | Admitting: Family Medicine

## 2019-01-23 ENCOUNTER — Other Ambulatory Visit: Payer: Self-pay

## 2019-01-23 DIAGNOSIS — M25552 Pain in left hip: Secondary | ICD-10-CM

## 2019-01-23 MED ORDER — IOPAMIDOL (ISOVUE-M 200) INJECTION 41%
15.0000 mL | Freq: Once | INTRAMUSCULAR | Status: AC
  Administered 2019-01-23: 15 mL via INTRA_ARTICULAR

## 2019-01-26 NOTE — Progress Notes (Signed)
[name] [name], [place] [address] Phone: [phone] Subjective:   [name] [name], am serving as a scribe for Dr. Hulan Saas.  CC: Left hip pain  SHF:[id number]   01/07/2019: Patient's pain is out of proportion to the amount noted on exam today.  Patient also has a significant antalgic gait.  Failed conservative therapy.  Patient though is having worsening pain.  Differential includes labral pathology, occult fracture, with the possibility of joint effusion and frozen hip.  MR arthrogram ordered.  Follow-up again after imaging to discuss.  Spent  25 minutes with patient face-to-face and had greater than 50% of counseling including as described above in assessment and plan.'  Update 01/28/2019: [name] [name] is a 58 y.o. female coming in with complaint of left hip pain. Patient feels that overall her hip is better but the pain is persisting. She is having pain with walking and descending stairs but not with ascending stairs. Is unable to sleep supine so has to sleep on the couch as this is more comfortable. Pain continues to occur over the greater trochanter. Has noticed some left glute pain due to walking differently.  Continues to get adjustments from chiropractor.     Patient did have an MR arthrogram done of the left hip that was fairly unremarkable.  Did found to have some degenerative changes at L4-L5 history of interbody fusion L3-L4 and L4-L5  Past Medical History:  Diagnosis Date  . Cerebral aneurysm 1999   Surgically coiled  . DDD (degenerative disc disease)   . GERD (gastroesophageal reflux disease)   . History of Barrett's esophagus   . Hypercholesterolemia   . Hypertension   . Irritable bowel disease   . Sinus disease    Past Surgical History:  Procedure Laterality Date  . ABDOMINAL HYSTERECTOMY  2005  . APPENDECTOMY    . ARTHROTOMY Right 12/11/2015   Procedure: OPEN ARTHROTOMY ;  Surgeon: [name] Kaufman, MD;  Location:  [facility];  Service: Orthopedics;  Laterality: Right;  . AUGMENTATION MAMMAPLASTY Bilateral   . BREAST ENHANCEMENT SURGERY    . CEREBRAL ANEURYSM REPAIR  1999  . DIAGNOSTIC LAPAROSCOPY    . LUMBAR DISC SURGERY Left 06/03/2010   Procedure: ANTEROLATERAL DECOMPRESSION AND ARTHRODESIS L3-4, L4-5  . NASAL SINUS SURGERY  2013  . ORIF ANKLE FRACTURE  03/01/2012   Procedure: OPEN REDUCTION INTERNAL FIXATION (ORIF) ANKLE FRACTURE;  Surgeon: [name] Simmer, MD;  Location: [facility];  Service: Orthopedics;  Laterality: Right;  Open reduction internal fixation right navicular fracture  . REFRACTIVE SURGERY    . SHOULDER ARTHROSCOPY Left   . SYNOVECTOMY Right 12/11/2015   Procedure: SYNOVECTOMY DISTAL RADIOULNAR JOINT LOOSE BODY REMOVAL ;  Surgeon: [name] Kaufman, MD;  Location: [facility];  Service: Orthopedics;  Laterality: Right;  . WRIST ARTHROSCOPY Right 12/11/2015   Procedure: RIGHT WRIST ARTHROSCOPY WITH DEBRIDEMENT ;  Surgeon: [name] Kaufman, MD;  Location: [facility];  Service: Orthopedics;  Laterality: Right;   Social History   Socioeconomic History  . Marital status: Divorced    Spouse name: Not on file  . Number of children: Not on file  . Years of education: Not on file  . Highest education level: Not on file  Occupational History    Employer: [employer]  . Financial resource strain: Not on file  . Food insecurity:    Worry: Not on file    Inability:  Not on file  . Transportation needs:    Medical: Not on file    Non-medical: Not on file  Tobacco Use  . Smoking status: Former Smoker    Last attempt to quit: 11/14/1984    Years since quitting: 34.2  . Smokeless tobacco: Never Used  Substance and Sexual Activity  . Alcohol use: Yes    Comment: social  . Drug use: [name]  . Sexual activity: Not on file  Lifestyle  . Physical activity:    Days per week: Not on file    Minutes per session: Not on file  .  Stress: Not on file  Relationships  . Social connections:    Talks on phone: Not on file    Gets together: Not on file    Attends religious service: Not on file    Active member of club or organization: Not on file    Attends meetings of clubs or organizations: Not on file    Relationship status: Not on file  Other Topics Concern  . Not on file  Social History Narrative   The patient works as a Marine scientist in the electrophysiology department. Her daughter and 42-monthold granddaughter live with her. She does not smoke cigarettes.   Allergies  Allergen Reactions  . Hydrocodone-Acetaminophen Itching  . Lipitor [Atorvastatin Calcium] Other (See Comments)    Elevated liver enzymes  . Morphine And Related Nausea Only    Nausea and vomitting  . Tetracyclines & Related     Break out on all extremties   Family History  Problem Relation Age of Onset  . Hypertension Father   . Hypertension Mother   . Osteopenia Mother   . Osteopenia Maternal Grandmother      Current Outpatient Medications (Cardiovascular):  .Marland Kitchen Alirocumab (PRALUENT) 150 MG/ML SOPN, Inject 1 Dose into the skin every 14 (fourteen) days. .Marland Kitchen atenolol (TENORMIN) 50 MG tablet, TAKE 1 TABLET (50 MG TOTAL) BY MOUTH AT BEDTIME. .  hydrochlorothiazide (HYDRODIURIL) 25 MG tablet, TAKE 1 TABLET (25 MG TOTAL) BY MOUTH DAILY. .Marland Kitchen losartan (COZAAR) 100 MG tablet, Take 1 tablet (100 mg total) by mouth daily. .  Pitavastatin Calcium (LIVALO) 1 MG TABS, Take 1 tablet (1 mg total) by mouth daily.   Current Outpatient Medications (Analgesics):  .  meloxicam (MOBIC) 15 MG tablet, TAKE 1 TABLET BY MOUTH DAILY.   Current Outpatient Medications (Other):  .Marland Kitchen Calcium Citrate-Vitamin D 315-250 MG-UNIT TABS, Take 1 tablet by mouth 2 (two) times daily. .  diazepam (VALIUM) 5 MG tablet, One tab by mouth, 2 hours before procedure. .  MULTIPLE VITAMINS-MINERALS PO, Take by mouth. .  Omega-3 Fatty Acids (FISH OIL) 1000 MG CAPS, Take 1,400 mg by  mouth.  .  pantoprazole (PROTONIX) 40 MG tablet, TAKE 1 TABLET BY MOUTH DAILY. .  traZODone (DESYREL) 50 MG tablet, Take 0.5-1 tablets (25-50 mg total) by mouth at bedtime as needed for sleep. .  valACYclovir (VALTREX) 500 MG tablet, Take 500 mg by mouth 2 (two) times daily. .  Vitamin D, Ergocalciferol, (DRISDOL) 1.25 MG (50000 UT) CAPS capsule, Take 1 capsule (50,000 Units total) by mouth every 7 (seven) days.    Past medical history, social, surgical and family history all reviewed in electronic medical record.  [name] pertanent information unless stated regarding to the chief complaint.   Review of Systems:  [name] headache, visual changes, nausea, vomiting, diarrhea, constipation, dizziness, abdominal pain, skin rash, fevers, chills, night sweats, weight loss, swollen lymph nodes, body  aches, joint swelling,, chest pain, shortness of breath, mood changes.  Positive muscle aches  Objective  Blood pressure 120/84, pulse 74, height 5' 9"  (1.753 m), weight 171 lb (77.6 kg), SpO2 98 %.    General: [name] apparent distress alert and oriented x3 mood and affect normal, dressed appropriately.  HEENT: Pupils equal, extraocular movements intact  Respiratory: Patient's speak in full sentences and does not appear short of breath  Cardiovascular: [name] lower extremity edema, non tender, [name] erythema  Skin: Warm dry intact with [name] signs of infection or rash on extremities or on axial skeleton.  Abdomen: Soft nontender  Neuro: Cranial nerves II through XII are intact, neurovascularly intact in all extremities with 2+ DTRs and 2+ pulses.  Lymph: [name] lymphadenopathy of posterior or anterior cervical chain or axillae bilaterally.  Gait normal with good balance and coordination.  MSK:  Non tender with full range of motion and good stability and symmetric strength and tone of shoulders, elbows, wrist, hip, knee and ankles bilaterally.  Back Exam:  Inspection: Loss of lordosis Motion: Flexion 40 deg, Extension 25 deg,  Side Bending to 35 deg bilaterally,  Rotation to 25 deg bilaterally  SLR laying: Negative  XSLR laying: Negative  Palpable tenderness: Tenderness still noted over the lateral joint line of the hip.  Mild in the groin area as well.Marland Kitchen FABER: Positive decreased range of motion with internal rotation of the hip as well.. Sensory change: Gross sensation intact to all lumbar and sacral dermatomes.  Reflexes: 2+ at both patellar tendons, 2+ at achilles tendons, Babinski's downgoing.  Strength at foot  Plantar-flexion: 5/5 Dorsi-flexion: 5/5 Eversion: 5/5 Inversion: 5/5  Leg strength  Quad: 5/5 Hamstring: 5/5 Hip flexor: 5/5 Hip abductors: 4/5.  To the contralateral side    Impression and Recommendations:     This case required medical decision making of moderate complexity. The above documentation has been reviewed and is accurate and complete Lyndal Pulley, DO       Note: This dictation was prepared with Dragon dictation along with smaller phrase technology. Any transcriptional errors that result from this process are unintentional.

## 2019-01-28 ENCOUNTER — Ambulatory Visit: Payer: 59 | Admitting: Family Medicine

## 2019-01-28 ENCOUNTER — Ambulatory Visit: Payer: Self-pay

## 2019-01-28 ENCOUNTER — Other Ambulatory Visit: Payer: Self-pay

## 2019-01-28 VITALS — BP 120/84 | HR 74 | Ht 69.0 in | Wt 171.0 lb

## 2019-01-28 DIAGNOSIS — M25552 Pain in left hip: Secondary | ICD-10-CM

## 2019-01-28 NOTE — Patient Instructions (Signed)
Good to see you  I am hoping it gets better Try to increase activity where you can  If you want send [place] a message when you want to do the injection in the side of hip (GT and glut) 1/2 HCTZ for now and see if that helps with a little of the ache.  Otherwise got to look at the back more  I will await to hear from you

## 2019-01-28 NOTE — Assessment & Plan Note (Signed)
Left knee pain.  Patient's MRI did not show any significant findings.  Differential now includes more of a distal lateral gluteal tendon, greater trochanteric, or potentially the lumbar radiculopathy.  Patient has not wanted to do any type of injection at this point.  We discussed repeating formal physical therapy which patient does not think will be beneficial.  Patient still has some discomfort and pain from time to time.  Discussed continuing the meloxicam and more on a daily basis.  Patient will consider the injections and get back to [place].  All questions were answered today and patient will make a follow-up in the near future.  Spent  25 minutes with patient face-to-face and had greater than 50% of counseling including as described above in assessment and plan.

## 2019-01-31 ENCOUNTER — Other Ambulatory Visit: Payer: 59

## 2019-01-31 MED FILL — PRALUENT 150 MG/ML PEN: 150 | 28 days supply | Qty: 2 | Fill #6 | Status: TO

## 2019-02-07 ENCOUNTER — Other Ambulatory Visit: Payer: Self-pay | Admitting: Family Medicine

## 2019-02-07 MED FILL — MELOXICAM 15 MG TABLET: 15 | 30 days supply | Qty: 30 | Fill #0

## 2019-02-07 MED FILL — traZODone HCL 50 MG TABS: 50 | 30 days supply | Qty: 30 | Fill #0

## 2019-02-07 MED FILL — ATENOLOL 50 MG TABLET: 50 | 90 days supply | Qty: 90 | Fill #0

## 2019-02-08 ENCOUNTER — Ambulatory Visit: Payer: 59

## 2019-02-08 ENCOUNTER — Encounter: Payer: 59 | Admitting: Family Medicine

## 2019-03-01 MED FILL — PRALUENT 150 MG/ML PEN: 150 | 28 days supply | Qty: 2 | Fill #0

## 2019-03-07 ENCOUNTER — Ambulatory Visit: Payer: 59

## 2019-03-09 MED FILL — HYDROCHLOROTHIAZIDE 25 MG T: 25 | 90 days supply | Qty: 90 | Fill #0

## 2019-03-09 MED FILL — PANTOPRAZOLE SOD DR 40 MG T: 40 | 90 days supply | Qty: 90 | Fill #0

## 2019-03-09 MED FILL — LOSARTAN POTASSIUM 100 MG T: 100 | 90 days supply | Qty: 90 | Fill #0

## 2019-03-11 MED FILL — LIVALO 1 MG TABLET: 1 | 90 days supply | Qty: 90 | Fill #0

## 2019-04-11 MED FILL — PRALUENT 150 MG/ML PEN: 150 | 28 days supply | Qty: 2 | Fill #1

## 2019-04-11 NOTE — Progress Notes (Signed)
[name] [place], [place] [address] Phone: [phone] Subjective:   I [name] [name] am serving as a Education administrator for Dr. Hulan Saas.  I'm seeing this patient by the request  of:    CC: Left.  Hip pain  [id number]  [name] [name] is a 58 y.o. female coming in with complaint of left hip pain. Would like to know what exactly is going on. Feels like her hip is out of joint with every step. States that she doesn't necessarily need something for pain. Walking with a limp. Feels "unstable" and is not functioning normally. Patient states he continues to have discomfort and pain now the increase in instability to what is concerning patient.  Patient has had an MRI of the hip that was completely unremarkable. This was independently visualized by me  Patient has moderate to severe degenerative changes though at L4-L5 as well as a right paracentral disc extrusion and facet hypertrophic and multiple levels in the spine.  Past Medical History:  Diagnosis Date  . Cerebral aneurysm 1999   Surgically coiled  . DDD (degenerative disc disease)   . GERD (gastroesophageal reflux disease)   . History of Barrett's esophagus   . Hypercholesterolemia   . Hypertension   . Irritable bowel disease   . Sinus disease    Past Surgical History:  Procedure Laterality Date  . ABDOMINAL HYSTERECTOMY  2005  . APPENDECTOMY    . ARTHROTOMY Right 12/11/2015   Procedure: OPEN ARTHROTOMY ;  Surgeon: Roseanne Kaufman, MD;  Location: [facility];  Service: Orthopedics;  Laterality: Right;  . AUGMENTATION MAMMAPLASTY Bilateral   . BREAST ENHANCEMENT SURGERY    . CEREBRAL ANEURYSM REPAIR  1999  . DIAGNOSTIC LAPAROSCOPY    . LUMBAR DISC SURGERY Left 06/03/2010   Procedure: ANTEROLATERAL DECOMPRESSION AND ARTHRODESIS L3-4, L4-5  . NASAL SINUS SURGERY  2013  . ORIF ANKLE FRACTURE  03/01/2012   Procedure: OPEN REDUCTION INTERNAL FIXATION (ORIF) ANKLE FRACTURE;   Surgeon: Wylene Simmer, MD;  Location: [facility];  Service: Orthopedics;  Laterality: Right;  Open reduction internal fixation right navicular fracture  . REFRACTIVE SURGERY    . SHOULDER ARTHROSCOPY Left   . SYNOVECTOMY Right 12/11/2015   Procedure: SYNOVECTOMY DISTAL RADIOULNAR JOINT LOOSE BODY REMOVAL ;  Surgeon: Roseanne Kaufman, MD;  Location: [facility];  Service: Orthopedics;  Laterality: Right;  . WRIST ARTHROSCOPY Right 12/11/2015   Procedure: RIGHT WRIST ARTHROSCOPY WITH DEBRIDEMENT ;  Surgeon: Roseanne Kaufman, MD;  Location: [facility];  Service: Orthopedics;  Laterality: Right;   Social History   Socioeconomic History  . Marital status: Divorced    Spouse name: Not on file  . Number of children: Not on file  . Years of education: Not on file  . Highest education level: Not on file  Occupational History    Employer: [employer]  . Financial resource strain: Not on file  . Food insecurity:    Worry: Not on file    Inability: Not on file  . Transportation needs:    Medical: Not on file    Non-medical: Not on file  Tobacco Use  . Smoking status: Former Smoker    Last attempt to quit: 11/14/1984    Years since quitting: 34.4  . Smokeless tobacco: Never Used  Substance and Sexual Activity  . Alcohol use: Yes    Comment: social  . Drug use: No  .  Sexual activity: Not on file  Lifestyle  . Physical activity:    Days per week: Not on file    Minutes per session: Not on file  . Stress: Not on file  Relationships  . Social connections:    Talks on phone: Not on file    Gets together: Not on file    Attends religious service: Not on file    Active member of club or organization: Not on file    Attends meetings of clubs or organizations: Not on file    Relationship status: Not on file  Other Topics Concern  . Not on file  Social History Narrative   The patient works as a Marine scientist in the electrophysiology department.  Her daughter and 68-monthold granddaughter live with her. She does not smoke cigarettes.   Allergies  Allergen Reactions  . Hydrocodone-Acetaminophen Itching  . Lipitor [Atorvastatin Calcium] Other (See Comments)    Elevated liver enzymes  . Morphine And Related Nausea Only    Nausea and vomitting  . Tetracyclines & Related     Break out on all extremties   Family History  Problem Relation Age of Onset  . Hypertension Father   . Hypertension Mother   . Osteopenia Mother   . Osteopenia Maternal Grandmother      Current Outpatient Medications (Cardiovascular):  .Marland Kitchen Alirocumab (PRALUENT) 150 MG/ML SOPN, Inject 1 Dose into the skin every 14 (fourteen) days. .Marland Kitchen atenolol (TENORMIN) 50 MG tablet, TAKE 1 TABLET (50 MG TOTAL) BY MOUTH AT BEDTIME. .  hydrochlorothiazide (HYDRODIURIL) 25 MG tablet, TAKE 1 TABLET (25 MG TOTAL) BY MOUTH DAILY. .Marland Kitchen losartan (COZAAR) 100 MG tablet, Take 1 tablet (100 mg total) by mouth daily. .  Pitavastatin Calcium (LIVALO) 1 MG TABS, Take 1 tablet (1 mg total) by mouth daily.   Current Outpatient Medications (Analgesics):  .  meloxicam (MOBIC) 15 MG tablet, TAKE 1 TABLET BY MOUTH DAILY.   Current Outpatient Medications (Other):  .Marland Kitchen Calcium Citrate-Vitamin D 315-250 MG-UNIT TABS, Take 1 tablet by mouth 2 (two) times daily. .  diazepam (VALIUM) 5 MG tablet, One tab by mouth, 2 hours before procedure. .  MULTIPLE VITAMINS-MINERALS PO, Take by mouth. .  Omega-3 Fatty Acids (FISH OIL) 1000 MG CAPS, Take 1,400 mg by mouth.  .  pantoprazole (PROTONIX) 40 MG tablet, TAKE 1 TABLET BY MOUTH DAILY. .  traZODone (DESYREL) 50 MG tablet, Take 0.5-1 tablets (25-50 mg total) by mouth at bedtime as needed for sleep. .  valACYclovir (VALTREX) 500 MG tablet, Take 500 mg by mouth 2 (two) times daily. .  Vitamin D, Ergocalciferol, (DRISDOL) 1.25 MG (50000 UT) CAPS capsule, Take 1 capsule (50,000 Units total) by mouth every 7 (seven) days.    Past medical history, social,  surgical and family history all reviewed in electronic medical record.  No pertanent information unless stated regarding to the chief complaint.   Review of Systems:  No headache, visual changes, nausea, vomiting, diarrhea, constipation, dizziness, abdominal pain, skin rash, fevers, chills, night sweats, weight loss, swollen lymph nodes,  chest pain, shortness of breath, mood changes.  Positive muscle aches, body aches  Objective  Blood pressure 130/80, pulse 79, height 5' 9"  (1.753 m), weight 171 lb (77.6 kg), SpO2 98 %.   General: No apparent distress alert and oriented x3 mood and affect normal, dressed appropriately.  HEENT: Pupils equal, extraocular movements intact  Respiratory: Patient's speak in full sentences and does not appear short of breath  Cardiovascular:  No lower extremity edema, non tender, no erythema  Skin: Warm dry intact with no signs of infection or rash on extremities or on axial skeleton.  Abdomen: Soft nontender  Neuro: Cranial nerves II through XII are intact, neurovascularly intact in all extremities with 2+ DTRs and 2+ pulses.  Lymph: No lymphadenopathy of posterior or anterior cervical chain or axillae bilaterally.  Gait antalgic MSK:  tender with mild limited range of motion and good stability and symmetric strength and tone of shoulders, elbows, wrist,  knee and ankles bilaterally.   Patient was found on the left hand to have full range of motion which is significant improvement from previous exam.  Patient does have some mild atrophy of the thigh musculature compared to the contralateral side.  Patient does not have any type of mass in the inguinal area.  Tender to palpation over the left sacroiliac joint and paraspinal musculature lumbar spine.  Tightness of the hamstrings bilaterally.  Deep tendon reflexes intact    Impression and Recommendations:     This case required medical decision making of moderate complexity. The above documentation has been reviewed  and is accurate and complete Lyndal Pulley, DO       Note: This dictation was prepared with Dragon dictation along with smaller phrase technology. Any transcriptional errors that result from this process are unintentional.

## 2019-04-12 ENCOUNTER — Other Ambulatory Visit: Payer: Self-pay

## 2019-04-12 ENCOUNTER — Encounter: Payer: Self-pay | Admitting: Family Medicine

## 2019-04-12 ENCOUNTER — Ambulatory Visit (INDEPENDENT_AMBULATORY_CARE_PROVIDER_SITE_OTHER): Payer: 59 | Admitting: Family Medicine

## 2019-04-12 VITALS — BP 130/80 | HR 79 | Ht 69.0 in | Wt 171.0 lb

## 2019-04-12 DIAGNOSIS — M4807 Spinal stenosis, lumbosacral region: Secondary | ICD-10-CM | POA: Diagnosis not present

## 2019-04-12 DIAGNOSIS — M549 Dorsalgia, unspecified: Secondary | ICD-10-CM

## 2019-04-12 DIAGNOSIS — M4800 Spinal stenosis, site unspecified: Secondary | ICD-10-CM | POA: Insufficient documentation

## 2019-04-12 DIAGNOSIS — G8929 Other chronic pain: Secondary | ICD-10-CM

## 2019-04-12 NOTE — Patient Instructions (Signed)
Good to see you  I think it is the back  We will get an epidural  Call [phone] to schedule.  I would want you to see you again 2 weeks after injection

## 2019-04-12 NOTE — Assessment & Plan Note (Signed)
Patient on MRI and does have some very mild spinal stenosis.  Patient's symptoms through the shoulder may be having some difficulty with this.  Has had some possible weakness of the hip that could be contributing and could be some of the higher lumbar aspect.  We discussed with patient about a possible epidural.  Patient would like to try this but would like to keep the injection as low as possible and we will try an L5-S1.  We are hoping that this will make improvement even though patient is considering the weakness and more of the hip girdle.  Patient's MRI of the hip was completely unremarkable and I do not feel any surgical intervention at that level would be beneficial.  Patient has had laboratory work-up previously recently has had some done by primary care provider that did not show any significant abnormality.  Patient still just does not feel it is right and depending on patient's reaction we can consider other things such as piriformis injection or possible greater trochanteric injection.  We discussed the possibility of bone density.  We discussed that this is a difficult case in great direction on where to go at this time.  Patient though will do the injection and we will see how patient responds.  Spent  25 minutes with patient face-to-face and had greater than 50% of counseling including as described above in assessment and plan.

## 2019-04-22 ENCOUNTER — Other Ambulatory Visit: Payer: Self-pay | Admitting: Family Medicine

## 2019-04-22 MED FILL — traZODone HCL 50 MG TABS: 50 | 30 days supply | Qty: 30 | Fill #1

## 2019-04-23 MED FILL — ATENOLOL 50 MG TABLET: 50 | 90 days supply | Qty: 90 | Fill #0

## 2019-04-25 ENCOUNTER — Ambulatory Visit
Admission: RE | Admit: 2019-04-25 | Discharge: 2019-04-25 | Disposition: A | Discharge status: Home / Self Care | Payer: 59 | Source: Ambulatory Visit | Attending: Family Medicine | Admitting: Family Medicine

## 2019-04-25 ENCOUNTER — Other Ambulatory Visit: Payer: Self-pay

## 2019-04-25 DIAGNOSIS — G8929 Other chronic pain: Secondary | ICD-10-CM

## 2019-04-25 DIAGNOSIS — M5136 Other intervertebral disc degeneration, lumbar region: Secondary | ICD-10-CM | POA: Diagnosis not present

## 2019-04-25 DIAGNOSIS — M545 Low back pain: Secondary | ICD-10-CM | POA: Diagnosis not present

## 2019-04-25 MED ORDER — METHYLPREDNISOLONE ACETATE 40 MG/ML INJ SUSP (RADIOLOG
120.0000 mg | Freq: Once | INTRAMUSCULAR | Status: AC
  Administered 2019-04-25: 120 mg via EPIDURAL

## 2019-04-25 MED ORDER — IOPAMIDOL (ISOVUE-M 200) INJECTION 41%
1.0000 mL | Freq: Once | INTRAMUSCULAR | Status: AC
  Administered 2019-04-25: 09:00:00 1 mL via EPIDURAL

## 2019-04-25 NOTE — Discharge Instructions (Signed)

## 2019-04-29 ENCOUNTER — Other Ambulatory Visit: Payer: 59

## 2019-05-02 ENCOUNTER — Encounter: Payer: Self-pay | Admitting: *Deleted

## 2019-05-02 ENCOUNTER — Telehealth: Payer: Self-pay | Admitting: *Deleted

## 2019-05-02 NOTE — Telephone Encounter (Signed)
Pt made aware letter completed & she can pick it up at our office.

## 2019-05-02 NOTE — Telephone Encounter (Signed)
Pt called stating she had an epidural & you wrote a letter for seated work only for 2 weeks. Pt is requesting another letter for seated work only through 05/10/19.

## 2019-05-02 NOTE — Telephone Encounter (Signed)
I am fine with that 

## 2019-05-07 MED FILL — PRALUENT 150 MG/ML SOAJ: 150 | 28 days supply | Qty: 2 | Fill #0

## 2019-05-10 ENCOUNTER — Encounter: Payer: Self-pay | Admitting: *Deleted

## 2019-05-10 ENCOUNTER — Telehealth: Payer: Self-pay | Admitting: *Deleted

## 2019-05-10 NOTE — Telephone Encounter (Signed)
Pt made aware & will pick letter up at front desk.

## 2019-05-10 NOTE — Telephone Encounter (Signed)
Lets return to full duty after 4th of July weekend

## 2019-05-10 NOTE — Telephone Encounter (Signed)
Pt called stating that she has had some improvement since the epidural but not much. You have put her on seated work only which ends today. She would like to know if she should return to full-duty or wait.

## 2019-05-27 ENCOUNTER — Other Ambulatory Visit: Payer: Self-pay

## 2019-05-27 ENCOUNTER — Ambulatory Visit (INDEPENDENT_AMBULATORY_CARE_PROVIDER_SITE_OTHER): Payer: 59 | Admitting: Family Medicine

## 2019-05-27 VITALS — BP 134/82 | HR 80 | Temp 98.2°F | Ht 69.0 in | Wt 171.2 lb

## 2019-05-27 DIAGNOSIS — Z9189 Other specified personal risk factors, not elsewhere classified: Secondary | ICD-10-CM

## 2019-05-27 DIAGNOSIS — E782 Mixed hyperlipidemia: Secondary | ICD-10-CM

## 2019-05-27 DIAGNOSIS — I671 Cerebral aneurysm, nonruptured: Secondary | ICD-10-CM | POA: Diagnosis not present

## 2019-05-27 DIAGNOSIS — Z1159 Encounter for screening for other viral diseases: Secondary | ICD-10-CM

## 2019-05-27 DIAGNOSIS — Z23 Encounter for immunization: Secondary | ICD-10-CM

## 2019-05-27 DIAGNOSIS — Z114 Encounter for screening for human immunodeficiency virus [HIV]: Secondary | ICD-10-CM | POA: Diagnosis not present

## 2019-05-27 DIAGNOSIS — R945 Abnormal results of liver function studies: Secondary | ICD-10-CM | POA: Diagnosis not present

## 2019-05-27 DIAGNOSIS — I10 Essential (primary) hypertension: Secondary | ICD-10-CM | POA: Diagnosis not present

## 2019-05-27 DIAGNOSIS — K2270 Barrett's esophagus without dysplasia: Secondary | ICD-10-CM | POA: Diagnosis not present

## 2019-05-27 DIAGNOSIS — Z0000 Encounter for general adult medical examination without abnormal findings: Secondary | ICD-10-CM

## 2019-05-27 DIAGNOSIS — R7989 Other specified abnormal findings of blood chemistry: Secondary | ICD-10-CM

## 2019-05-27 LAB — CBC WITH DIFFERENTIAL/PLATELET
Basophils Absolute: 0 10*3/uL (ref 0.0–0.1)
Basophils Relative: 1 % (ref 0.0–3.0)
Eosinophils Absolute: 0.1 10*3/uL (ref 0.0–0.7)
Eosinophils Relative: 1.3 % (ref 0.0–5.0)
HCT: 37.3 % (ref 36.0–46.0)
Hemoglobin: 12.5 g/dL (ref 12.0–15.0)
Lymphocytes Relative: 33.8 % (ref 12.0–46.0)
Lymphs Abs: 1.7 10*3/uL (ref 0.7–4.0)
MCHC: 33.6 g/dL (ref 30.0–36.0)
MCV: 100.7 fl — ABNORMAL HIGH (ref 78.0–100.0)
Monocytes Absolute: 0.4 10*3/uL (ref 0.1–1.0)
Monocytes Relative: 8.2 % (ref 3.0–12.0)
Neutro Abs: 2.7 10*3/uL (ref 1.4–7.7)
Neutrophils Relative %: 55.7 % (ref 43.0–77.0)
Platelets: 228 10*3/uL (ref 150.0–400.0)
RBC: 3.71 Mil/uL — ABNORMAL LOW (ref 3.87–5.11)
RDW: 12.8 % (ref 11.5–15.5)
WBC: 4.9 10*3/uL (ref 4.0–10.5)

## 2019-05-27 NOTE — Progress Notes (Signed)
Subjective:    [name] [name] is a 58 y.o. female and is here for a comprehensive physical exam.  Health Maintenance Due  Topic Date Due  . PAP SMEAR-Modifier  06/17/1980  . COLONOSCOPY  06/17/2009  . TETANUS/TDAP  04/18/2018     Current Outpatient Medications:  .  Alirocumab (PRALUENT) 150 MG/ML SOPN, Inject 1 Dose into the skin every 14 (fourteen) days., Disp: 2 pen, Rfl: 11 .  atenolol (TENORMIN) 50 MG tablet, TAKE 1 TABLET (50 MG TOTAL) BY MOUTH AT BEDTIME., Disp: 90 tablet, Rfl: 0 .  Calcium Citrate-Vitamin D 315-250 MG-UNIT TABS, Take 1 tablet by mouth 2 (two) times daily., Disp: , Rfl:  .  hydrochlorothiazide (HYDRODIURIL) 25 MG tablet, TAKE 1 TABLET (25 MG TOTAL) BY MOUTH DAILY., Disp: 90 tablet, Rfl: 3 .  losartan (COZAAR) 100 MG tablet, Take 1 tablet (100 mg total) by mouth daily., Disp: 90 tablet, Rfl: 3 .  MULTIPLE VITAMINS-MINERALS PO, Take by mouth., Disp: , Rfl:  .  Omega-3 Fatty Acids (FISH OIL) 1000 MG CAPS, Take 1,400 mg by mouth. , Disp: , Rfl:  .  pantoprazole (PROTONIX) 40 MG tablet, TAKE 1 TABLET BY MOUTH DAILY., Disp: 90 tablet, Rfl: 3 .  Pitavastatin Calcium (LIVALO) 1 MG TABS, Take 1 tablet (1 mg total) by mouth daily., Disp: 90 tablet, Rfl: 3 .  valACYclovir (VALTREX) 500 MG tablet, Take 500 mg by mouth 2 (two) times daily., Disp: , Rfl:  .  traZODone (DESYREL) 50 MG tablet, Take 0.5-1 tablets (25-50 mg total) by mouth at bedtime as needed for sleep., Disp: 30 tablet, Rfl: 3  PMHx, SurgHx, SocialHx, Medications, and Allergies were reviewed in the Visit Navigator and updated as appropriate.   Past Medical History:  Diagnosis Date  . Cerebral aneurysm 1999   Surgically coiled  . DDD (degenerative disc disease)   . GERD (gastroesophageal reflux disease)   . History of Barrett's esophagus   . Hypercholesterolemia   . Hypertension   . Irritable bowel disease   . Sinus disease      Past Surgical History:  Procedure Laterality Date  . ABDOMINAL  HYSTERECTOMY  2005  . APPENDECTOMY    . ARTHROTOMY Right 12/11/2015   Procedure: OPEN ARTHROTOMY ;  Surgeon: Roseanne Kaufman, MD;  Location: [place];  Service: Orthopedics;  Laterality: Right;  . AUGMENTATION MAMMAPLASTY Bilateral   . BREAST ENHANCEMENT SURGERY    . CEREBRAL ANEURYSM REPAIR  1999  . DIAGNOSTIC LAPAROSCOPY    . LUMBAR DISC SURGERY Left 06/03/2010   Procedure: ANTEROLATERAL DECOMPRESSION AND ARTHRODESIS L3-4, L4-5  . NASAL SINUS SURGERY  2013  . ORIF ANKLE FRACTURE  03/01/2012   Procedure: OPEN REDUCTION INTERNAL FIXATION (ORIF) ANKLE FRACTURE;  Surgeon: Wylene Simmer, MD;  Location: [place];  Service: Orthopedics;  Laterality: Right;  Open reduction internal fixation right navicular fracture  . REFRACTIVE SURGERY    . SHOULDER ARTHROSCOPY Left   . SYNOVECTOMY Right 12/11/2015   Procedure: SYNOVECTOMY DISTAL RADIOULNAR JOINT LOOSE BODY REMOVAL ;  Surgeon: Roseanne Kaufman, MD;  Location: [place];  Service: Orthopedics;  Laterality: Right;  . WRIST ARTHROSCOPY Right 12/11/2015   Procedure: RIGHT WRIST ARTHROSCOPY WITH DEBRIDEMENT ;  Surgeon: Roseanne Kaufman, MD;  Location: [place];  Service: Orthopedics;  Laterality: Right;     Family History  Problem Relation Age of Onset  . Hypertension Father   . Hypertension Mother   . Osteopenia Mother   .  Osteopenia Maternal Grandmother     Social History   Tobacco Use  . Smoking status: Former Smoker    Quit date: 11/14/1984    Years since quitting: 34.5  . Smokeless tobacco: Never Used  Substance Use Topics  . Alcohol use: Yes    Comment: social  . Drug use: No    Review of Systems:   Pertinent items are noted in the HPI. Otherwise, ROS is negative.  Objective:   BP 134/82   Pulse 80   Temp 98.2 F (36.8 C) (Oral)   Ht 5' 9"  (1.753 m)   Wt 171 lb 3.2 oz (77.7 kg)   SpO2 96%   BMI 25.28 kg/m   General appearance: alert, cooperative and appears  stated age. Head: normocephalic, without obvious abnormality, atraumatic. Neck: no adenopathy, supple, symmetrical, trachea midline; thyroid not enlarged, symmetric, no tenderness/mass/nodules. Lungs: clear to auscultation bilaterally. Heart: regular rate and rhythm Abdomen: soft, non-tender; no masses,  no organomegaly. Extremities: extremities normal, atraumatic, no cyanosis or edema. Skin: skin color, texture, turgor normal, no rashes or lesions. Lymph: cervical, supraclavicular, and axillary nodes normal; no abnormal inguinal nodes palpated. Neurologic: grossly normal.  Assessment/Plan:   [name] was seen today for annual exam.  Diagnoses and all orders for this visit:  Routine physical examination  Elevated LFTs, fatty liver on [place] (09/28/17) -     Comprehensive metabolic panel  Mixed hyperlipidemia -     Lipid panel  Benign hypertension  Barrett's esophagus without dysplasia -     CBC with Differential/Platelet  Cerebral aneurysm  Encounter for hepatitis C virus screening test for high risk patient -     Hepatitis C antibody  Screening for HIV (human immunodeficiency virus) -     HIV Antibody (routine testing w rflx)  Need for diphtheria-tetanus-pertussis (Tdap) vaccine   Patient Counseling: [x]    Nutrition: Stressed importance of moderation in sodium/caffeine intake, saturated fat and cholesterol, caloric balance, sufficient intake of fresh fruits, vegetables, fiber, calcium, iron, and 1 mg of folate supplement per day (for females capable of pregnancy).  [x]    Stressed the importance of regular exercise.   [x]    Substance Abuse: Discussed cessation/primary prevention of tobacco, alcohol, or other drug use; driving or other dangerous activities under the influence; availability of treatment for abuse.   [x]    Injury prevention: Discussed safety belts, safety helmets, smoke detector, smoking near bedding or upholstery.   [x]    Sexuality: Discussed sexually transmitted  diseases, partner selection, use of condoms, avoidance of unintended pregnancy  and contraceptive alternatives.  [x]    Dental health: Discussed importance of regular tooth brushing, flossing, and dental visits.  [x]    Health maintenance and immunizations reviewed. Please refer to Health maintenance section.   Briscoe Deutscher, DO [place]

## 2019-05-28 LAB — COMPREHENSIVE METABOLIC PANEL
ALT: 24 U/L (ref 0–35)
AST: 26 U/L (ref 0–37)
Albumin: 5 g/dL (ref 3.5–5.2)
Alkaline Phosphatase: 75 U/L (ref 39–117)
BUN: 30 mg/dL — ABNORMAL HIGH (ref 6–23)
CO2: 32 mEq/L (ref 19–32)
Calcium: 10.5 mg/dL (ref 8.4–10.5)
Chloride: 97 mEq/L (ref 96–112)
Creatinine, Ser: 0.6 mg/dL (ref 0.40–1.20)
GFR: 101.99 mL/min (ref >60.00)
Glucose, Bld: 100 mg/dL — ABNORMAL HIGH (ref 70–99)
Potassium: 3.8 mEq/L (ref 3.5–5.1)
Sodium: 140 mEq/L (ref 135–145)
Total Bilirubin: 0.7 mg/dL (ref 0.2–1.2)
Total Protein: 7.6 g/dL (ref 6.0–8.3)

## 2019-05-28 LAB — HIV ANTIBODY (ROUTINE TESTING W REFLEX): HIV 1&2 Ab, 4th Generation: NONREACTIVE

## 2019-05-28 LAB — LIPID PANEL
Cholesterol: 199 mg/dL (ref 0–200)
HDL: 85.1 mg/dL (ref >39.00)
LDL Cholesterol: 91 mg/dL (ref 0–99)
NonHDL: 114.18
Total CHOL/HDL Ratio: 2
Triglycerides: 117 mg/dL (ref 0.0–149.0)
VLDL: 23.4 mg/dL (ref 0.0–40.0)

## 2019-05-28 LAB — HEPATITIS C ANTIBODY
Hepatitis C Ab: NONREACTIVE
SIGNAL TO CUT-OFF: 0.02 (ref <1.00)

## 2019-05-29 ENCOUNTER — Encounter: Payer: Self-pay | Admitting: Family Medicine

## 2019-05-29 ENCOUNTER — Other Ambulatory Visit: Payer: Self-pay

## 2019-05-29 ENCOUNTER — Ambulatory Visit (INDEPENDENT_AMBULATORY_CARE_PROVIDER_SITE_OTHER): Payer: 59 | Admitting: Family Medicine

## 2019-05-29 VITALS — BP 122/88 | HR 84 | Ht 69.0 in | Wt 171.0 lb

## 2019-05-29 DIAGNOSIS — M5416 Radiculopathy, lumbar region: Secondary | ICD-10-CM

## 2019-05-29 DIAGNOSIS — M4807 Spinal stenosis, lumbosacral region: Secondary | ICD-10-CM | POA: Diagnosis not present

## 2019-05-29 DIAGNOSIS — M25552 Pain in left hip: Secondary | ICD-10-CM

## 2019-05-29 MED ORDER — TRAZODONE HCL 50 MG PO TABS: 25.0000 mg | ORAL_TABLET | Freq: Every evening | ORAL | 3 refills | Status: DC | PRN

## 2019-05-29 MED FILL — traZODone HCL 50 MG TABS: 50 | 30 days supply | Qty: 30 | Fill #0

## 2019-05-29 NOTE — Progress Notes (Signed)
[name] [address], [place] [address] Phone: [phone] Subjective:     CC: Back and hip pain follow-up  [id number]  [name] [name] is a 57 y.o. female coming in with complaint of low back pain follow-up.  Patient has had chronic pain for some time and was found to have spinal stenosis of lumbar spine.  Had an epidural recently at the end of June.  Was making some progress.  Was to return to full duty work again after July 4.  Patient states that she did see some improvement following the epidural. Did return to work last week. Had an increase in pain throughout the day and was in tears by the end of the day. Also states that she has a very pointed pain to the left of the lumbar spine. Started one week ago when she was walking to her car and felt her hip "pop" out. Activity increases both her hip and back pain. Is not taking anything for pain. Occasionally uses trazadone prn.      Past Medical History:  Diagnosis Date  . Cerebral aneurysm 1999   Surgically coiled  . DDD (degenerative disc disease)   . GERD (gastroesophageal reflux disease)   . History of Barrett's esophagus   . Hypercholesterolemia   . Hypertension   . Irritable bowel disease   . Sinus disease    Past Surgical History:  Procedure Laterality Date  . ABDOMINAL HYSTERECTOMY  2005  . APPENDECTOMY    . ARTHROTOMY Right 12/11/2015   Procedure: OPEN ARTHROTOMY ;  Surgeon: Roseanne Kaufman, MD;  Location: [facility];  Service: Orthopedics;  Laterality: Right;  . AUGMENTATION MAMMAPLASTY Bilateral   . BREAST ENHANCEMENT SURGERY    . CEREBRAL ANEURYSM REPAIR  1999  . DIAGNOSTIC LAPAROSCOPY    . LUMBAR DISC SURGERY Left 06/03/2010   Procedure: ANTEROLATERAL DECOMPRESSION AND ARTHRODESIS L3-4, L4-5  . NASAL SINUS SURGERY  2013  . ORIF ANKLE FRACTURE  03/01/2012   Procedure: OPEN REDUCTION INTERNAL FIXATION (ORIF) ANKLE FRACTURE;  Surgeon: Wylene Simmer, MD;   Location: [facility];  Service: Orthopedics;  Laterality: Right;  Open reduction internal fixation right navicular fracture  . REFRACTIVE SURGERY    . SHOULDER ARTHROSCOPY Left   . SYNOVECTOMY Right 12/11/2015   Procedure: SYNOVECTOMY DISTAL RADIOULNAR JOINT LOOSE BODY REMOVAL ;  Surgeon: Roseanne Kaufman, MD;  Location: [facility];  Service: Orthopedics;  Laterality: Right;  . WRIST ARTHROSCOPY Right 12/11/2015   Procedure: RIGHT WRIST ARTHROSCOPY WITH DEBRIDEMENT ;  Surgeon: Roseanne Kaufman, MD;  Location: [facility];  Service: Orthopedics;  Laterality: Right;   Social History   Socioeconomic History  . Marital status: Divorced    Spouse name: Not on file  . Number of children: Not on file  . Years of education: Not on file  . Highest education level: Not on file  Occupational History    Employer: [employer]  . Financial resource strain: Not on file  . Food insecurity    Worry: Not on file    Inability: Not on file  . Transportation needs    Medical: Not on file    Non-medical: Not on file  Tobacco Use  . Smoking status: Former Smoker    Quit date: 11/14/1984    Years since quitting: 34.5  . Smokeless tobacco: Never Used  Substance and Sexual Activity  . Alcohol use: Yes    Comment:  social  . Drug use: No  . Sexual activity: Not on file  Lifestyle  . Physical activity    Days per week: Not on file    Minutes per session: Not on file  . Stress: Not on file  Relationships  . Social Herbalist on phone: Not on file    Gets together: Not on file    Attends religious service: Not on file    Active member of club or organization: Not on file    Attends meetings of clubs or organizations: Not on file    Relationship status: Not on file  Other Topics Concern  . Not on file  Social History Narrative   The patient works as a Marine scientist in the electrophysiology department. Her daughter and 67-monthold granddaughter  live with her. She does not smoke cigarettes.   Allergies  Allergen Reactions  . Hydrocodone-Acetaminophen Itching  . Lipitor [Atorvastatin Calcium] Other (See Comments)    Elevated liver enzymes  . Morphine And Related Nausea Only  . Tetracyclines & Related Rash    Break out on all extremties   Family History  Problem Relation Age of Onset  . Hypertension Father   . Hypertension Mother   . Osteopenia Mother   . Osteopenia Maternal Grandmother      Current Outpatient Medications (Cardiovascular):  .Mar[place] Kitchen Alirocumab (PRALUENT) 150 MG/ML SOPN, Inject 1 Dose into the skin every 14 (fourteen) days. .Mar[place] Kitchen atenolol (TENORMIN) 50 MG tablet, TAKE 1 TABLET (50 MG TOTAL) BY MOUTH AT BEDTIME. .  hydrochlorothiazide (HYDRODIURIL) 25 MG tablet, TAKE 1 TABLET (25 MG TOTAL) BY MOUTH DAILY. .Marland Kitchen losartan (COZAAR) 100 MG tablet, Take 1 tablet (100 mg total) by mouth daily. .  Pitavastatin Calcium (LIVALO) 1 MG TABS, Take 1 tablet (1 mg total) by mouth daily.     Current Outpatient Medications (Other):  .Mar[place] Kitchen Calcium Citrate-Vitamin D 315-250 MG-UNIT TABS, Take 1 tablet by mouth 2 (two) times daily. .  MULTIPLE VITAMINS-MINERALS PO, Take by mouth. .  Omega-3 Fatty Acids (FISH OIL) 1000 MG CAPS, Take 1,400 mg by mouth.  .  pantoprazole (PROTONIX) 40 MG tablet, TAKE 1 TABLET BY MOUTH DAILY. .  valACYclovir (VALTREX) 500 MG tablet, Take 500 mg by mouth 2 (two) times daily. .  traZODone (DESYREL) 50 MG tablet, Take 0.5-1 tablets (25-50 mg total) by mouth at bedtime as needed for sleep.    Past medical history, social, surgical and family history all reviewed in electronic medical record.  No pertanent information unless stated regarding to the chief complaint.   Review of Systems:  No headache, visual changes, nausea, vomiting, diarrhea, constipation, dizziness, abdominal pain, skin rash, fevers, chills, night sweats, weight loss, swollen lymph nodes, , chest pain, shortness of breath, mood changes.   Positive muscle aches, body aches  Objective  Blood pressure 122/88, pulse 84, height 5' 9"  (1.753 m), weight 171 lb (77.6 kg), SpO2 98 %.    General: No apparent distress alert and oriented x3 mood and affect normal, dressed appropriately.  HEENT: Pupils equal, extraocular movements intact  Respiratory: Patient's speak in full sentences and does not appear short of breath  Cardiovascular: No lower extremity edema, non tender, no erythema  Skin: Warm dry intact with no signs of infection or rash on extremities or on axial skeleton.  Abdomen: Soft nontender  Neuro: Cranial nerves II through XII are intact, neurovascularly intact in all extremities with 2+ DTRs and 2+ pulses.  Lymph: No lymphadenopathy  of posterior or anterior cervical chain or axillae bilaterally.  Gait normal with good balance and coordination.  MSK:  Non tender with full range of motion and good stability and symmetric strength and tone of shoulders, elbows, wrist, hip, knee and ankles bilaterally.  Back Exam:  Inspection: Loss of lordosis Motion: Flexion 35 deg, Extension 25 deg, Side Bending to 35 deg bilaterally,  Rotation to 45 deg bilaterally  SLR laying: Mild positive left side  XSLR laying: Negative  Palpable tenderness: Tender to palpation the paraspinal musculature lumbar spine (left. FABER: Tightness bilaterally. Sensory change: Gross sensation intact to all lumbar and sacral dermatomes.  Reflexes: 2+ at both patellar tendons, 2+ at achilles tendons, Babinski's downgoing.  Strength at foot  4-5 strength but symmetric    Impression and Recommendations:     This case required medical decision making of moderate complexity. The above documentation has been reviewed and is accurate and complete Lyndal Pulley, DO       Note: This dictation was prepared with Dragon dictation along with smaller phrase technology. Any transcriptional errors that result from this process are unintentional.

## 2019-05-29 NOTE — Assessment & Plan Note (Signed)
Patient does have a spinal stenosis and is having back surgery previously.  I do think that this was still what is contributing to most of her discomfort and pain.  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  I do think repeating this will be beneficial and restarting formal physical therapy.  Patient would like to do this plan.  Otherwise we will be sending patient to neurosurgery to discuss.

## 2019-05-29 NOTE — Patient Instructions (Signed)
Physical Therapy will be calling you Trazadone as needed at night for sleep See me again

## 2019-05-31 ENCOUNTER — Encounter: Payer: Self-pay | Admitting: Family Medicine

## 2019-06-11 ENCOUNTER — Other Ambulatory Visit: Payer: 59

## 2019-06-12 ENCOUNTER — Ambulatory Visit: Payer: 59 | Attending: Family Medicine | Admitting: Physical Therapy

## 2019-06-12 ENCOUNTER — Other Ambulatory Visit: Payer: Self-pay

## 2019-06-12 ENCOUNTER — Encounter: Payer: Self-pay | Admitting: Physical Therapy

## 2019-06-12 DIAGNOSIS — R262 Difficulty in walking, not elsewhere classified: Secondary | ICD-10-CM | POA: Diagnosis not present

## 2019-06-12 DIAGNOSIS — M25552 Pain in left hip: Secondary | ICD-10-CM | POA: Diagnosis not present

## 2019-06-12 MED FILL — LIVALO 1 MG TABLET: 1 | 90 days supply | Qty: 90 | Fill #0

## 2019-06-12 NOTE — Therapy (Signed)
[place], [place], [address] Phone: [phone]   Fax:  [phone]  Physical Therapy Evaluation  Patient Details  Name: [name] [name] MRN: [id number] Date of Birth: 1961/09/01 Referring Provider (PT): Hulan Saas, DO   Encounter Date: 06/12/2019  PT End of Session - 06/12/19 1116    Visit Number  1    Number of Visits  7    Date for PT Re-Evaluation  07/26/19    Authorization Type  [facility]    PT Start Time  1111    PT Stop Time  1209    PT Time Calculation (min)  58 min    Activity Tolerance  Patient tolerated treatment well    Behavior During Therapy  [facility] for tasks assessed/performed       Past Medical History:  Diagnosis Date  . Cerebral aneurysm 1999   Surgically coiled  . DDD (degenerative disc disease)   . GERD (gastroesophageal reflux disease)   . History of Barrett's esophagus   . Hypercholesterolemia   . Hypertension   . Irritable bowel disease   . Sinus disease     Past Surgical History:  Procedure Laterality Date  . ABDOMINAL HYSTERECTOMY  2005  . APPENDECTOMY    . ARTHROTOMY Right 12/11/2015   Procedure: OPEN ARTHROTOMY ;  Surgeon: Roseanne Kaufman, MD;  Location: [place];  Service: Orthopedics;  Laterality: Right;  . AUGMENTATION MAMMAPLASTY Bilateral   . BREAST ENHANCEMENT SURGERY    . CEREBRAL ANEURYSM REPAIR  1999  . DIAGNOSTIC LAPAROSCOPY    . LUMBAR DISC SURGERY Left 06/03/2010   Procedure: ANTEROLATERAL DECOMPRESSION AND ARTHRODESIS L3-4, L4-5  . NASAL SINUS SURGERY  2013  . ORIF ANKLE FRACTURE  03/01/2012   Procedure: OPEN REDUCTION INTERNAL FIXATION (ORIF) ANKLE FRACTURE;  Surgeon: Wylene Simmer, MD;  Location: [place];  Service: Orthopedics;  Laterality: Right;  Open reduction internal fixation right navicular fracture  . REFRACTIVE SURGERY    . SHOULDER ARTHROSCOPY Left   . SYNOVECTOMY Right 12/11/2015   Procedure: SYNOVECTOMY DISTAL  RADIOULNAR JOINT LOOSE BODY REMOVAL ;  Surgeon: Roseanne Kaufman, MD;  Location: [facility];  Service: Orthopedics;  Laterality: Right;  . WRIST ARTHROSCOPY Right 12/11/2015   Procedure: RIGHT WRIST ARTHROSCOPY WITH DEBRIDEMENT ;  Surgeon: Roseanne Kaufman, MD;  Location: [place];  Service: Orthopedics;  Laterality: Right;    There were no vitals filed for this visit.   Subjective Assessment - 06/12/19 1125    Subjective  Wears lead all day at work. Had a day in the lab in late November where it was a very long day and I had my lead on all day. The next day I had severe pain which progressed to hip pain after 4-5 days. It got to where I couldn't put weight on the toilet in my Lt leg. when I started to lose ROM I went to Dr Tamala Julian. Does not want to do injections. Have been going to chiropractor for about 1.5 years with no more than 2 weeks of break. It started feeling like something in my hip was popping out of place, the more I was on it the worse it got. I had to hold on to things so I didn't hit the floor. Over time it took longer for pain to decrease. Went back to Dr Tamala Julian. Did an epidural and seated duty for 4 weeks.I get a spasm feeling in upper lumbar spine which  moves into hip. Next thursday for another injection which did give some relief last time. Denies pain past hip. July 2018 fell off horse on Lt side and Fx Rt side of sacrum.    How long can you walk comfortably?  2-3 min from parking deck to hospital    Currently in Pain?  Yes    Pain Score  2     Pain Location  Hip    Pain Orientation  Left    Pain Descriptors / Indicators  Aching    Aggravating Factors   standing- increases with time    Pain Relieving Factors  rest         [facility] PT Assessment - 06/12/19 0001      Assessment   Medical Diagnosis  Lt hip pain    Referring Provider (PT)  Hulan Saas, DO    Onset Date/Surgical Date  --   Nov 2019   Hand Dominance  Right    Prior Therapy  no       Precautions   Precautions  None      Restrictions   Weight Bearing Restrictions  No      Balance Screen   Has the patient fallen in the past 6 months  No      Prior Function   Vocation  Full time employment    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    Leisure  rides horses      Cognition   Overall Cognitive Status  Within Functional Limits for tasks assessed      Observation/Other Assessments   Focus on Therapeutic Outcomes (FOTO)   54% limited      Sensation   Additional Comments  WFL      Posture/Postural Control   Posture Comments  rounded shoulders with decreased thoracic kyphosis      ROM / Strength   AROM / PROM / Strength  Strength      Strength   Strength Assessment Site  Hip    Right/Left Hip  Left    Left Hip Flexion  4/5    Left Hip ABduction  4/5      Palpation   Palpation comment  rt 36', Lt 36 1/4'                Objective measurements completed on examination: See above findings.      [facility] PT Treatment/Exercise - 06/12/19 0001      Exercises   Exercises  Knee/Hip      Knee/Hip Exercises: Stretches   Passive Hamstring Stretch Limitations  seated edge of bed    Piriformis Stretch Limitations  seated figure 4             PT Education - 06/12/19 1118    Education Details  anatomy of condition, POC, HEP, exercise form/rationale, heel lift, work Office manager, thoracic vs lumbar brace    Person(s) Educated  Patient    Methods  Explanation;Demonstration;Tactile cues;Verbal cues;Handout    Comprehension  Verbalized understanding;Returned demonstration;Verbal cues required;Tactile cues required;Need further instruction       PT Short Term Goals - 06/12/19 1244      PT SHORT TERM GOAL #1   Title  at least 50% improvement in pain in ADLs & work activities    Baseline  can be severe and debilitating at eval    Time  3    Period  Weeks    Status  New    Target Date  07/03/19  PT Long Term Goals -  06/12/19 1238      PT LONG TERM GOAL #1   Title  hip abductor strength 5/5    Baseline  4/5 at eval    Time  6    Period  Weeks    Status  New    Target Date  07/26/19      PT LONG TERM GOAL #2   Title  Able to walk for at least 30 min without limitation by hip pain    Baseline  2-3 min at eval    Time  6    Period  Weeks    Status  New    Target Date  07/26/19      PT LONG TERM GOAL #3   Title  will be indpendent in stretching regimen to do throughout the day at work for long term care    Baseline  will establish appropriate program    Time  6    Period  Weeks    Status  New    Target Date  07/26/19      PT LONG TERM GOAL #4   Title  FOTO to 38% limited    Baseline  54% limited at eval    Time  6    Period  Weeks    Status  New             Plan - 06/12/19 1229    Clinical Impression Statement  Pt presents to PT with complaints of Lt hip and LBP that has progressed since Nov 2019. Stands all day with a lead vest on at work placing increased pressure through spine. discussed possible use of lumbar or thoracic brace. placed a 2-layer heel lift in Rt shoe due to notable LLD which pt reported made her feel like she "does not have to hobble" on the left leg. I asked her to wear the heel lift and remove if it increases her pain. Pt has decreased strength in Lt proximal musculature vs Rt. Encouraged her to stretch frequently through the day at work.    Personal Factors and Comorbidities  Comorbidity 2    Comorbidities  work demands, h/o spinal fusion, L1 retrolisthesis, h/o fall from horse    Examination-Activity Limitations  Sit;Sleep;Bend;Squat;Caring for Others;Stand;Carry;Toileting;Dressing;Lift    Examination-Participation Restrictions  Shop;Other;Meal Prep   work   Stability/Clinical Decision Making  Evolving/Moderate complexity    Clinical Decision Making  Moderate    Rehab Potential  Good    PT Frequency  1x / week    PT Duration  6 weeks    PT  Treatment/Interventions  ADLs/Self Care Home Management;Cryotherapy;Electrical Stimulation;Iontophoresis 14m/ml Dexamethasone;Moist Heat;Therapeutic activities;Gait training;Traction;Ultrasound;Therapeutic exercise;Neuromuscular re-education;Patient/family education;Taping;Dry needling;Passive range of motion;Manual techniques    PT Next Visit Plan  how did heel lift feel? hip abductor strengthening, did she try lumbar brace? lumbopelvic stability    PT Home Exercise Plan  seated hamstring stretch, seated figure 4    Consulted and Agree with Plan of Care  Patient       Patient will benefit from skilled therapeutic intervention in order to improve the following deficits and impairments:  Abnormal gait, Decreased activity tolerance, Decreased strength, Pain, Increased muscle spasms, Difficulty walking, Improper body mechanics, Impaired flexibility  Visit Diagnosis: 1. Pain in left hip   2. Difficulty in walking, not elsewhere classified        Problem List Patient Active Problem List   Diagnosis Date Noted  . Spinal stenosis 04/12/2019  .  Left hip pain 12/11/2018  . Statin intolerance 08/03/2018  . Pulmonary nodules 05/01/2018  . NAFLD (nonalcoholic fatty liver disease) 11/21/2017  . Elevated LFTs, fatty liver on [place] (09/28/17) 11/21/2017  . Barrett esophagus 09/01/2017  . Cerebral aneurysm 09/01/2017  . GERD (gastroesophageal reflux disease) 09/01/2017  . Hyperlipidemia 09/01/2017  . IBS (irritable bowel syndrome) 09/01/2017  . PUD (peptic ulcer disease) 09/01/2017  . Routine physical examination, 09/01/17 09/01/2017  . 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 Sacral fracture ([place]) 05/22/2017  . Chest pressure 01/10/2012  . Benign hypertension 01/10/2012   Theodore Virgin C. Ita Fritzsche PT, DPT 06/12/19 12:57 PM   [facility] [address] [place], [place], [address] Phone: [phone]   Fax:  [phone]  Name: EVONNE RINKS MRN:  [id number] Date of Birth: 12/08/1960

## 2019-06-17 MED FILL — valACYclovir HCL 1 GM TABS: 1 | 90 days supply | Qty: 90 | Fill #1

## 2019-06-17 MED FILL — PRALUENT 150 MG/ML PEN: 150 | 28 days supply | Qty: 2 | Fill #1

## 2019-06-20 ENCOUNTER — Ambulatory Visit
Admission: RE | Admit: 2019-06-20 | Discharge: 2019-06-20 | Disposition: A | Discharge status: Home / Self Care | Payer: 59 | Source: Ambulatory Visit | Attending: Family Medicine | Admitting: Family Medicine

## 2019-06-20 DIAGNOSIS — M47816 Spondylosis without myelopathy or radiculopathy, lumbar region: Secondary | ICD-10-CM | POA: Diagnosis not present

## 2019-06-20 DIAGNOSIS — M5416 Radiculopathy, lumbar region: Secondary | ICD-10-CM

## 2019-06-20 MED ORDER — DIAZEPAM 5 MG PO TABS
5.0000 mg | ORAL_TABLET | Freq: Once | ORAL | Status: AC
  Administered 2019-06-20: 5 mg via ORAL

## 2019-06-20 MED ORDER — METHYLPREDNISOLONE ACETATE 40 MG/ML INJ SUSP (RADIOLOG
120.0000 mg | Freq: Once | INTRAMUSCULAR | Status: AC
  Administered 2019-06-20: 120 mg via EPIDURAL

## 2019-06-20 MED ORDER — IOPAMIDOL (ISOVUE-M 200) INJECTION 41%
1.0000 mL | Freq: Once | INTRAMUSCULAR | Status: AC
  Administered 2019-06-20: 1 mL via EPIDURAL

## 2019-06-20 MED FILL — PANTOPRAZOLE SOD DR 40 MG T: 40 | 90 days supply | Qty: 90 | Fill #0

## 2019-06-20 NOTE — Discharge Instructions (Signed)

## 2019-06-24 MED FILL — traZODone HCL 50 MG TABS: 50 | 30 days supply | Qty: 30 | Fill #1

## 2019-06-28 ENCOUNTER — Encounter

## 2019-07-01 ENCOUNTER — Other Ambulatory Visit: Payer: Self-pay | Admitting: Family Medicine

## 2019-07-01 MED FILL — LOSARTAN POTASSIUM 100 MG T: 100 | 90 days supply | Qty: 90 | Fill #0

## 2019-07-01 MED FILL — HYDROCHLOROTHIAZIDE 25 MG T: 25 | 90 days supply | Qty: 90 | Fill #0

## 2019-07-09 ENCOUNTER — Ambulatory Visit: Payer: 59 | Attending: Family Medicine | Admitting: Physical Therapy

## 2019-07-09 ENCOUNTER — Ambulatory Visit: Payer: 59 | Admitting: Family Medicine

## 2019-07-09 ENCOUNTER — Other Ambulatory Visit: Payer: Self-pay

## 2019-07-09 ENCOUNTER — Encounter: Payer: Self-pay | Admitting: Family Medicine

## 2019-07-09 ENCOUNTER — Encounter: Payer: Self-pay | Admitting: Physical Therapy

## 2019-07-09 DIAGNOSIS — R262 Difficulty in walking, not elsewhere classified: Secondary | ICD-10-CM | POA: Insufficient documentation

## 2019-07-09 DIAGNOSIS — M25552 Pain in left hip: Secondary | ICD-10-CM | POA: Diagnosis not present

## 2019-07-09 DIAGNOSIS — M4807 Spinal stenosis, lumbosacral region: Secondary | ICD-10-CM

## 2019-07-09 NOTE — Therapy (Addendum)
[place], [place], [address] Phone: [phone]   Fax:  [phone]  Physical Therapy Treatment/Discharge  Patient Details  Name: [name] [name] MRN: [id number] Date of Birth: 1961/10/19 Referring Provider (PT): Hulan Saas, DO   Encounter Date: 07/09/2019  PT End of Session - 07/09/19 1100    Visit Number  2    Number of Visits  7    Date for PT Re-Evaluation  07/26/19    Authorization Type  [facility] [employer]    PT Start Time  1058    PT Stop Time  1143    PT Time Calculation (min)  45 min    Activity Tolerance  Patient tolerated treatment well    Behavior During Therapy  [facility] for tasks assessed/performed       Past Medical History:  Diagnosis Date  . Cerebral aneurysm 1999   Surgically coiled  . DDD (degenerative disc disease)   . GERD (gastroesophageal reflux disease)   . History of Barrett's esophagus   . Hypercholesterolemia   . Hypertension   . Irritable bowel disease   . Sinus disease     Past Surgical History:  Procedure Laterality Date  . ABDOMINAL HYSTERECTOMY  2005  . APPENDECTOMY    . ARTHROTOMY Right 12/11/2015   Procedure: OPEN ARTHROTOMY ;  Surgeon: Roseanne Kaufman, MD;  Location: [place];  Service: Orthopedics;  Laterality: Right;  . AUGMENTATION MAMMAPLASTY Bilateral   . BREAST ENHANCEMENT SURGERY    . CEREBRAL ANEURYSM REPAIR  1999  . DIAGNOSTIC LAPAROSCOPY    . LUMBAR DISC SURGERY Left 06/03/2010   Procedure: ANTEROLATERAL DECOMPRESSION AND ARTHRODESIS L3-4, L4-5  . NASAL SINUS SURGERY  2013  . ORIF ANKLE FRACTURE  03/01/2012   Procedure: OPEN REDUCTION INTERNAL FIXATION (ORIF) ANKLE FRACTURE;  Surgeon: Wylene Simmer, MD;  Location: [place];  Service: Orthopedics;  Laterality: Right;  Open reduction internal fixation right navicular fracture  . REFRACTIVE SURGERY    . SHOULDER ARTHROSCOPY Left   . SYNOVECTOMY Right 12/11/2015   Procedure:  SYNOVECTOMY DISTAL RADIOULNAR JOINT LOOSE BODY REMOVAL ;  Surgeon: Roseanne Kaufman, MD;  Location: [place];  Service: Orthopedics;  Laterality: Right;  . WRIST ARTHROSCOPY Right 12/11/2015   Procedure: RIGHT WRIST ARTHROSCOPY WITH DEBRIDEMENT ;  Surgeon: Roseanne Kaufman, MD;  Location: [place];  Service: Orthopedics;  Laterality: Right;    There were no vitals filed for this visit.  Subjective Assessment - 07/09/19 1103    Subjective  Last injection took me to about 98%, went down to 1 layer in heel lift since Right hip began to hurt.    Currently in Pain?  No/denies                       [facility] Adult PT Treatment/Exercise - 07/09/19 0001      Knee/Hip Exercises: Stretches   Piriformis Stretch Limitations  supine pull across      Knee/Hip Exercises: Standing   Other Standing Knee Exercises  antirotation green tband      Knee/Hip Exercises: Supine   Straight Leg Raises Limitations  with posterior pelvic tilt    Other Supine Knee/Hip Exercises  post pelvic tilt, pelvic tilt + march    Other Supine Knee/Hip Exercises  table top holds      Knee/Hip Exercises: Sidelying   Hip ABduction  20 reps;10 reps;Both      Knee/Hip Exercises: Prone  Other Prone Exercises  quadruped pelvic tilt & rocking      Manual Therapy   Manual Therapy  Soft tissue mobilization    Soft tissue mobilization  IASTM Lt VL               PT Short Term Goals - 07/09/19 1106      PT SHORT TERM GOAL #1   Title  at least 50% improvement in pain in ADLs & work activities    Status  Achieved        PT Long Term Goals - 06/12/19 1238      PT LONG TERM GOAL #1   Title  hip abductor strength 5/5    Baseline  4/5 at eval    Time  6    Period  Weeks    Status  New    Target Date  07/26/19      PT LONG TERM GOAL #2   Title  Able to walk for at least 30 min without limitation by hip pain    Baseline  2-3 min at eval    Time  6    Period  Weeks     Status  New    Target Date  07/26/19      PT LONG TERM GOAL #3   Title  will be indpendent in stretching regimen to do throughout the day at work for long term care    Baseline  will establish appropriate program    Time  6    Period  Weeks    Status  New    Target Date  07/26/19      PT LONG TERM GOAL #4   Title  FOTO to 38% limited    Baseline  54% limited at eval    Time  6    Period  Weeks    Status  New            Plan - 07/09/19 1145    Clinical Impression Statement  Progressed targeted strengthening exercises as she is feeling really good after the last injection. encouraged post pelvic tilt and avoid extension due ot stenosis.  No longer presenting with a limp. Tightness in proximal VL reduced with IASTM.    PT Treatment/Interventions  ADLs/Self Care Home Management;Cryotherapy;Electrical Stimulation;Iontophoresis 47m/ml Dexamethasone;Moist Heat;Therapeutic activities;Gait training;Traction;Ultrasound;Therapeutic exercise;Neuromuscular re-education;Patient/family education;Taping;Dry needling;Passive range of motion;Manual techniques    PT Home Exercise Plan  seated hamstring stretch, seated figure 4, antirotation, post pelvic tilt, TT holds, SL hip abd, qped pelvic tilt & rocking    Consulted and Agree with Plan of Care  Patient       Patient will benefit from skilled therapeutic intervention in order to improve the following deficits and impairments:  Abnormal gait, Decreased activity tolerance, Decreased strength, Pain, Increased muscle spasms, Difficulty walking, Improper body mechanics, Impaired flexibility  Visit Diagnosis: Pain in left hip  Difficulty in walking, not elsewhere classified     Problem List Patient Active Problem List   Diagnosis Date Noted  . Spinal stenosis 04/12/2019  . Left hip pain 12/11/2018  . Statin intolerance 08/03/2018  . Pulmonary nodules 05/01/2018  . NAFLD (nonalcoholic fatty liver disease) 11/21/2017  . Elevated LFTs,  fatty liver on [place](09/28/17) 11/21/2017  . Barrett esophagus 09/01/2017  . Cerebral aneurysm 09/01/2017  . GERD (gastroesophageal reflux disease) 09/01/2017  . Hyperlipidemia 09/01/2017  . IBS (irritable bowel syndrome) 09/01/2017  . PUD (peptic ulcer disease) 09/01/2017  . Routine physical examination, 09/01/17 09/01/2017  .  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 Sacral fracture ([place]) 05/22/2017  . Chest pressure 01/10/2012  . Benign hypertension 01/10/2012   Winton Offord C. Marynell Bies PT, DPT 07/09/19 12:06 PM   [facility] [address] [place], [place], [address] Phone: [phone]   Fax:  [phone]  Name: [name] [name] MRN: [id number] Date of Birth: 30-Mar-1961  PHYSICAL THERAPY DISCHARGE SUMMARY  Visits from Start of Care: 2  Current functional level related to goals / functional outcomes: See above   Remaining deficits: See above   Education / Equipment: Anatomy of condition, POC, HEP, exercise form/[place]  Plan: Patient agrees to discharge.  Patient goals were not met. Patient is being discharged due to a change in medical status.  ?????     Caila Cirelli C. Min Collymore PT, DPT 08/23/19 11:51 AM

## 2019-07-09 NOTE — Assessment & Plan Note (Signed)
Patient has made significant progress at this time.  We discussed the patient does have a spinal stenosis and may have increasing pain from time to time but has responded well to the epidural.  Encourage her to continue with the formal physical therapy.  Patient discussed the possibility of a heel lift which I did not think was significant.  Will consider only if correction.  Patient will continue with everything and call us when she needs [place].

## 2019-07-09 NOTE — Patient Instructions (Addendum)
Good to see you Keep up with the exercises Continue PT Call us when you need [place] [phone]

## 2019-07-09 NOTE — Progress Notes (Signed)
[name] [place], [place] [address] Phone: [phone] Subjective:   I [name] [name] am serving as a Education administrator for Dr. Hulan Saas.  I'm seeing this patient by the request  of:     CC: Back pain follow-up [id number]   05/29/2019 Patient does have a spinal stenosis and is having back surgery previously.  I do think that this was still what is contributing to most of her discomfort and pain.  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  I do think repeating this will be beneficial and restarting formal physical therapy.  Patient would like to do this plan.  Otherwise we will be sending patient to neurosurgery to discuss.  Update 07/09/2019 [name] [name] is a 58 y.o. female coming in with complaint of back pain. States that she got a lot more relief in comparison to her first epidural. Going to PT after this appointment and states that it is going well. No longer walking with a limp. States that she has a leg length discrepancy. Wearing a heel lift in right shoe. Since the epidural she states that she is relatively pain free. No pain in back and hip. Hip pops from time to time.  Patient feels that the epidural has made significant progress at this time.  The best she has been in years at the moment.  Still aware of it.      Past Medical History:  Diagnosis Date  . Cerebral aneurysm 1999   Surgically coiled  . DDD (degenerative disc disease)   . GERD (gastroesophageal reflux disease)   . History of Barrett's esophagus   . Hypercholesterolemia   . Hypertension   . Irritable bowel disease   . Sinus disease    Past Surgical History:  Procedure Laterality Date  . ABDOMINAL HYSTERECTOMY  2005  . APPENDECTOMY    . ARTHROTOMY Right 12/11/2015   Procedure: OPEN ARTHROTOMY ;  Surgeon: Roseanne Kaufman, MD;  Location: [place];  Service: Orthopedics;   Laterality: Right;  . AUGMENTATION MAMMAPLASTY Bilateral   . BREAST ENHANCEMENT SURGERY    . CEREBRAL ANEURYSM REPAIR  1999  . DIAGNOSTIC LAPAROSCOPY    . LUMBAR DISC SURGERY Left 06/03/2010   Procedure: ANTEROLATERAL DECOMPRESSION AND ARTHRODESIS L3-4, L4-5  . NASAL SINUS SURGERY  2013  . ORIF ANKLE FRACTURE  03/01/2012   Procedure: OPEN REDUCTION INTERNAL FIXATION (ORIF) ANKLE FRACTURE;  Surgeon: Wylene Simmer, MD;  Location: [place];  Service: Orthopedics;  Laterality: Right;  Open reduction internal fixation right navicular fracture  . REFRACTIVE SURGERY    . SHOULDER ARTHROSCOPY Left   . SYNOVECTOMY Right 12/11/2015   Procedure: SYNOVECTOMY DISTAL RADIOULNAR JOINT LOOSE BODY REMOVAL ;  Surgeon: Roseanne Kaufman, MD;  Location: [place];  Service: Orthopedics;  Laterality: Right;  . WRIST ARTHROSCOPY Right 12/11/2015   Procedure: RIGHT WRIST ARTHROSCOPY WITH DEBRIDEMENT ;  Surgeon: Roseanne Kaufman, MD;  Location: [place];  Service: Orthopedics;  Laterality: Right;   Social History   Socioeconomic History  . Marital status: Divorced    Spouse name: Not on file  . Number of children: Not on file  . Years of education: Not on file  . Highest education level: Not on file  Occupational History    Employer: [employer]  . Financial resource strain: Not on file  .  Food insecurity    Worry: Not on file    Inability: Not on file  . Transportation needs    Medical: Not on file    Non-medical: Not on file  Tobacco Use  . Smoking status: Former Smoker    Quit date: 11/14/1984    Years since quitting: 34.6  . Smokeless tobacco: Never Used  Substance and Sexual Activity  . Alcohol use: Yes    Comment: social  . Drug use: No  . Sexual activity: Not on file  Lifestyle  . Physical activity    Days per week: Not on file    Minutes per session: Not on file  . Stress: Not on file  Relationships  . Social Herbalist  on phone: Not on file    Gets together: Not on file    Attends religious service: Not on file    Active member of club or organization: Not on file    Attends meetings of clubs or organizations: Not on file    Relationship status: Not on file  Other Topics Concern  . Not on file  Social History Narrative   The patient works as a Marine scientist in the electrophysiology department. Her daughter and 81-monthold granddaughter live with her. She does not smoke cigarettes.   Allergies  Allergen Reactions  . Hydrocodone-Acetaminophen Itching  . Lipitor [Atorvastatin Calcium] Other (See Comments)    Elevated liver enzymes  . Morphine And Related Nausea Only  . Tetracyclines & Related Rash    Break out on all extremties   Family History  Problem Relation Age of Onset  . Hypertension Father   . Hypertension Mother   . Osteopenia Mother   . Osteopenia Maternal Grandmother      Current Outpatient Medications (Cardiovascular):  .Marland Kitchen Alirocumab (PRALUENT) 150 MG/ML SOPN, Inject 1 Dose into the skin every 14 (fourteen) days. .Marland Kitchen atenolol (TENORMIN) 50 MG tablet, TAKE 1 TABLET (50 MG TOTAL) BY MOUTH AT BEDTIME. .  hydrochlorothiazide (HYDRODIURIL) 25 MG tablet, TAKE 1 TABLET (25 MG TOTAL) BY MOUTH DAILY. .Marland Kitchen losartan (COZAAR) 100 MG tablet, Take 1 tablet (100 mg total) by mouth daily. .  Pitavastatin Calcium (LIVALO) 1 MG TABS, Take 1 tablet (1 mg total) by mouth daily.     Current Outpatient Medications (Other):  .Marland Kitchen Calcium Citrate-Vitamin D 315-250 MG-UNIT TABS, Take 1 tablet by mouth 2 (two) times daily. .  MULTIPLE VITAMINS-MINERALS PO, Take by mouth. .  Omega-3 Fatty Acids (FISH OIL) 1000 MG CAPS, Take 1,400 mg by mouth.  .  pantoprazole (PROTONIX) 40 MG tablet, TAKE 1 TABLET BY MOUTH DAILY. .  traZODone (DESYREL) 50 MG tablet, Take 0.5-1 tablets (25-50 mg total) by mouth at bedtime as needed for sleep. .  valACYclovir (VALTREX) 500 MG tablet, Take 500 mg by mouth 2 (two) times  daily.    Past medical history, social, surgical and family history all reviewed in electronic medical record.  No pertanent information unless stated regarding to the chief complaint.   Review of Systems:  No headache, visual changes, nausea, vomiting, diarrhea, constipation, dizziness, abdominal pain, skin rash, fevers, chills, night sweats, weight loss, swollen lymph nodes, body aches, joint swelling, chest pain, shortness of breath, mood changes.  Positive muscle aches  Objective  Blood pressure 140/70, pulse 77, height 5' 9"  (1.753 m), weight 172 lb (78 kg), SpO2 98 %.    General: No apparent distress alert and oriented x3 mood and affect normal, dressed appropriately.  HEENT: Pupils equal, extraocular movements intact  Respiratory: Patient's speak in full sentences and does not appear short of breath  Cardiovascular: No lower extremity edema, non tender, no erythema  Skin: Warm dry intact with no signs of infection or rash on extremities or on axial skeleton.  Abdomen: Soft nontender  Neuro: Cranial nerves II through XII are intact, neurovascularly intact in all extremities with 2+ DTRs and 2+ pulses.  Lymph: No lymphadenopathy of posterior or anterior cervical chain or axillae bilaterally.  Gait normal with good balance and coordination.  MSK:  Non tender with full range of motion and good stability and symmetric strength and tone of shoulders, elbows, wrist, hip, knee and ankles bilaterally.   Back exam still has some loss of lordosis.  Patient does have some tenderness to palpation in the paraspinal musculature.  Mild positive Corky Sox on the left side.  Negative straight leg test.  Worsening pain in the back with extension of lumbar spine.    Impression and Recommendations:     The above documentation has been reviewed and is accurate and complete Lyndal Pulley, DO       Note: This dictation was prepared with Dragon dictation along with smaller phrase technology. Any  transcriptional errors that result from this process are unintentional.

## 2019-07-12 MED FILL — PRALUENT 150 MG/ML SOAJ: 150 | 28 days supply | Qty: 2 | Fill #2

## 2019-07-16 MED FILL — ATENOLOL 50 MG TABLET: 50 | 90 days supply | Qty: 90 | Fill #0

## 2019-07-17 ENCOUNTER — Ambulatory Visit: Payer: 59 | Admitting: Physical Therapy

## 2019-07-17 DIAGNOSIS — Z20828 Contact with and (suspected) exposure to other viral communicable diseases: Secondary | ICD-10-CM | POA: Diagnosis not present

## 2019-07-18 MED FILL — traZODone HCL 50 MG TABS: 50 | 30 days supply | Qty: 30 | Fill #2

## 2019-07-25 ENCOUNTER — Ambulatory Visit: Payer: 59 | Admitting: Physical Therapy

## 2019-07-29 ENCOUNTER — Encounter (HOSPITAL_COMMUNITY): Payer: Self-pay | Admitting: Emergency Medicine

## 2019-07-29 ENCOUNTER — Ambulatory Visit (HOSPITAL_COMMUNITY)
Admission: EM | Admit: 2019-07-29 | Discharge: 2019-07-29 | Disposition: A | Discharge status: Home / Self Care | Payer: 59 | Attending: Family Medicine | Admitting: Family Medicine

## 2019-07-29 ENCOUNTER — Other Ambulatory Visit: Payer: Self-pay

## 2019-07-29 DIAGNOSIS — N3000 Acute cystitis without hematuria: Secondary | ICD-10-CM | POA: Insufficient documentation

## 2019-07-29 LAB — POCT URINALYSIS DIP (DEVICE)
Bilirubin Urine: NEGATIVE
Glucose, UA: NEGATIVE mg/dL
Hgb urine dipstick: NEGATIVE
Nitrite: POSITIVE — AB
Protein, ur: NEGATIVE mg/dL
Specific Gravity, Urine: >=1.03 (ref 1.005–1.030)
Urobilinogen, UA: 0.2 mg/dL (ref 0.0–1.0)
pH: 5.5 (ref 5.0–8.0)

## 2019-07-29 MED ORDER — NITROFURANTOIN MONOHYD MACRO 100 MG PO CAPS: 100.0000 mg | ORAL_CAPSULE | Freq: Two times a day (BID) | ORAL | 0 refills | Status: DC

## 2019-07-29 MED FILL — NITROFURANTOIN MONO-MCR 100: 100 | 5 days supply | Qty: 10 | Fill #0

## 2019-07-29 NOTE — ED Triage Notes (Signed)
Pt sts UTI sx x 1 week

## 2019-07-29 NOTE — ED Provider Notes (Signed)
[place]    CSN: [id number] Arrival date & time: 07/29/19  1451      History   Chief Complaint Chief Complaint  Patient presents with  . Urinary Tract Infection    HPI [name] [name] is a 58 y.o. female.   HPI  Has been here before for UTI Typical frequency, dysuria, urgency.  No abdominal pain or pelvic pain.  No vaginal discharge.  No flank pain.  No nausea or vomiting.  No fever or chills.  She has UTIs infrequently, maybe once every couple of years.  Past Medical History:  Diagnosis Date  . Cerebral aneurysm 1999   Surgically coiled  . DDD (degenerative disc disease)   . GERD (gastroesophageal reflux disease)   . History of Barrett's esophagus   . Hypercholesterolemia   . Hypertension   . Irritable bowel disease   . Sinus disease     Patient Active Problem List   Diagnosis Date Noted  . Spinal stenosis 04/12/2019  . Left hip pain 12/11/2018  . Statin intolerance 08/03/2018  . Pulmonary nodules 05/01/2018  . NAFLD (nonalcoholic fatty liver disease) 11/21/2017  . Elevated LFTs, fatty liver on [place] (09/28/17) 11/21/2017  . Barrett esophagus 09/01/2017  . Cerebral aneurysm 09/01/2017  . GERD (gastroesophageal reflux disease) 09/01/2017  . Hyperlipidemia 09/01/2017  . IBS (irritable bowel syndrome) 09/01/2017  . PUD (peptic ulcer disease) 09/01/2017  . Routine physical examination, 09/01/17 09/01/2017  . 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 Sacral fracture ([place]) 05/22/2017  . Chest pressure 01/10/2012  . Benign hypertension 01/10/2012    Past Surgical History:  Procedure Laterality Date  . ABDOMINAL HYSTERECTOMY  2005  . APPENDECTOMY    . ARTHROTOMY Right 12/11/2015   Procedure: OPEN ARTHROTOMY ;  Surgeon: Roseanne Kaufman, MD;  Location: [place];  Service: Orthopedics;  Laterality: Right;  . AUGMENTATION MAMMAPLASTY Bilateral   . BREAST ENHANCEMENT SURGERY    . CEREBRAL ANEURYSM REPAIR  1999  . DIAGNOSTIC LAPAROSCOPY    .  LUMBAR DISC SURGERY Left 06/03/2010   Procedure: ANTEROLATERAL DECOMPRESSION AND ARTHRODESIS L3-4, L4-5  . NASAL SINUS SURGERY  2013  . ORIF ANKLE FRACTURE  03/01/2012   Procedure: OPEN REDUCTION INTERNAL FIXATION (ORIF) ANKLE FRACTURE;  Surgeon: Wylene Simmer, MD;  Location: [place];  Service: Orthopedics;  Laterality: Right;  Open reduction internal fixation right navicular fracture  . REFRACTIVE SURGERY    . SHOULDER ARTHROSCOPY Left   . SYNOVECTOMY Right 12/11/2015   Procedure: SYNOVECTOMY DISTAL RADIOULNAR JOINT LOOSE BODY REMOVAL ;  Surgeon: Roseanne Kaufman, MD;  Location: [place];  Service: Orthopedics;  Laterality: Right;  . WRIST ARTHROSCOPY Right 12/11/2015   Procedure: RIGHT WRIST ARTHROSCOPY WITH DEBRIDEMENT ;  Surgeon: Roseanne Kaufman, MD;  Location: [place];  Service: Orthopedics;  Laterality: Right;    OB History   No obstetric history on file.      Home Medications    Prior to Admission medications   Medication Sig Start Date End Date Taking? Authorizing Provider  Alirocumab (PRALUENT) 150 MG/ML SOPN Inject 1 Dose into the skin every 14 (fourteen) days. 08/14/18   Pixie Casino, MD  atenolol (TENORMIN) 50 MG tablet TAKE 1 TABLET (50 MG TOTAL) BY MOUTH AT BEDTIME. 07/01/19   Briscoe Deutscher, DO  Calcium Citrate-Vitamin D 315-250 MG-UNIT TABS Take 1 tablet by mouth 2 (two) times daily.    [provider]  hydrochlorothiazide (HYDRODIURIL) 25 MG tablet TAKE 1 TABLET (  25 MG TOTAL) BY MOUTH DAILY. 12/10/18   Briscoe Deutscher, DO  losartan (COZAAR) 100 MG tablet Take 1 tablet (100 mg total) by mouth daily. 07/31/18   Hilty, Nadean Corwin, MD  MULTIPLE VITAMINS-MINERALS PO Take by mouth.    [provider]  nitrofurantoin, macrocrystal-monohydrate, (MACROBID) 100 MG capsule Take 1 capsule (100 mg total) by mouth 2 (two) times daily. 07/29/19   Raylene Everts, MD  Omega-3 Fatty Acids (FISH OIL) 1000 MG CAPS Take 1,400  mg by mouth.     [provider]  pantoprazole (PROTONIX) 40 MG tablet TAKE 1 TABLET BY MOUTH DAILY. 12/10/18   Briscoe Deutscher, DO  Pitavastatin Calcium (LIVALO) 1 MG TABS Take 1 tablet (1 mg total) by mouth daily. 12/19/18   Pixie Casino, MD  traZODone (DESYREL) 50 MG tablet Take 0.5-1 tablets (25-50 mg total) by mouth at bedtime as needed for sleep. 05/29/19   Lyndal Pulley, DO  valACYclovir (VALTREX) 500 MG tablet Take 500 mg by mouth 2 (two) times daily.    [provider]    Family History Family History  Problem Relation Age of Onset  . Hypertension Father   . Hypertension Mother   . Osteopenia Mother   . Osteopenia Maternal Grandmother     Social History Social History   Tobacco Use  . Smoking status: Former Smoker    Quit date: 11/14/1984    Years since quitting: 34.7  . Smokeless tobacco: Never Used  Substance Use Topics  . Alcohol use: Yes    Comment: social  . Drug use: No     Allergies   Hydrocodone-acetaminophen, Lipitor [atorvastatin calcium], Morphine and related, and Tetracyclines & related   Review of Systems Review of Systems  Constitutional: Negative for chills and fever.  HENT: Negative for ear pain and sore throat.   Eyes: Negative for pain and visual disturbance.  Respiratory: Negative for cough and shortness of breath.   Cardiovascular: Negative for chest pain and palpitations.  Gastrointestinal: Negative for abdominal pain and vomiting.  Genitourinary: Positive for dysuria and frequency. Negative for flank pain and hematuria.  Musculoskeletal: Negative for arthralgias and back pain.  Skin: Negative for color change and rash.  Neurological: Negative for seizures and syncope.  All other systems reviewed and are negative.    Physical Exam Triage Vital Signs ED Triage Vitals  Enc Vitals Group     BP 07/29/19 1525 135/86     Pulse Rate 07/29/19 1525 75     Resp 07/29/19 1525 18     Temp 07/29/19 1525 97.7 F (36.5 C)      Temp Source 07/29/19 1525 Oral     SpO2 07/29/19 1525 97 %     Weight --      Height --      Head Circumference --      Peak Flow --      Pain Score 07/29/19 1526 3     Pain Loc --      Pain Edu? --      Excl. in [place]? --    No data found.  Updated Vital Signs BP 135/86 (BP Location: Right Arm)   Pulse 75   Temp 97.7 F (36.5 C) (Oral)   Resp 18   SpO2 97%       Physical Exam Constitutional:      General: She is not in acute distress.    Appearance: She is well-developed.  HENT:     Head: Normocephalic and  atraumatic.  Eyes:     Conjunctiva/sclera: Conjunctivae normal.     Pupils: Pupils are equal, round, and reactive to light.  Neck:     Musculoskeletal: Normal range of motion.  Cardiovascular:     Rate and Rhythm: Normal rate.  Pulmonary:     Effort: Pulmonary effort is normal. No respiratory distress.  Abdominal:     General: There is no distension.     Palpations: Abdomen is soft.     Tenderness: There is no right CVA tenderness or left CVA tenderness.  Musculoskeletal: Normal range of motion.  Skin:    General: Skin is warm and dry.  Neurological:     Mental Status: She is alert.      UC Treatments / Results  Labs (all labs ordered are listed, but only abnormal results are displayed) Labs Reviewed  POCT URINALYSIS DIP (DEVICE) - Abnormal; Notable for the following components:      Result Value   Ketones, ur TRACE (*)    Nitrite POSITIVE (*)    Leukocytes,Ua TRACE (*)    All other components within normal limits  URINE CULTURE    EKG   Radiology No results found.  Procedures Procedures (including critical care time)  Medications Ordered in UC Medications - No data to display  Initial Impression / Assessment and Plan / UC Course  I have reviewed the triage vital signs and the nursing notes.  Pertinent labs & imaging results that were available during my care of the patient were reviewed by me and considered in my medical decision making  (see chart for details).     Dip urinalysis clearly indicates cystitis. Final Clinical Impressions(s) / UC Diagnoses   Final diagnoses:  Acute cystitis without hematuria     Discharge Instructions     DRINK MORE WATER Take the Macrodantin 2 times a day for 5 days We will call you if your culture is positive for a bacteria that would require antibiotic change    ED Prescriptions    Medication Sig Dispense Auth. Provider   nitrofurantoin, macrocrystal-monohydrate, (MACROBID) 100 MG capsule Take 1 capsule (100 mg total) by mouth 2 (two) times daily. 10 capsule Raylene Everts, MD     Controlled Substance Prescriptions [place] Controlled Substance Registry consulted? Not Applicable   Raylene Everts, MD 07/29/19 [phone]

## 2019-07-29 NOTE — Discharge Instructions (Addendum)
DRINK MORE WATER Take the Macrodantin 2 times a day for 5 days We will call you if your culture is positive for a bacteria that would require antibiotic change

## 2019-07-31 LAB — URINE CULTURE: Culture: >=100000 — AB

## 2019-08-02 ENCOUNTER — Telehealth (HOSPITAL_COMMUNITY): Payer: Self-pay | Admitting: Emergency Medicine

## 2019-08-02 NOTE — Telephone Encounter (Signed)
Urine culture was positive for e coli and was given  macrobid at urgent care visit. Pt contacted and made aware, educated on completing antibiotic and to follow up if symptoms are persistent. Verbalized understanding.

## 2019-08-06 ENCOUNTER — Telehealth (HOSPITAL_COMMUNITY): Payer: Self-pay | Admitting: Family Medicine

## 2019-08-06 MED ORDER — CEPHALEXIN 500 MG PO CAPS: 500.0000 mg | ORAL_CAPSULE | Freq: Two times a day (BID) | ORAL | 0 refills | Status: DC

## 2019-08-06 MED FILL — CEPHALEXIN 500 MG CAPSULE: 500 | 14 days supply | Qty: 28 | Fill #0

## 2019-08-06 NOTE — Telephone Encounter (Signed)
Sending keflex to the pharmacy. Recommend if treatment is not improved with this she will need to come in for recheck. Pt understanding and agreed.

## 2019-08-07 NOTE — Telephone Encounter (Signed)
LMTCB to discuss praluent & labs from July done by PCP

## 2019-08-12 ENCOUNTER — Other Ambulatory Visit: Payer: Self-pay | Admitting: Internal Medicine

## 2019-08-14 ENCOUNTER — Other Ambulatory Visit: Payer: Self-pay

## 2019-08-14 MED ORDER — PRALUENT 150 MG/ML ~~LOC~~ SOAJ: 150.0000 mg | SUBCUTANEOUS | 4 refills | Status: DC

## 2019-08-19 MED FILL — PRALUENT 150 MG/ML SOAJ: 150 | 28 days supply | Qty: 2 | Fill #0

## 2019-08-21 ENCOUNTER — Telehealth: Payer: Self-pay | Admitting: Internal Medicine

## 2019-08-21 NOTE — Telephone Encounter (Signed)
PA for praluent submitted by calling [phone] (medimpact) Response in 24-72 business hours

## 2019-08-21 NOTE — Telephone Encounter (Signed)
Patient returns call about Praluent and LDL results from July. She reports she is still taking [place] but her diet has not been as good. She also reports she has been trying to work on her diet and weight loss over the last 4-6 weeks. Advised will submit for PA with this info.

## 2019-08-29 NOTE — Telephone Encounter (Signed)
Med[employer] has approved coverage of Praluent 123m/mL from 08/23/2019 - 08/21/2020 for a maximum of 12 fills

## 2019-09-05 MED FILL — valACYclovir HCL 1 GM TABS: 1 | 90 days supply | Qty: 90 | Fill #2

## 2019-09-05 MED FILL — traZODone HCL 50 MG TABS: 50 | 30 days supply | Qty: 30 | Fill #3

## 2019-09-09 ENCOUNTER — Other Ambulatory Visit: Payer: Self-pay | Admitting: Internal Medicine

## 2019-09-09 MED FILL — PANTOPRAZOLE SOD DR 40 MG T: 40 | 90 days supply | Qty: 90 | Fill #1

## 2019-09-09 MED FILL — LIVALO 1 MG TABLET: 1 | 90 days supply | Qty: 90 | Fill #1

## 2019-09-10 MED FILL — PRALUENT 150 MG/ML SOAJ: 150 | 28 days supply | Qty: 2 | Fill #1

## 2019-09-11 MED FILL — LOSARTAN POTASSIUM 100 MG T: 100 | 90 days supply | Qty: 90 | Fill #0

## 2019-09-11 MED FILL — HYDROCHLOROTHIAZIDE 25 MG T: 25 | 90 days supply | Qty: 90 | Fill #1

## 2019-09-25 ENCOUNTER — Ambulatory Visit (INDEPENDENT_AMBULATORY_CARE_PROVIDER_SITE_OTHER)
Admission: RE | Admit: 2019-09-25 | Discharge: 2019-09-25 | Disposition: A | Discharge status: Home / Self Care | Payer: 59 | Source: Ambulatory Visit | Attending: Family Medicine | Admitting: Family Medicine

## 2019-09-25 ENCOUNTER — Ambulatory Visit: Payer: 59 | Admitting: Family Medicine

## 2019-09-25 ENCOUNTER — Ambulatory Visit: Payer: Self-pay

## 2019-09-25 ENCOUNTER — Other Ambulatory Visit: Payer: Self-pay | Admitting: Family Medicine

## 2019-09-25 ENCOUNTER — Other Ambulatory Visit: Payer: Self-pay

## 2019-09-25 ENCOUNTER — Encounter: Payer: Self-pay | Admitting: Family Medicine

## 2019-09-25 VITALS — BP 130/80 | HR 79 | Ht 69.0 in | Wt 170.0 lb

## 2019-09-25 DIAGNOSIS — M2242 Chondromalacia patellae, left knee: Secondary | ICD-10-CM | POA: Diagnosis not present

## 2019-09-25 DIAGNOSIS — G8929 Other chronic pain: Secondary | ICD-10-CM | POA: Diagnosis not present

## 2019-09-25 DIAGNOSIS — M25562 Pain in left knee: Secondary | ICD-10-CM

## 2019-09-25 DIAGNOSIS — M25462 Effusion, left knee: Secondary | ICD-10-CM | POA: Diagnosis not present

## 2019-09-25 NOTE — Patient Instructions (Signed)
Good to see you.  Ice 20 minutes 2 times daily. Usually after activity and before bed. Exercises 3 times a week.  Xray downstairs Tart cherry extract 1247m at night See me again in 5-6 weeks if no better we will inject

## 2019-09-25 NOTE — Assessment & Plan Note (Signed)
Moderate to severe.  X-rays pending, concern for gout or pseudogout when looked on the ultrasound.  Discussed with patient about icing regimen and home exercises.  Patient given a hinged brace because she would not fit a Tru pull lite.  Follow-up with me again in 4 to 6 weeks worsening pain consider injection and formal physical therapy.

## 2019-09-25 NOTE — Progress Notes (Signed)
[name] [place], [place] [address] Phone: [phone] Subjective:   I [name] am serving as a Education administrator for Dr. Hulan Saas.   CC: Left knee pain  [id number]  [name] [name] is a 58 y.o. female coming in with complaint of left pain. States she has been having issues with her knees. Left. Pain radiates distally into the lower leg.   Onset- chronic Location - quad tendon, patellofemoral  Duration-  Character- achy  Aggravating factors- flexion, squatting  Reliving factors-  Therapies tried- ice  Severity-  5/10 at its worse      Past Medical History:  Diagnosis Date  . Cerebral aneurysm 1999   Surgically coiled  . DDD (degenerative disc disease)   . GERD (gastroesophageal reflux disease)   . History of Barrett's esophagus   . Hypercholesterolemia   . Hypertension   . Irritable bowel disease   . Sinus disease    Past Surgical History:  Procedure Laterality Date  . ABDOMINAL HYSTERECTOMY  2005  . APPENDECTOMY    . ARTHROTOMY Right 12/11/2015   Procedure: OPEN ARTHROTOMY ;  Surgeon: Roseanne Kaufman, MD;  Location: [facility];  Service: Orthopedics;  Laterality: Right;  . AUGMENTATION MAMMAPLASTY Bilateral   . BREAST ENHANCEMENT SURGERY    . CEREBRAL ANEURYSM REPAIR  1999  . DIAGNOSTIC LAPAROSCOPY    . LUMBAR DISC SURGERY Left 06/03/2010   Procedure: ANTEROLATERAL DECOMPRESSION AND ARTHRODESIS L3-4, L4-5  . NASAL SINUS SURGERY  2013  . ORIF ANKLE FRACTURE  03/01/2012   Procedure: OPEN REDUCTION INTERNAL FIXATION (ORIF) ANKLE FRACTURE;  Surgeon: Wylene Simmer, MD;  Location: [facility];  Service: Orthopedics;  Laterality: Right;  Open reduction internal fixation right navicular fracture  . REFRACTIVE SURGERY    . SHOULDER ARTHROSCOPY Left   . SYNOVECTOMY Right 12/11/2015   Procedure: SYNOVECTOMY DISTAL RADIOULNAR JOINT LOOSE BODY REMOVAL ;  Surgeon: Roseanne Kaufman, MD;  Location: [place];  Service: Orthopedics;  Laterality: Right;  . WRIST ARTHROSCOPY Right 12/11/2015   Procedure: RIGHT WRIST ARTHROSCOPY WITH DEBRIDEMENT ;  Surgeon: Roseanne Kaufman, MD;  Location: [place] [facility];  Service: Orthopedics;  Laterality: Right;   Social History   Socioeconomic History  . Marital status: Divorced    Spouse name: Not on file  . Number of children: Not on file  . Years of education: Not on file  . Highest education level: Not on file  Occupational History    Employer: [employer]  . Financial resource strain: Not on file  . Food insecurity    Worry: Not on file    Inability: Not on file  . Transportation needs    Medical: Not on file    Non-medical: Not on file  Tobacco Use  . Smoking status: Former Smoker    Quit date: 11/14/1984    Years since quitting: 34.8  . Smokeless tobacco: Never Used  Substance and Sexual Activity  . Alcohol use: Yes    Comment: social  . Drug use: No  . Sexual activity: Not on file  Lifestyle  . Physical activity    Days per week: Not on file    Minutes per session: Not on file  . Stress: Not on file  Relationships  . Social Herbalist on phone: Not on file    Gets together: Not on file    Attends religious service: Not on file  Active member of club or organization: Not on file    Attends meetings of clubs or organizations: Not on file    Relationship status: Not on file  Other Topics Concern  . Not on file  Social History Narrative   The patient works as a Marine scientist in the electrophysiology department. Her daughter and 61-monthold granddaughter live with her. She does not smoke cigarettes.   Allergies  Allergen Reactions  . Hydrocodone-Acetaminophen Itching  . Lipitor [Atorvastatin Calcium] Other (See Comments)    Elevated liver enzymes  . Morphine And Related Nausea Only  . Tetracyclines & Related Rash    Break out on all extremties   Family History  Problem Relation Age of  Onset  . Hypertension Father   . Hypertension Mother   . Osteopenia Mother   . Osteopenia Maternal Grandmother      Current Outpatient Medications (Cardiovascular):  .Marland Kitchen Alirocumab (PRALUENT) 150 MG/ML SOAJ, Inject 150 mg as directed every 14 (fourteen) days. .Marland Kitchen atenolol (TENORMIN) 50 MG tablet, TAKE 1 TABLET (50 MG TOTAL) BY MOUTH AT BEDTIME. .  hydrochlorothiazide (HYDRODIURIL) 25 MG tablet, TAKE 1 TABLET (25 MG TOTAL) BY MOUTH DAILY. .Marland Kitchen losartan (COZAAR) 100 MG tablet, TAKE 1 TABLET BY MOUTH DAILY. .Marland Kitchen Pitavastatin Calcium (LIVALO) 1 MG TABS, Take 1 tablet (1 mg total) by mouth daily.     Current Outpatient Medications (Other):  .Marland Kitchen Calcium Citrate-Vitamin D 315-250 MG-UNIT TABS, Take 1 tablet by mouth 2 (two) times daily. .  cephALEXin (KEFLEX) 500 MG capsule, Take 1 capsule (500 mg total) by mouth 2 (two) times daily. .  MULTIPLE VITAMINS-MINERALS PO, Take by mouth. .  nitrofurantoin, macrocrystal-monohydrate, (MACROBID) 100 MG capsule, Take 1 capsule (100 mg total) by mouth 2 (two) times daily. .  Omega-3 Fatty Acids (FISH OIL) 1000 MG CAPS, Take 1,400 mg by mouth.  .  pantoprazole (PROTONIX) 40 MG tablet, TAKE 1 TABLET BY MOUTH DAILY. .  traZODone (DESYREL) 50 MG tablet, Take 0.5-1 tablets (25-50 mg total) by mouth at bedtime as needed for sleep. .  valACYclovir (VALTREX) 500 MG tablet, Take 500 mg by mouth 2 (two) times daily.    Past medical history, social, surgical and family history all reviewed in electronic medical record.  No pertanent information unless stated regarding to the chief complaint.   Review of Systems:  No headache, visual changes, nausea, vomiting, diarrhea, constipation, dizziness, abdominal pain, skin rash, fevers, chills, night sweats, weight loss, swollen lymph nodes, body aches, joint swelling,  chest pain, shortness of breath, mood changes.  Positive muscle aches  Objective  Blood pressure 130/80, pulse 79, height 5' 9"  (1.753 m), weight 170 lb  (77.1 kg), SpO2 98 %.    General: No apparent distress alert and oriented x3 mood and affect normal, dressed appropriately.  HEENT: Pupils equal, extraocular movements intact  Respiratory: Patient's speak in full sentences and does not appear short of breath  Cardiovascular: No lower extremity edema, non tender, no erythema  Skin: Warm dry intact with no signs of infection or rash on extremities or on axial skeleton.  Abdomen: Soft nontender  Neuro: Cranial nerves II through XII are intact, neurovascularly intact in all extremities with 2+ DTRs and 2+ pulses.  Lymph: No lymphadenopathy of posterior or anterior cervical chain or axillae bilaterally.  Gait normal with good balance and coordination.  MSK:  Non tender with full range of motion and good stability and symmetric strength and tone of shoulders, elbows, wrist,, and ankles  bilaterally.  Still mild limited range of motion of the left hip  Left knee exam shows the patient does have tenderness to palpation around the patellofemoral joint.  Positive patella grind noted.  Mild lateral tracking noted.  Mild pain over the medial joint line but no significant instability of the knee itself.  Limited musculoskeletal ultrasound was performed and interpreted by Lyndal Pulley  Limited ultrasound of patient's left knee shows the patient does have mild to moderate patellofemoral narrowing noted.  Mild loose body formation noted.  Patient does have calcific changes of the meniscus medial and lateral that appear to be gout versus pseudogout.  [id number]; 15 additional minutes spent for Therapeutic exercises as stated in above notes.  This included exercises focusing on stretching, strengthening, with significant focus on eccentric aspects.   Long term goals include an improvement in range of motion, strength, endurance as well as avoiding reinjury. Patient's frequency would include in 1-2 times a day, 3-5 times a week for a duration of 6-12 weeks.  Reviewed  anatomy using anatomical model and how PFS occurs.  Given rehab exercises handout for VMO, hip abductors, core, entire kinetic chain including proprioception exercises.  Could benefit from PT, regular exercise, upright biking, and a PFS knee brace to assist with tracking abnormalities. Proper technique shown and discussed handout in great detail with ATC.  All questions were discussed and answered.     Impression and Recommendations:     This case required medical decision making of moderate complexity. The above documentation has been reviewed and is accurate and complete Lyndal Pulley, DO       Note: This dictation was prepared with Dragon dictation along with smaller phrase technology. Any transcriptional errors that result from this process are unintentional.

## 2019-11-04 MED FILL — PRALUENT 150 MG/ML SOAJ: 150 | 28 days supply | Qty: 2 | Fill #3

## 2019-11-11 MED FILL — ATENOLOL 50 MG TABLET: 50 | 90 days supply | Qty: 90 | Fill #1

## 2019-12-10 MED FILL — PRALUENT 150 MG/ML SOAJ: 150 | 28 days supply | Qty: 2 | Fill #4

## 2019-12-25 ENCOUNTER — Other Ambulatory Visit: Payer: Self-pay

## 2019-12-25 ENCOUNTER — Encounter: Payer: Self-pay | Admitting: Internal Medicine

## 2019-12-25 ENCOUNTER — Ambulatory Visit (INDEPENDENT_AMBULATORY_CARE_PROVIDER_SITE_OTHER): Payer: 59 | Admitting: Internal Medicine

## 2019-12-25 ENCOUNTER — Telehealth: Payer: Self-pay

## 2019-12-25 VITALS — BP 106/73 | HR 70 | Temp 97.3°F | Ht 69.0 in | Wt 169.0 lb

## 2019-12-25 DIAGNOSIS — Z789 Other specified health status: Secondary | ICD-10-CM | POA: Diagnosis not present

## 2019-12-25 DIAGNOSIS — E782 Mixed hyperlipidemia: Secondary | ICD-10-CM | POA: Diagnosis not present

## 2019-12-25 DIAGNOSIS — R931 Abnormal findings on diagnostic imaging of heart and coronary circulation: Secondary | ICD-10-CM

## 2019-12-25 DIAGNOSIS — K760 Fatty (change of) liver, not elsewhere classified: Secondary | ICD-10-CM | POA: Diagnosis not present

## 2019-12-25 MED ORDER — PANTOPRAZOLE SODIUM 40 MG PO TBEC: 40.0000 mg | DELAYED_RELEASE_TABLET | Freq: Every day | ORAL | 3 refills | Status: DC

## 2019-12-25 MED ORDER — PRALUENT 150 MG/ML ~~LOC~~ SOAJ: 150.0000 mg | SUBCUTANEOUS | 11 refills | Status: DC

## 2019-12-25 MED ORDER — LIVALO 1 MG PO TABS: 1.0000 mg | ORAL_TABLET | Freq: Every day | ORAL | 3 refills | Status: DC

## 2019-12-25 MED FILL — PANTOPRAZOLE SOD DR 40 MG T: 40 | 90 days supply | Qty: 90 | Fill #0

## 2019-12-25 MED FILL — LIVALO 1 MG TABLET: 1 | 90 days supply | Qty: 90 | Fill #0

## 2019-12-25 NOTE — Progress Notes (Signed)
OFFICE NOTE  Chief Complaint:  Follow-up dyslipidemia  Primary Care Physician: Briscoe Deutscher, DO  HPI:  [name] [name] is a 59 y.o. female with a past medial history significant for cerebral aneurysm, hypertension, dyslipidemia and family history of hypertension and dyslipidemia, but no significant early onset heart disease.  She was noted to have dyslipidemia in the past and this was monitored.  When she changed primary care providers she was started on statin therapy which caused a marked reduction in cholesterol.  The time this was atorvastatin 10 mg, however she had significant elevation of liver enzymes.  An abdominal ultrasound in 2018 demonstrated fatty liver disease is the likely cause of this.  After stopping the statin therapy her liver enzymes returned to baseline which was borderline abnormal.  She was seen by Tana Coast, PharmD, for evaluation of her dyslipidemia as a new patient. Georgina Peer recommended trying Livalo 1 mg daily.  [name] [name] reports compliance with this medication and had liver enzyme testing 2 months ago which indicated stable liver enzymes.  I suspect she is tolerating this well as the statin is predominantly glucuronidated.  However, it is a low to moderate intensity statin at various doses and may not reduce her cholesterol significantly.  We further discussed her diet today which she felt was very healthy.  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  She reports a very deep knowledge of healthy diet.  There is no documented ASCVD.  05/01/2018  [name] [name] returns today for follow-up.  She has aggressively tried to alter her diet as well as maintain compliance with Livalo 1 mg daily.  We repeated recent lab work which indicates reduction in cholesterol the 271, triglycerides were 154 (reduced from 290), HDL increased from 45-73 and calculated LDL  was 167 (up from 146).  This does represent an increase LDL, however she has had particle shifting.  Liver enzymes have remained stable he mild elevated less than 2 times the upper limit of normal.  She underwent coronary artery calcium scoring, which demonstrated a calcium score of 6.  There was some scattered sub-pulmonary nodules and I would recommend a repeat CT scan without contrast in 1 year.  07/31/2018  [name] [name] is seen today in follow-up.  Overall she seems to be doing well.  I increased her Livalo to 2 mg daily.  This was associated with an increase in liver enzymes which was significant.  Her AST went from 21-67 and ALT from 26-138.  Again, she remains asymptomatic with this, however I have some concern given her history of underlying liver disease whether she will be tolerating this increase.  It is seems that she will not.   12/19/2018  [name] [name] returns today for follow-up.  Overall she has been doing well and tolerating Praluent.  We had to reduce the dose of her Livalo due to elevated liver enzymes.  Her most recent liver enzymes however have normalized with AST of 33 and ALT of 23.  Her cholesterol has also dropped significantly on Praluent.  Her total cholesterol is 132, triglycerides 173 (decreased from 220), HDL 57 and LDL 40 (decreased from 167).  She is tolerating the medication without any myalgias or other side effects.  12/24/2018  [name] [name] is seen today for annual follow-up.  She continues to do fairly well.  She struggles with low back pain and  some issues related to that.  She has been less physically active due to that.  Over the summer her cholesterol had gone up.  Her total was 199, triglycerides 117, HDL 55 and LDL 91 (increased from 40).  She reports compliance with her Praluent and Livalo.  She is due for refills.  She has had repeat prior authorization for the medication.   PMHx:  Past Medical History:  Diagnosis Date  . Cerebral aneurysm 1999   Surgically coiled   . DDD (degenerative disc disease)   . GERD (gastroesophageal reflux disease)   . History of Barrett's esophagus   . Hypercholesterolemia   . Hypertension   . Irritable bowel disease   . Sinus disease     Past Surgical History:  Procedure Laterality Date  . ABDOMINAL HYSTERECTOMY  2005  . APPENDECTOMY    . ARTHROTOMY Right 12/11/2015   Procedure: OPEN ARTHROTOMY ;  Surgeon: Roseanne Kaufman, MD;  Location: [facility];  Service: Orthopedics;  Laterality: Right;  . AUGMENTATION MAMMAPLASTY Bilateral   . BREAST ENHANCEMENT SURGERY    . CEREBRAL ANEURYSM REPAIR  1999  . DIAGNOSTIC LAPAROSCOPY    . LUMBAR DISC SURGERY Left 06/03/2010   Procedure: ANTEROLATERAL DECOMPRESSION AND ARTHRODESIS L3-4, L4-5  . NASAL SINUS SURGERY  2013  . ORIF ANKLE FRACTURE  03/01/2012   Procedure: OPEN REDUCTION INTERNAL FIXATION (ORIF) ANKLE FRACTURE;  Surgeon: Wylene Simmer, MD;  Location: [facility];  Service: Orthopedics;  Laterality: Right;  Open reduction internal fixation right navicular fracture  . REFRACTIVE SURGERY    . SHOULDER ARTHROSCOPY Left   . SYNOVECTOMY Right 12/11/2015   Procedure: SYNOVECTOMY DISTAL RADIOULNAR JOINT LOOSE BODY REMOVAL ;  Surgeon: Roseanne Kaufman, MD;  Location: [facility];  Service: Orthopedics;  Laterality: Right;  . WRIST ARTHROSCOPY Right 12/11/2015   Procedure: RIGHT WRIST ARTHROSCOPY WITH DEBRIDEMENT ;  Surgeon: Roseanne Kaufman, MD;  Location: [facility];  Service: Orthopedics;  Laterality: Right;    FAMHx:  Family History  Problem Relation Age of Onset  . Hypertension Father   . Hypertension Mother   . Osteopenia Mother   . Osteopenia Maternal Grandmother     SOCHx:   reports that she quit smoking about 35 years ago. She has never used smokeless tobacco. She reports current alcohol use. She reports that she does not use drugs.  ALLERGIES:  Allergies  Allergen Reactions  . Hydrocodone-Acetaminophen  Itching  . Lipitor [Atorvastatin Calcium] Other (See Comments)    Elevated liver enzymes  . Morphine And Related Nausea Only  . Tetracyclines & Related Rash    Break out on all extremties    ROS: Pertinent items noted in HPI and remainder of comprehensive ROS otherwise negative.  HOME MEDS: Current Outpatient Medications on File Prior to Visit  Medication Sig Dispense Refill  . Alirocumab (PRALUENT) 150 MG/ML SOAJ Inject 150 mg as directed every 14 (fourteen) days. 2 pen 4  . atenolol (TENORMIN) 50 MG tablet TAKE 1 TABLET (50 MG TOTAL) BY MOUTH AT BEDTIME. 90 tablet 1  . Calcium Citrate-Vitamin D 315-250 MG-UNIT TABS Take 1 tablet by mouth 2 (two) times daily.    . hydrochlorothiazide (HYDRODIURIL) 25 MG tablet TAKE 1 TABLET (25 MG TOTAL) BY MOUTH DAILY. 90 tablet 3  . losartan (COZAAR) 100 MG tablet TAKE 1 TABLET BY MOUTH DAILY. 90 tablet 3  . MULTIPLE VITAMINS-MINERALS PO Take by mouth.    . Omega-3 Fatty Acids (FISH OIL) 1000 MG  CAPS Take 1,400 mg by mouth.     . pantoprazole (PROTONIX) 40 MG tablet TAKE 1 TABLET BY MOUTH DAILY. 90 tablet 3  . Pitavastatin Calcium (LIVALO) 1 MG TABS Take 1 tablet (1 mg total) by mouth daily. 90 tablet 3  . valACYclovir (VALTREX) 500 MG tablet Take 500 mg by mouth 2 (two) times daily.    . cephALEXin (KEFLEX) 500 MG capsule Take 1 capsule (500 mg total) by mouth 2 (two) times daily. (Patient not taking: Reported on 12/25/2019) 28 capsule 0  . nitrofurantoin, macrocrystal-monohydrate, (MACROBID) 100 MG capsule Take 1 capsule (100 mg total) by mouth 2 (two) times daily. (Patient not taking: Reported on 12/25/2019) 10 capsule 0  . traZODone (DESYREL) 50 MG tablet TAKE 1/2 TO 1 TABLET BY MOUTH AT BEDTIME AS NEEDED FOR SLEEP. (Patient not taking: Reported on 12/25/2019) 30 tablet 3   No current facility-administered medications on file prior to visit.    LABS/IMAGING: No results found for this or any previous visit (from the past 48 hour(s)). No results  found.  LIPID PANEL:    Component Value Date/Time   CHOL 199 05/27/2019 1118   CHOL 132 12/11/2018 1057   TRIG 117.0 05/27/2019 1118   HDL 85.10 05/27/2019 1118   HDL 57 12/11/2018 1057   CHOLHDL 2 05/27/2019 1118   VLDL 23.4 05/27/2019 1118   LDLCALC 91 05/27/2019 1118   LDLCALC 40 12/11/2018 1057   LDLDIRECT 146.0 11/21/2017 0913     WEIGHTS: Wt Readings from Last 3 Encounters:  12/25/19 169 lb (76.7 kg)  09/25/19 170 lb (77.1 kg)  07/09/19 172 lb (78 kg)    VITALS: BP 106/73   Pulse 70   Temp (!) 97.3 F (36.3 C)   Ht 5' 9"  (1.753 m)   Wt 169 lb (76.7 kg)   SpO2 98%   BMI 24.96 kg/m   EXAM: General appearance: alert and no distress Lungs: clear to auscultation bilaterally Heart: regular rate and rhythm Extremities: extremities normal, atraumatic, no cyanosis or edema Skin: Skin color, texture, turgor normal. No rashes or lesions Neurologic: Grossly normal Psych: Pleasant  EKG: Deferred  ASSESSMENT: 1. Dyslipidemia -low 10-year risk of 5% 2. CAC of 6 (03/2018) 3. Family history of dyslipidemia and hypertension 4. Hypertension-controlled 5. NAFLD 6. Subcentimeter pulmonary nodules  PLAN: 1.   [name] [name] is doing well although had interval increase in her cholesterol over the summer.  This is likely due to less activity and dietary changes.  Since then she has had some improvement in her back/hip pain and is more active.  We will plan a repeat fasting lipid profile today.  Renew her medications and adjust accordingly to maintain LDL less than 70.  Follow-up with me annually or sooner as necessary.  Pixie Casino, MD, [facility], [place] Director of the Advanced Lipid Disorders &  Cardiovascular Risk Reduction Clinic Diplomate of the American Board of Clinical Lipidology Attending Cardiologist  Direct Dial: [phone]  Fax: [phone]  Website:  [email or web address] 12/25/2019, 10:47 AM

## 2019-12-25 NOTE — Telephone Encounter (Signed)
MEDICATION:pantoprazole (PROTONIX) 40 MG tablet ,hydrochlorothiazide (HYDRODIURIL) 25 MG tabletand and atenolol (TENORMIN) 50 MG tablet  PHARMACY: [place], [place] - [address] [facility] Phone:  [phone]  Fax:  [phone]       Comments: TOC appt 05/27/20  **Let patient know to contact pharmacy at the end of the day to make sure medication is ready. **  ** Please notify patient to allow 48-72 hours to process**  **Encourage patient to contact the pharmacy for refills or they can request refills through [facility]**

## 2019-12-25 NOTE — Telephone Encounter (Signed)
Sent to [facility].

## 2019-12-25 NOTE — Patient Instructions (Signed)
Medication Instructions:  Your physician recommends that you continue on your current medications as directed. Please refer to the Current Medication list given to you today.  *If you need a refill on your cardiac medications before your next appointment, please call your pharmacy*  Lab Work: FASTING lab work today   If you have labs (blood work) drawn today and your tests are completely normal, you will receive your results only by: Marland Kitchen MyChart Message (if you have MyChart) OR . A paper copy in the mail If you have any lab test that is abnormal or we need to change your treatment, we will call you to review the results.  Testing/Procedures: NONE  Follow-Up: At [facility], you and your health needs are our priority.  As part of our continuing mission to provide you with exceptional heart care, we have created designated Provider Care Teams.  These Care Teams include your primary Cardiologist (physician) and Advanced Practice Providers (APPs -  Physician Assistants and Nurse Practitioners) who all work together to provide you with the care you need, when you need it.  Your next appointment:   12 month(s) - lipid clinic  The format for your next appointment:   Either In Person or Virtual  Provider:   K. Mali Hilty, MD  Other Instructions  Orma Flaming, MD [place] at [facility] [address] . [place], [place]: [phone] .  Hours (M-F): 7am - 5pm

## 2019-12-26 LAB — LIPID PANEL
Chol/HDL Ratio: 1.9 ratio (ref 0.0–4.4)
Cholesterol, Total: 112 mg/dL (ref 100–199)
HDL: 59 mg/dL (ref >39)
LDL Chol Calc (NIH): 36 mg/dL (ref 0–99)
Triglycerides: 91 mg/dL (ref 0–149)
VLDL Cholesterol Cal: 17 mg/dL (ref 5–40)

## 2019-12-30 ENCOUNTER — Other Ambulatory Visit: Payer: Self-pay | Admitting: Family Medicine

## 2019-12-30 MED FILL — LOSARTAN POTASSIUM 100 MG T: 100 | 90 days supply | Qty: 90 | Fill #1

## 2019-12-31 ENCOUNTER — Other Ambulatory Visit: Payer: Self-pay

## 2019-12-31 MED ORDER — HYDROCHLOROTHIAZIDE 25 MG PO TABS: 25.0000 mg | ORAL_TABLET | Freq: Every day | ORAL | 2 refills | Status: DC

## 2019-12-31 MED FILL — HYDROCHLOROTHIAZIDE 25 MG T: 25 | 90 days supply | Qty: 90 | Fill #0

## 2019-12-31 NOTE — Progress Notes (Signed)
Sent to pharmacy 

## 2020-01-01 MED FILL — PRALUENT 150 MG/ML SOAJ: 150 | 28 days supply | Qty: 2 | Fill #0

## 2020-01-28 MED FILL — PRALUENT 150 MG/ML SOAJ: 150 | 28 days supply | Qty: 2 | Fill #1

## 2020-02-18 ENCOUNTER — Telehealth: Payer: Self-pay | Admitting: Family Medicine

## 2020-02-18 ENCOUNTER — Other Ambulatory Visit: Payer: Self-pay | Admitting: Family Medicine

## 2020-02-18 NOTE — Telephone Encounter (Signed)
MEDICATION: Atenolol (Tenormin) 50 MG tablet  PHARMACY: [place] [address]  Comments:   **Let patient know to contact pharmacy at the end of the day to make sure medication is ready. **  ** Please notify patient to allow 48-72 hours to process**  **Encourage patient to contact the pharmacy for refills or they can request refills through [facility]**

## 2020-02-19 ENCOUNTER — Other Ambulatory Visit: Payer: Self-pay

## 2020-02-19 MED ORDER — ATENOLOL 50 MG PO TABS: 50.0000 mg | ORAL_TABLET | Freq: Every day | ORAL | 0 refills | Status: DC

## 2020-02-19 MED FILL — ATENOLOL 50 MG TABLET: 50 | 90 days supply | Qty: 90 | Fill #0

## 2020-02-26 ENCOUNTER — Other Ambulatory Visit: Payer: Self-pay

## 2020-02-26 ENCOUNTER — Encounter: Payer: Self-pay | Admitting: Family Medicine

## 2020-02-26 ENCOUNTER — Ambulatory Visit: Payer: 59 | Admitting: Family Medicine

## 2020-02-26 VITALS — BP 110/86 | HR 86 | Ht 69.0 in | Wt 169.0 lb

## 2020-02-26 DIAGNOSIS — M4807 Spinal stenosis, lumbosacral region: Secondary | ICD-10-CM

## 2020-02-26 DIAGNOSIS — M2242 Chondromalacia patellae, left knee: Secondary | ICD-10-CM

## 2020-02-26 DIAGNOSIS — M545 Low back pain, unspecified: Secondary | ICD-10-CM

## 2020-02-26 MED ORDER — KETOROLAC TROMETHAMINE 60 MG/2ML IM SOLN
60.0000 mg | Freq: Once | INTRAMUSCULAR | Status: AC
  Administered 2020-02-26: 60 mg via INTRAMUSCULAR

## 2020-02-26 MED ORDER — METHYLPREDNISOLONE ACETATE 80 MG/ML IJ SUSP
80.0000 mg | Freq: Once | INTRAMUSCULAR | Status: AC
  Administered 2020-02-26: 80 mg via INTRAMUSCULAR

## 2020-02-26 MED ORDER — ALLOPURINOL 100 MG PO TABS: 200.0000 mg | ORAL_TABLET | Freq: Every day | ORAL | 3 refills | Status: DC

## 2020-02-26 MED FILL — ALLOPURINOL 100 MG TABS: 100 | 30 days supply | Qty: 60 | Fill #0

## 2020-02-26 MED FILL — PRALUENT 150 MG/ML SOAJ: 150 | 28 days supply | Qty: 2 | Fill #2

## 2020-02-26 NOTE — Patient Instructions (Addendum)
Allopurinol 200 mg sent in Epid[facility] Call [phone] See me again in 4 weeks

## 2020-02-26 NOTE — Progress Notes (Signed)
[facility] [address] [place] Phone: [phone] Subjective:   I [name] [name] am serving as a Education administrator for Dr. Hulan Saas.  This visit occurred during the SARS-CoV-2 public health emergency.  Safety protocols were in place, including screening questions prior to the visit, additional usage of staff PPE, and extensive cleaning of exam room while observing appropriate contact time as indicated for disinfecting solutions.   I'm seeing this patient by the request  of:  Patient, No Pcp Per  CC: Left knee, left hip pain  GBT:[id number]   09/25/2019 Moderate to severe.  X-rays pending, concern for gout or pseudogout when looked on the ultrasound.  Discussed with patient about icing regimen and home exercises.  Patient given a hinged brace because she would not fit a Tru pull lite.  Follow-up with me again in 4 to 6 weeks worsening pain consider injection and formal physical therapy.   Patient has made significant progress at this time.  We discussed the patient does have a spinal stenosis and may have increasing pain from time to time but has responded well to the epidural.  Encourage her to continue with the formal physical therapy.  Patient discussed the possibility of a heel lift which I did not think was significant.  Will consider only if correction.  Patient will continue with everything and call us when she needs [place].  Update 02/26/2020 [name] [name] is a 59 y.o. female coming in with complaint of left hip and left knee pain. Knee is not doing well. Patient is limping. Patient has had 2 epidurals. Pain originates from the back. Patient can't lay or walk any distance without pain. Patient received a brace last visit and is taking tart cherry. Knee brace initially took all her pain away but now it does not help.  Patient states that the knee and the hip is severe at the moment.  Patient is continuing to try to stay active but is finding it  difficult recently.  Has noticed things with family recently.  Patient is also having difficulty even walking 100 to 200 feet.    Past Medical History:  Diagnosis Date  . Cerebral aneurysm 1999   Surgically coiled  . DDD (degenerative disc disease)   . GERD (gastroesophageal reflux disease)   . History of Barrett's esophagus   . Hypercholesterolemia   . Hypertension   . Irritable bowel disease   . Sinus disease    Past Surgical History:  Procedure Laterality Date  . ABDOMINAL HYSTERECTOMY  2005  . APPENDECTOMY    . ARTHROTOMY Right 12/11/2015   Procedure: OPEN ARTHROTOMY ;  Surgeon: Roseanne Kaufman, MD;  Location: [facility];  Service: Orthopedics;  Laterality: Right;  . AUGMENTATION MAMMAPLASTY Bilateral   . BREAST ENHANCEMENT SURGERY    . CEREBRAL ANEURYSM REPAIR  1999  . DIAGNOSTIC LAPAROSCOPY    . LUMBAR DISC SURGERY Left 06/03/2010   Procedure: ANTEROLATERAL DECOMPRESSION AND ARTHRODESIS L3-4, L4-5  . NASAL SINUS SURGERY  2013  . ORIF ANKLE FRACTURE  03/01/2012   Procedure: OPEN REDUCTION INTERNAL FIXATION (ORIF) ANKLE FRACTURE;  Surgeon: Wylene Simmer, MD;  Location: [facility];  Service: Orthopedics;  Laterality: Right;  Open reduction internal fixation right navicular fracture  . REFRACTIVE SURGERY    . SHOULDER ARTHROSCOPY Left   . SYNOVECTOMY Right 12/11/2015   Procedure: SYNOVECTOMY DISTAL RADIOULNAR JOINT LOOSE BODY REMOVAL ;  Surgeon: Roseanne Kaufman, MD;  Location: [facility];  Service: Orthopedics;  Laterality: Right;  . WRIST ARTHROSCOPY Right 12/11/2015   Procedure: RIGHT WRIST ARTHROSCOPY WITH DEBRIDEMENT ;  Surgeon: Roseanne Kaufman, MD;  Location: [facility];  Service: Orthopedics;  Laterality: Right;   Social History   Socioeconomic History  . Marital status: Divorced    Spouse name: Not on file  . Number of children: Not on file  . Years of education: Not on file  . Highest education level: Not on file   Occupational History    Employer: [employer]  Tobacco Use  . Smoking status: Former Smoker    Quit date: 11/14/1984    Years since quitting: 35.3  . Smokeless tobacco: Never Used  Substance and Sexual Activity  . Alcohol use: Yes    Comment: social  . Drug use: No  . Sexual activity: Not on file  Other Topics Concern  . Not on file  Social History Narrative   The patient works as a Marine scientist in the electrophysiology department. Her daughter and 5-monthold granddaughter live with her. She does not smoke cigarettes.   Social Determinants of Health   Financial Resource Strain:   . Difficulty of Paying Living Expenses:   Food Insecurity:   . Worried About RCharity fundraiserin the Last Year:   . RArboriculturistin the Last Year:   Transportation Needs:   . LFilm/video editor(Medical):   .Marland KitchenLack of Transportation (Non-Medical):   Physical Activity:   . Days of Exercise per Week:   . Minutes of Exercise per Session:   Stress:   . Feeling of Stress :   Social Connections:   . Frequency of Communication with Friends and Family:   . Frequency of Social Gatherings with Friends and Family:   . Attends Religious Services:   . Active Member of Clubs or Organizations:   . Attends CArchivistMeetings:   .Marland KitchenMarital Status:    Allergies  Allergen Reactions  . Hydrocodone-Acetaminophen Itching  . Lipitor [Atorvastatin Calcium] Other (See Comments)    Elevated liver enzymes  . Morphine And Related Nausea Only  . Tetracyclines & Related Rash    Break out on all extremties   Family History  Problem Relation Age of Onset  . Hypertension Father   . Hypertension Mother   . Osteopenia Mother   . Osteopenia Maternal Grandmother      Current Outpatient Medications (Cardiovascular):  .Marland Kitchen Alirocumab (PRALUENT) 150 MG/ML SOAJ, Inject 150 mg as directed every 14 (fourteen) days. .Marland Kitchen atenolol (TENORMIN) 50 MG tablet, Take 1 tablet (50 mg total) by mouth at bedtime. .   hydrochlorothiazide (HYDRODIURIL) 25 MG tablet, Take 1 tablet (25 mg total) by mouth daily. .Marland Kitchen losartan (COZAAR) 100 MG tablet, TAKE 1 TABLET BY MOUTH DAILY. .Marland Kitchen Pitavastatin Calcium (LIVALO) 1 MG TABS, Take 1 tablet (1 mg total) by mouth daily.   Current Outpatient Medications (Analgesics):  .  allopurinol (ZYLOPRIM) 100 MG tablet, Take 2 tablets (200 mg total) by mouth daily.   Current Outpatient Medications (Other):  .Marland Kitchen Calcium Citrate-Vitamin D 315-250 MG-UNIT TABS, Take 1 tablet by mouth 2 (two) times daily. .  cephALEXin (KEFLEX) 500 MG capsule, Take 1 capsule (500 mg total) by mouth 2 (two) times daily. .  MULTIPLE VITAMINS-MINERALS PO, Take by mouth. .  nitrofurantoin, macrocrystal-monohydrate, (MACROBID) 100 MG capsule, Take 1 capsule (100 mg total) by mouth 2 (two) times daily. .  Omega-3 Fatty Acids (  FISH OIL) 1000 MG CAPS, Take 1,400 mg by mouth.  .  pantoprazole (PROTONIX) 40 MG tablet, Take 1 tablet (40 mg total) by mouth daily. .  traZODone (DESYREL) 50 MG tablet, TAKE 1/2 TO 1 TABLET BY MOUTH AT BEDTIME AS NEEDED FOR SLEEP. Marland Kitchen  valACYclovir (VALTREX) 500 MG tablet, Take 500 mg by mouth 2 (two) times daily.   Reviewed prior external information including notes and imaging from  primary care provider As well as notes that were available from care everywhere and other healthcare systems.  Past medical history, social, surgical and family history all reviewed in electronic medical record.  No pertanent information unless stated regarding to the chief complaint.   Review of Systems:  No headache, visual changes, nausea, vomiting, diarrhea, constipation, dizziness, abdominal pain, skin rash, fevers, chills, night sweats, weight loss, swollen lymph nodes, body aches, joint swelling, chest pain, shortness of breath, mood changes. POSITIVE muscle aches  Objective  Blood pressure 110/86, pulse 86, height 5' 9"  (1.753 m), weight 169 lb (76.7 kg), SpO2 96 %.   General: No apparent  distress alert and oriented x3 mood and affect normal, dressed appropriately.  HEENT: Pupils equal, extraocular movements intact  Respiratory: Patient's speak in full sentences and does not appear short of breath  Cardiovascular: No lower extremity edema, non tender, no erythema  Neuro: Cranial nerves II through XII are intact, neurovascularly intact in all extremities with 2+ DTRs and 2+ pulses.  Gait antalgic favoring left leg.  Left hip does have some positive impingement noted with internal and external range of motion.  4 out of 5 strength compared to the contralateral side.  Voluntary guarding noted.  Minimal tenderness noted on the greater trochanteric area but positive straight leg test.  Severe tenderness in the paraspinal musculature lumbar spine\  Left knee does have arthritic changes.  Patient does have trace effusion noted.  Positive patellar grind test noted.  Mild fullness of the knee noted.  After informed written and verbal consent, patient was seated on exam table. Left knee was prepped with alcohol swab and utilizing anterolateral approach, patient's left knee space was injected with 4:1  marcaine 0.5%: Kenalog 24m/dL. Patient tolerated the procedure well without immediate complications.   Impression and Recommendations:     This case required medical decision making of moderate complexity. The above documentation has been reviewed and is accurate and complete ZLyndal Pulley DO       Note: This dictation was prepared with Dragon dictation along with smaller phrase technology. Any transcriptional errors that result from this process are unintentional.

## 2020-02-26 NOTE — Assessment & Plan Note (Signed)
Moderate to severe arthritic changes with significant exacerbation.  Worsening symptoms started on allopurinol secondary to likely some gout secondary to the erythema and swelling noted as well.  We discussed icing regimen and home exercises, discussed wearing the brace on a more regular basis.  Could be a candidate for viscosupplementation.  Follow-up again in 4 to 8 weeks

## 2020-02-26 NOTE — Assessment & Plan Note (Signed)
Found to have spinal stenosis in the past has responded very well to epidurals.  Patient will increase activity but will have another epidural at this time with worsening symptoms that is affecting daily activities.  Patient also given medications including the allopurinol as we discussed.  Patient wants to avoid surgical intervention.  Handicap parking sticker given today.  Follow-up with me again in 3 weeks after epidural.

## 2020-02-29 ENCOUNTER — Ambulatory Visit: Payer: 59

## 2020-03-10 NOTE — Discharge Instructions (Signed)

## 2020-03-11 ENCOUNTER — Ambulatory Visit
Admission: RE | Admit: 2020-03-11 | Discharge: 2020-03-11 | Disposition: A | Discharge status: Home / Self Care | Payer: 59 | Source: Ambulatory Visit | Attending: Family Medicine | Admitting: Family Medicine

## 2020-03-11 DIAGNOSIS — M545 Low back pain, unspecified: Secondary | ICD-10-CM

## 2020-03-11 MED ORDER — DIAZEPAM 5 MG PO TABS
5.0000 mg | ORAL_TABLET | Freq: Once | ORAL | Status: AC
  Administered 2020-03-11: 5 mg via ORAL

## 2020-03-11 MED ORDER — IOPAMIDOL (ISOVUE-M 200) INJECTION 41%
1.0000 mL | Freq: Once | INTRAMUSCULAR | Status: AC
  Administered 2020-03-11: 1 mL via EPIDURAL

## 2020-03-11 MED ORDER — METHYLPREDNISOLONE ACETATE 40 MG/ML INJ SUSP (RADIOLOG
120.0000 mg | Freq: Once | INTRAMUSCULAR | Status: AC
  Administered 2020-03-11: 120 mg via EPIDURAL

## 2020-04-01 ENCOUNTER — Other Ambulatory Visit: Payer: Self-pay

## 2020-04-01 ENCOUNTER — Encounter: Payer: Self-pay | Admitting: Family Medicine

## 2020-04-01 ENCOUNTER — Ambulatory Visit: Payer: 59 | Admitting: Family Medicine

## 2020-04-01 DIAGNOSIS — M2242 Chondromalacia patellae, left knee: Secondary | ICD-10-CM

## 2020-04-01 DIAGNOSIS — M4807 Spinal stenosis, lumbosacral region: Secondary | ICD-10-CM

## 2020-04-01 NOTE — Assessment & Plan Note (Signed)
Significant improvement at this time.  Patient's past medical history significant though as well for a sacral fracture that we will potentially need to monitor but I believe all of this is secondary to the radicular symptoms especially with the improvement with the injection.  Patient will increase activity slowly over the course the next several weeks.  Discussed icing regimen and home exercises.  Patient does have a handicap sticker that she can use.  Continue the allopurinol.  Follow-up with me again 6 to 8 weeks

## 2020-04-01 NOTE — Progress Notes (Signed)
[facility] [address] [place] Phone: [phone] Subjective:   I [name] [name] am serving as a Education administrator for Dr. Hulan Saas.  This visit occurred during the SARS-CoV-2 public health emergency.  Safety protocols were in place, including screening questions prior to the visit, additional usage of staff PPE, and extensive cleaning of exam room while observing appropriate contact time as indicated for disinfecting solutions.   I'm seeing this patient by the request  of:  Orma Flaming, MD  CC: Low back pain, knee pain follow-up  GNF:[id number]   02/26/2020 Found to have spinal stenosis in the past has responded very well to epidurals.  Patient will increase activity but will have another epidural at this time with worsening symptoms that is affecting daily activities.  Patient also given medications including the allopurinol as we discussed.  Patient wants to avoid surgical intervention.  Handicap parking sticker given today.  Follow-up with me again in 3 weeks after epidural.  Moderate to severe arthritic changes with significant exacerbation.  Worsening symptoms started on allopurinol secondary to likely some gout secondary to the erythema and swelling noted as well.  We discussed icing regimen and home exercises, discussed wearing the brace on a more regular basis.  Could be a candidate for viscosupplementation.  Follow-up again in 4 to 8 weeks  Update 04/01/2020 [name] [name] is a 59 y.o. female coming in with complaint of low back pain. Epidural on 03/11/2020. Patient states she is feeling good. 1 week after injection she hurt her knee running. Knee is vulnerable but not hurting.  Patient was found to have moderate arthritic changes of the knee.  Responded well to the injection.  States that nothing severe that is stopping her from activity.  Had an episode there where she had to run across a field that was uneven and seem to give her knee some  difficulty.  Patient states since the epidural though the back is approximately 9095% better.  Happy with the result so far.    Past Medical History:  Diagnosis Date  . Cerebral aneurysm 1999   Surgically coiled  . DDD (degenerative disc disease)   . GERD (gastroesophageal reflux disease)   . History of Barrett's esophagus   . Hypercholesterolemia   . Hypertension   . Irritable bowel disease   . Sinus disease    Past Surgical History:  Procedure Laterality Date  . ABDOMINAL HYSTERECTOMY  2005  . APPENDECTOMY    . ARTHROTOMY Right 12/11/2015   Procedure: OPEN ARTHROTOMY ;  Surgeon: Roseanne Kaufman, MD;  Location: [facility];  Service: Orthopedics;  Laterality: Right;  . AUGMENTATION MAMMAPLASTY Bilateral   . BREAST ENHANCEMENT SURGERY    . CEREBRAL ANEURYSM REPAIR  1999  . DIAGNOSTIC LAPAROSCOPY    . LUMBAR DISC SURGERY Left 06/03/2010   Procedure: ANTEROLATERAL DECOMPRESSION AND ARTHRODESIS L3-4, L4-5  . NASAL SINUS SURGERY  2013  . ORIF ANKLE FRACTURE  03/01/2012   Procedure: OPEN REDUCTION INTERNAL FIXATION (ORIF) ANKLE FRACTURE;  Surgeon: Wylene Simmer, MD;  Location: [facility];  Service: Orthopedics;  Laterality: Right;  Open reduction internal fixation right navicular fracture  . REFRACTIVE SURGERY    . SHOULDER ARTHROSCOPY Left   . SYNOVECTOMY Right 12/11/2015   Procedure: SYNOVECTOMY DISTAL RADIOULNAR JOINT LOOSE BODY REMOVAL ;  Surgeon: Roseanne Kaufman, MD;  Location: [facility];  Service: Orthopedics;  Laterality: Right;  . WRIST ARTHROSCOPY Right 12/11/2015   Procedure:  RIGHT WRIST ARTHROSCOPY WITH DEBRIDEMENT ;  Surgeon: Roseanne Kaufman, MD;  Location: [facility];  Service: Orthopedics;  Laterality: Right;   Social History   Socioeconomic History  . Marital status: Divorced    Spouse name: Not on file  . Number of children: Not on file  . Years of education: Not on file  . Highest education level: Not on file   Occupational History    Employer: [employer]  Tobacco Use  . Smoking status: Former Smoker    Quit date: 11/14/1984    Years since quitting: 35.4  . Smokeless tobacco: Never Used  Substance and Sexual Activity  . Alcohol use: Yes    Comment: social  . Drug use: No  . Sexual activity: Not on file  Other Topics Concern  . Not on file  Social History Narrative   The patient works as a Marine scientist in the electrophysiology department. Her daughter and 97-monthold granddaughter live with her. She does not smoke cigarettes.   Social Determinants of Health   Financial Resource Strain:   . Difficulty of Paying Living Expenses:   Food Insecurity:   . Worried About RCharity fundraiserin the Last Year:   . RArboriculturistin the Last Year:   Transportation Needs:   . LFilm/video editor(Medical):   .Marland KitchenLack of Transportation (Non-Medical):   Physical Activity:   . Days of Exercise per Week:   . Minutes of Exercise per Session:   Stress:   . Feeling of Stress :   Social Connections:   . Frequency of Communication with Friends and Family:   . Frequency of Social Gatherings with Friends and Family:   . Attends Religious Services:   . Active Member of Clubs or Organizations:   . Attends CArchivistMeetings:   .Marland KitchenMarital Status:    Allergies  Allergen Reactions  . Hydrocodone-Acetaminophen Itching  . Lipitor [Atorvastatin Calcium] Other (See Comments)    Elevated liver enzymes  . Morphine And Related Nausea Only  . Tetracyclines & Related Rash    Break out on all extremties   Family History  Problem Relation Age of Onset  . Hypertension Father   . Hypertension Mother   . Osteopenia Mother   . Osteopenia Maternal Grandmother      Current Outpatient Medications (Cardiovascular):  .Marland Kitchen Alirocumab (PRALUENT) 150 MG/ML SOAJ, Inject 150 mg as directed every 14 (fourteen) days. .Marland Kitchen atenolol (TENORMIN) 50 MG tablet, Take 1 tablet (50 mg total) by mouth at bedtime. .   hydrochlorothiazide (HYDRODIURIL) 25 MG tablet, Take 1 tablet (25 mg total) by mouth daily. .Marland Kitchen losartan (COZAAR) 100 MG tablet, TAKE 1 TABLET BY MOUTH DAILY. .Marland Kitchen Pitavastatin Calcium (LIVALO) 1 MG TABS, Take 1 tablet (1 mg total) by mouth daily.   Current Outpatient Medications (Analgesics):  .  allopurinol (ZYLOPRIM) 100 MG tablet, Take 2 tablets (200 mg total) by mouth daily.   Current Outpatient Medications (Other):  .Marland Kitchen Calcium Citrate-Vitamin D 315-250 MG-UNIT TABS, Take 1 tablet by mouth 2 (two) times daily. .  cephALEXin (KEFLEX) 500 MG capsule, Take 1 capsule (500 mg total) by mouth 2 (two) times daily. .  MULTIPLE VITAMINS-MINERALS PO, Take by mouth. .  nitrofurantoin, macrocrystal-monohydrate, (MACROBID) 100 MG capsule, Take 1 capsule (100 mg total) by mouth 2 (two) times daily. .  Omega-3 Fatty Acids (FISH OIL) 1000 MG CAPS, Take 1,400 mg by mouth.  .  pantoprazole (PROTONIX) 40  MG tablet, Take 1 tablet (40 mg total) by mouth daily. .  traZODone (DESYREL) 50 MG tablet, TAKE 1/2 TO 1 TABLET BY MOUTH AT BEDTIME AS NEEDED FOR SLEEP. Marland Kitchen  valACYclovir (VALTREX) 500 MG tablet, Take 500 mg by mouth 2 (two) times daily.   Reviewed prior external information including notes and imaging from  primary care provider As well as notes that were available from care everywhere and other healthcare systems.  Past medical history, social, surgical and family history all reviewed in electronic medical record.  No pertanent information unless stated regarding to the chief complaint.   Review of Systems:  No headache, visual changes, nausea, vomiting, diarrhea, constipation, dizziness, abdominal pain, skin rash, fevers, chills, night sweats, weight loss, swollen lymph nodes, body aches, joint swelling, chest pain, shortness of breath, mood changes. POSITIVE muscle aches  Objective  Blood pressure 110/70, pulse 87, height 5' 9"  (1.753 m), weight 170 lb (77.1 kg), SpO2 97 %.   General: No apparent  distress alert and oriented x3 mood and affect normal, dressed appropriately.  HEENT: Pupils equal, extraocular movements intact  Respiratory: Patient's speak in full sentences and does not appear short of breath  Cardiovascular: No lower extremity edema, non tender, no erythema  Neuro: Cranial nerves II through XII are intact, neurovascularly intact in all extremities with 2+ DTRs and 2+ pulses.  Gait still antalgic MSK:  tender with full range of motion and good stability and symmetric strength and tone of shoulders, elbows, wrist, hip, knee and ankles bilaterally.  Back exam mild loss of lordosis.  Mild tightness.  Patient seemed very comfortable sitting in the chair.  Neurovascularly intact distally.  Knee exam showed no significant swelling noted at the moment.  Full range of motion at the moment.  Passive as well as active.   Impression and Recommendations:      The above documentation has been reviewed and is accurate and complete Lyndal Pulley, DO       Note: This dictation was prepared with Dragon dictation along with smaller phrase technology. Any transcriptional errors that result from this process are unintentional.

## 2020-04-01 NOTE — Patient Instructions (Addendum)
Good to see you Doing well  No change in medications See me again in 8 weeks just in case Can always call sooner if you want epidural

## 2020-04-01 NOTE — Assessment & Plan Note (Signed)
Moderate to severe overall but has responded well to the injection.  Discussed the possibility of viscosupplementation but at the moment patient feels like she can hold on this.  Discussed icing regimen.  Discussed continuing with the Tru pull lite brace for stability of the kneecap.  Follow-up again 6 to 8 weeks

## 2020-05-18 MED FILL — PRALUENT 150 MG/ML SOAJ: 150 | 28 days supply | Qty: 2 | Fill #5

## 2020-05-21 ENCOUNTER — Other Ambulatory Visit: Payer: Self-pay | Admitting: Family Medicine

## 2020-05-21 MED FILL — ATENOLOL 50 MG TABLET: 50 | 90 days supply | Qty: 90 | Fill #0

## 2020-05-27 ENCOUNTER — Ambulatory Visit: Payer: 59 | Admitting: Family Medicine

## 2020-05-27 ENCOUNTER — Encounter: Payer: Self-pay | Admitting: Family Medicine

## 2020-05-27 ENCOUNTER — Other Ambulatory Visit: Payer: Self-pay

## 2020-05-27 ENCOUNTER — Ambulatory Visit (INDEPENDENT_AMBULATORY_CARE_PROVIDER_SITE_OTHER): Payer: 59 | Admitting: Family Medicine

## 2020-05-27 VITALS — BP 114/84 | HR 71 | Ht 69.0 in | Wt 170.0 lb

## 2020-05-27 VITALS — BP 122/82 | HR 70 | Temp 97.6°F | Ht 69.0 in | Wt 170.6 lb

## 2020-05-27 DIAGNOSIS — Z0000 Encounter for general adult medical examination without abnormal findings: Secondary | ICD-10-CM | POA: Diagnosis not present

## 2020-05-27 DIAGNOSIS — R918 Other nonspecific abnormal finding of lung field: Secondary | ICD-10-CM | POA: Diagnosis not present

## 2020-05-27 DIAGNOSIS — I10 Essential (primary) hypertension: Secondary | ICD-10-CM

## 2020-05-27 DIAGNOSIS — M4807 Spinal stenosis, lumbosacral region: Secondary | ICD-10-CM | POA: Diagnosis not present

## 2020-05-27 DIAGNOSIS — E782 Mixed hyperlipidemia: Secondary | ICD-10-CM

## 2020-05-27 DIAGNOSIS — Z23 Encounter for immunization: Secondary | ICD-10-CM

## 2020-05-27 DIAGNOSIS — K648 Other hemorrhoids: Secondary | ICD-10-CM

## 2020-05-27 DIAGNOSIS — L989 Disorder of the skin and subcutaneous tissue, unspecified: Secondary | ICD-10-CM | POA: Diagnosis not present

## 2020-05-27 LAB — COMPREHENSIVE METABOLIC PANEL
ALT: 43 U/L — ABNORMAL HIGH (ref 0–35)
AST: 44 U/L — ABNORMAL HIGH (ref 0–37)
Albumin: 4.7 g/dL (ref 3.5–5.2)
Alkaline Phosphatase: 75 U/L (ref 39–117)
BUN: 22 mg/dL (ref 6–23)
CO2: 32 mEq/L (ref 19–32)
Calcium: 9.9 mg/dL (ref 8.4–10.5)
Chloride: 99 mEq/L (ref 96–112)
Creatinine, Ser: 0.63 mg/dL (ref 0.40–1.20)
GFR: 96.09 mL/min (ref >60.00)
Glucose, Bld: 109 mg/dL — ABNORMAL HIGH (ref 70–99)
Potassium: 3.8 mEq/L (ref 3.5–5.1)
Sodium: 140 mEq/L (ref 135–145)
Total Bilirubin: 0.7 mg/dL (ref 0.2–1.2)
Total Protein: 7.7 g/dL (ref 6.0–8.3)

## 2020-05-27 LAB — MICROALBUMIN / CREATININE URINE RATIO
Creatinine,U: 155 mg/dL
Microalb Creat Ratio: 1.6 mg/g (ref 0.0–30.0)
Microalb, Ur: 2.4 mg/dL — ABNORMAL HIGH (ref 0.0–1.9)

## 2020-05-27 LAB — CBC WITH DIFFERENTIAL/PLATELET
Basophils Absolute: 0.1 10*3/uL (ref 0.0–0.1)
Basophils Relative: 0.9 % (ref 0.0–3.0)
Eosinophils Absolute: 0.1 10*3/uL (ref 0.0–0.7)
Eosinophils Relative: 1.5 % (ref 0.0–5.0)
HCT: 39.5 % (ref 36.0–46.0)
Hemoglobin: 13.3 g/dL (ref 12.0–15.0)
Lymphocytes Relative: 33.3 % (ref 12.0–46.0)
Lymphs Abs: 1.8 10*3/uL (ref 0.7–4.0)
MCHC: 33.7 g/dL (ref 30.0–36.0)
MCV: 101.9 fl — ABNORMAL HIGH (ref 78.0–100.0)
Monocytes Absolute: 0.5 10*3/uL (ref 0.1–1.0)
Monocytes Relative: 9.5 % (ref 3.0–12.0)
Neutro Abs: 3 10*3/uL (ref 1.4–7.7)
Neutrophils Relative %: 54.8 % (ref 43.0–77.0)
Platelets: 229 10*3/uL (ref 150.0–400.0)
RBC: 3.88 Mil/uL (ref 3.87–5.11)
RDW: 12.6 % (ref 11.5–15.5)
WBC: 5.5 10*3/uL (ref 4.0–10.5)

## 2020-05-27 LAB — TSH: TSH: 1.37 u[IU]/mL (ref 0.35–4.50)

## 2020-05-27 MED ORDER — METHYLPREDNISOLONE ACETATE 80 MG/ML IJ SUSP
80.0000 mg | Freq: Once | INTRAMUSCULAR | Status: AC
  Administered 2020-05-27: 80 mg via INTRAMUSCULAR

## 2020-05-27 MED ORDER — KETOROLAC TROMETHAMINE 60 MG/2ML IM SOLN
60.0000 mg | Freq: Once | INTRAMUSCULAR | Status: AC
  Administered 2020-05-27: 60 mg via INTRAMUSCULAR

## 2020-05-27 MED ORDER — COLCHICINE 0.6 MG PO TABS: 0.6000 mg | ORAL_TABLET | Freq: Every day | ORAL | 0 refills | Status: DC

## 2020-05-27 MED FILL — COLCHICINE 0.6 MG TABS: 0.6 | 30 days supply | Qty: 30 | Fill #0

## 2020-05-27 NOTE — Patient Instructions (Signed)
Epidural Colchicine 2x a day for 5 days when you have flares See me again in 8 weeks

## 2020-05-27 NOTE — Progress Notes (Signed)
[place] Phone: [phone] Subjective:   [name], am serving as a scribe for Dr. Hulan Saas. This visit occurred during the SARS-CoV-2 public health emergency.  Safety protocols were in place, including screening questions prior to the visit, additional usage of staff PPE, and extensive cleaning of exam room while observing appropriate contact time as indicated for disinfecting solutions.   I'm seeing this patient by the request  of:  Orma Flaming, MD  CC: Back pain and hip pain follow-up  LEX:[id number]   04/01/2020 Moderate to severe overall but has responded well to the injection.  Discussed the possibility of viscosupplementation but at the moment patient feels like she can hold on this.  Discussed icing regimen.  Discussed continuing with the Tru pull lite brace for stability of the kneecap.  Follow-up again 6 to 8 weeks  Significant improvement at this time.  Patient's past medical history significant though as well for a sacral fracture that we will potentially need to monitor but I believe all of this is secondary to the radicular symptoms especially with the improvement with the injection.  Patient will increase activity slowly over the course the next several weeks.  Discussed icing regimen and home exercises.  Patient does have a handicap sticker that she can use.  Continue the allopurinol.  Follow-up with me again 6 to 8 weeks  Update 05/27/2020 ARABELLA REVELLE is a 59 y.o. female coming in with complaint of lumbar spinal stenosis. Had 2 weeks of relief from epidural injection. Pain in left hip over lateral aspect. Feels like her hip pops out of place. Epidural 03/11/2020.  Patient states that she feels like she is worsening again.  She has not had 2 epidurals in the 64-month  Patient is having more severe pain that is starting to affect daily activities as well as her job.  Sometimes even painful at  work.  Has been taking over-the-counter pain medications on a more regular basis as well        Past Medical History:  Diagnosis Date  . Cerebral aneurysm 1999   Surgically coiled  . DDD (degenerative disc disease)   . GERD (gastroesophageal reflux disease)   . History of Barrett's esophagus   . Hypercholesterolemia   . Hypertension   . Irritable bowel disease   . Sinus disease    Past Surgical History:  Procedure Laterality Date  . ABDOMINAL HYSTERECTOMY  2005  . APPENDECTOMY    . ARTHROTOMY Right 12/11/2015   Procedure: OPEN ARTHROTOMY ;  Surgeon: WRoseanne Kaufman MD;  [facility]  Service: Orthopedics;  Laterality: Right;  . AUGMENTATION MAMMAPLASTY Bilateral   . BREAST ENHANCEMENT SURGERY    . CEREBRAL ANEURYSM REPAIR  1999  . DIAGNOSTIC LAPAROSCOPY    . LUMBAR DISC SURGERY Left 06/03/2010   Procedure: ANTEROLATERAL DECOMPRESSION AND ARTHRODESIS L3-4, L4-5  . NASAL SINUS SURGERY  2013  . ORIF ANKLE FRACTURE  03/01/2012   Procedure: OPEN REDUCTION INTERNAL FIXATION (ORIF) ANKLE FRACTURE;  Surgeon: JWylene Simmer MD;  [facility]  Service: Orthopedics;  Laterality: Right;  Open reduction internal fixation right navicular fracture  . REFRACTIVE SURGERY    . SHOULDER ARTHROSCOPY Left   . SYNOVECTOMY Right 12/11/2015   Procedure: SYNOVECTOMY DISTAL RADIOULNAR JOINT LOOSE BODY REMOVAL ;  Surgeon: WRoseanne Kaufman MD;  [facility]  Service: Orthopedics;  Laterality: Right;  . WRIST ARTHROSCOPY Right  12/11/2015   Procedure: RIGHT WRIST ARTHROSCOPY WITH DEBRIDEMENT ;  Surgeon: Roseanne Kaufman, MD;  Location: [facility];  Service: Orthopedics;  Laterality: Right;   Social History   Socioeconomic History  . Marital status: Divorced    Spouse name: Not on file  . Number of children: Not on file  . Years of education: Not on file  . Highest education level: Not on file  Occupational History     Employer: [employer]  Tobacco Use  . Smoking status: Former Smoker    Quit date: 11/14/1984    Years since quitting: 35.5  . Smokeless tobacco: Never Used  Substance and Sexual Activity  . Alcohol use: Yes    Comment: social  . Drug use: No  . Sexual activity: Not on file  Other Topics Concern  . Not on file  Social History Narrative   The patient works as a Marine scientist in the electrophysiology department. Her daughter and 66-monthold granddaughter live with her. She does not smoke cigarettes.   Social Determinants of Health   Financial Resource Strain:   . Difficulty of Paying Living Expenses:   Food Insecurity:   . Worried About RCharity fundraiserin the Last Year:   . RArboriculturistin the Last Year:   Transportation Needs:   . LFilm/video editor(Medical):   .Marland KitchenLack of Transportation (Non-Medical):   Physical Activity:   . Days of Exercise per Week:   . Minutes of Exercise per Session:   Stress:   . Feeling of Stress :   Social Connections:   . Frequency of Communication with Friends and Family:   . Frequency of Social Gatherings with Friends and Family:   . Attends Religious Services:   . Active Member of Clubs or Organizations:   . Attends CArchivistMeetings:   .Marland KitchenMarital Status:    Allergies  Allergen Reactions  . Hydrocodone-Acetaminophen Itching  . Lipitor [Atorvastatin Calcium] Other (See Comments)    Elevated liver enzymes  . Morphine And Related Nausea Only  . Tetracyclines & Related Rash    Break out on all extremties   Family History  Problem Relation Age of Onset  . Hypertension Father   . Hypertension Mother   . Osteopenia Mother   . Osteopenia Maternal Grandmother      Current Outpatient Medications (Cardiovascular):  .Marland Kitchen Alirocumab (PRALUENT) 150 MG/ML SOAJ, Inject 150 mg as directed every 14 (fourteen) days. .Marland Kitchen atenolol (TENORMIN) 50 MG tablet, TAKE 1 TABLET (50 MG TOTAL) BY MOUTH AT BEDTIME. .  hydrochlorothiazide (HYDRODIURIL)  25 MG tablet, Take 1 tablet (25 mg total) by mouth daily. .Marland Kitchen losartan (COZAAR) 100 MG tablet, TAKE 1 TABLET BY MOUTH DAILY. .Marland Kitchen Pitavastatin Calcium (LIVALO) 1 MG TABS, Take 1 tablet (1 mg total) by mouth daily.   Current Outpatient Medications (Analgesics):  .  allopurinol (ZYLOPRIM) 100 MG tablet, Take 2 tablets (200 mg total) by mouth daily. .  colchicine 0.6 MG tablet, Take 1 tablet (0.6 mg total) by mouth daily.   Current Outpatient Medications (Other):  .Marland Kitchen Calcium Citrate-Vitamin D 315-250 MG-UNIT TABS, Take 1 tablet by mouth 2 (two) times daily. .  MULTIPLE VITAMINS-MINERALS PO, Take by mouth. .  Omega-3 Fatty Acids (FISH OIL) 1000 MG CAPS, Take 1,400 mg by mouth.  .  pantoprazole (PROTONIX) 40 MG tablet, Take 1 tablet (40 mg total) by mouth daily. .  valACYclovir (VALTREX) 500 MG tablet, Take 500  mg by mouth 2 (two) times daily.   Reviewed prior external information including notes and imaging from  primary care provider As well as notes that were available from care everywhere and other healthcare systems.  Past medical history, social, surgical and family history all reviewed in electronic medical record.  No pertanent information unless stated regarding to the chief complaint.   Review of Systems:  No headache, visual changes, nausea, vomiting, diarrhea, constipation, dizziness, abdominal pain, skin rash, fevers, chills, night sweats, weight loss, swollen lymph nodes, body aches, joint swelling, chest pain, shortness of breath, mood changes. POSITIVE muscle aches  Objective  Blood pressure 114/84, pulse 71, height 5' 9"  (1.753 m), weight 170 lb (77.1 kg), SpO2 99 %.   General: No apparent distress alert and oriented x3 mood and affect normal, dressed appropriately.  HEENT: Pupils equal, extraocular movements intact  Respiratory: Patient's speak in full sentences and does not appear short of breath  Cardiovascular: No lower extremity edema, non tender, no erythema  Neuro:  Cranial nerves II through XII are intact, neurovascularly intact in all extremities with 2+ DTRs and 2+ pulses.  Gait normal with good balance and coordination.  MSK:   Low back exam has significant tightness noted.  Mild positive straight leg test.  Difficulty even do FABER test on the left side secondary to pain.  Difficulty to do much more testing secondary to patient being very uncomfortable    Impression and Recommendations:     The above documentation has been reviewed and is accurate and complete Lyndal Pulley, DO       Note: This dictation was prepared with Dragon dictation along with smaller phrase technology. Any transcriptional errors that result from this process are unintentional.

## 2020-05-27 NOTE — Progress Notes (Signed)
  Charlann Boxer D.O.

## 2020-05-27 NOTE — Progress Notes (Signed)
Patient: [name] [name] MRN: [id number] DOB: 08/25/1961 PCP: Orma Flaming, MD     Subjective:  Chief Complaint  Patient presents with  . Annual Exam  . Transitions Of Care  . Hyperlipidemia  . Hypertension  . Hemorrhoids     HPI: The patient is a 59 y.o. female who presents today for annual exam. She is new to me and is a transfer of care. Chart, HM reviewed today.  She denies any changes to past medical history. There has been no recent hospitalizations. She is following a well balanced diet, but not an exercise plan. She says that she is very limited due to hip pain, but she also gets her steps in at work. Weight has been stable.   Hypertension:  Here for follow up of hypertension.  Currently on hctz 25m daily, cozaar 1034mdaily and atenolol 5051maily . Takes medication as prescribed and denies any side effects.  Weight has been stable. Denies any chest pain, headaches, shortness of breath, vision changes, swelling in lower extremities.    Hyperlipidemia -followed by Dr. HilDebara Picketturrently on livalo and praluent injection. Lipid panel checked in February and extremely well controlled.   She also would like referral for a hemorrhoid and dermatologist.   Has had her covid vaccines.    Immunization History  Administered Date(s) Administered  . Influenza Split 07/15/2014  . Influenza-Unspecified 08/01/2017  . MMR 03/21/2005  . PFIZER SARS-COV-2 Vaccination 11/22/2019, 12/13/2019  . Tdap 04/18/2008, 05/27/2020  . Zoster Recombinat (Shingrix) 09/01/2017, 02/07/2018   Colonoscopy: UTD.  Mammogram: due for this.  Pap smear: s/p hysterectomy. Hx of cervical dysplasia. (has gyn) last pap smear normal  Tdap: today    Review of Systems  Constitutional: Negative for chills, fatigue and fever.  HENT: Negative for dental problem, ear pain, hearing loss and trouble swallowing.   Eyes: Negative for visual disturbance.  Respiratory: Negative for cough, chest tightness, shortness of  breath and wheezing.   Cardiovascular: Negative for chest pain, palpitations and leg swelling.  Gastrointestinal: Negative for abdominal pain, blood in stool, diarrhea and nausea.  Endocrine: Negative for cold intolerance, polydipsia, polyphagia and polyuria.  Genitourinary: Negative for dysuria and hematuria.  Musculoskeletal: Positive for joint swelling. Negative for arthralgias.  Skin: Negative for rash.  Neurological: Negative for dizziness, light-headedness and headaches.  Psychiatric/Behavioral: Negative for dysphoric mood and sleep disturbance. The patient is not nervous/anxious.     Allergies Patient is allergic to hydrocodone-acetaminophen, lipitor [atorvastatin calcium], morphine and related, and tetracyclines & related.  Past Medical History Patient  has a past medical history of Cerebral aneurysm (1999), DDD (degenerative disc disease), GERD (gastroesophageal reflux disease), History of Barrett's esophagus, Hypercholesterolemia, Hypertension, Irritable bowel disease, and Sinus disease.  Surgical History Patient  has a past surgical history that includes Nasal sinus surgery (2013); Appendectomy; Cerebral aneurysm repair (1999); Shoulder arthroscopy (Left); Abdominal hysterectomy (2005); Breast enhancement surgery; Diagnostic laparoscopy; ORIF ankle fracture (03/01/2012); Lumbar disc surgery (Left, 06/03/2010); Wrist arthroscopy (Right, 12/11/2015); Arthrotomy (Right, 12/11/2015); Synovectomy (Right, 12/11/2015); Augmentation mammaplasty (Bilateral); and Refractive surgery.  Family History Pateint's family history includes Hypertension in her father and mother; Osteopenia in her maternal grandmother and mother.  Social History Patient  reports that she quit smoking about 35 years ago. She has never used smokeless tobacco. She reports current alcohol use. She reports that she does not use drugs.    Objective: Vitals:   05/27/20 1052  BP: 122/82  Pulse: 70  Temp: 97.6 F (36.4 C)   TempSrc:  Temporal  SpO2: 98%  Weight: 170 lb 9.6 oz (77.4 kg)  Height: _0  (1.753 m)    Body mass index is 25.19 kg/m.  Physical Exam Vitals reviewed.  Constitutional:      Appearance: Normal appearance. She is well-developed and normal weight.  HENT:     Head: Normocephalic and atraumatic.     Right Ear: Tympanic membrane, ear canal and external ear normal.     Left Ear: Tympanic membrane, ear canal and external ear normal.     Mouth/Throat:     Mouth: Mucous membranes are moist.  Eyes:     Conjunctiva/sclera: Conjunctivae normal.     Pupils: Pupils are equal, round, and reactive to light.  Neck:     Thyroid: No thyromegaly.  Cardiovascular:     Rate and Rhythm: Normal rate and regular rhythm.     Pulses: Normal pulses.     Heart sounds: Normal heart sounds. No murmur heard.   Pulmonary:     Effort: Pulmonary effort is normal.     Breath sounds: Normal breath sounds.  Abdominal:     General: Bowel sounds are normal. There is no distension.     Palpations: Abdomen is soft.     Tenderness: There is no abdominal tenderness.  Musculoskeletal:     Cervical back: Normal range of motion and neck supple.  Lymphadenopathy:     Cervical: No cervical adenopathy.  Skin:    General: Skin is warm and dry.     Capillary Refill: Capillary refill takes less than 2 seconds.     Findings: No rash.  Neurological:     General: No focal deficit present.     Mental Status: She is alert and oriented to person, place, and time.     Cranial Nerves: No cranial nerve deficit.     Coordination: Coordination normal.     Deep Tendon Reflexes: Reflexes normal.  Psychiatric:        Mood and Affect: Mood normal.        Behavior: Behavior normal.      Office Visit from 05/27/2020 in [facility]  PHQ-2 Total Score 0          Assessment/plan: 1. Annual physical exam Hm reviewed. tdap will be given today. Requested she get her cscope records. Due for mmg and she  will schedule this. Continue pap smear x 20 years, has gyn. Doing well, continue healthy lifestyle. F/u in one year or as needed.  Patient counseling _1    Nutrition: Stressed importance of moderation in sodium/caffeine intake, saturated fat and cholesterol, caloric balance, sufficient intake of fresh fruits, vegetables, fiber, calcium, iron, and 1 mg of folate supplement per day (for females capable of pregnancy).  _2    Stressed the importance of regular exercise.   _3    Substance Abuse: Discussed cessation/primary prevention of tobacco, alcohol, or other drug use; driving or other dangerous activities under the influence; availability of treatment for abuse.   _4    Injury prevention: Discussed safety belts, safety helmets, smoke detector, smoking near bedding or upholstery.   _5    Sexuality: Discussed sexually transmitted diseases, partner selection, use of condoms, avoidance of unintended pregnancy  and contraceptive alternatives.  _6    Dental health: Discussed importance of regular tooth brushing, flossing, and dental visits.  _7    Health maintenance and immunizations reviewed. Please refer to Health maintenance section.    - TSH  2. Benign hypertension Blood pressure is to goal. Continue current anti-hypertensive medications per  hpi. Refills not given and routine lab work will be done today. Recommended routine exercise and healthy diet including DASH diet and mediterranean diet. Encouraged weight loss. F/u in 12 months.   - CBC with Differential/Platelet - Comprehensive metabolic panel - Microalbumin / creatinine urine ratio  3. Pulmonary nodules Repeat in 1 year, will be due 12/2020.   4. Mixed hyperlipidemia Followed by dr. Debara Pickett and lipid panel to goal.   5. Need for Tdap vaccination  - Tdap vaccine greater than or equal to 7yo IM  6. Other hemorrhoids  - Ambulatory referral to Colorectal Surgery  7. Skin lesion  - Ambulatory referral to Dermatology   This visit  occurred during the SARS-CoV-2 public health emergency.  Safety protocols were in place, including screening questions prior to the visit, additional usage of staff PPE, and extensive cleaning of exam room while observing appropriate contact time as indicated for disinfecting solutions.     Return in about 1 year (around 05/27/2021) for htn/annual.     Orma Flaming, MD [place]  05/27/2020

## 2020-05-27 NOTE — Patient Instructions (Addendum)
1) labs today 2) referrals done for hemorrhoids and skin ([facility] and [facility]) 3) flonase at night to see if this helps congestion 4) tdap today!   So nice to meet you!  Dr. Rogers Blocker   Preventive Care 54-61 Years Old, Female Preventive care refers to visits with your health care provider and lifestyle choices that can promote health and wellness. This includes:  A yearly physical exam. This may also be called an annual well check.  Regular dental visits and eye exams.  Immunizations.  Screening for certain conditions.  Healthy lifestyle choices, such as eating a healthy diet, getting regular exercise, not using drugs or products that contain nicotine and tobacco, and limiting alcohol use. What can I expect for my preventive care visit? Physical exam Your health care provider will check your:  Height and weight. This may be used to calculate body mass index (BMI), which tells if you are at a healthy weight.  Heart rate and blood pressure.  Skin for abnormal spots. Counseling Your health care provider may ask you questions about your:  Alcohol, tobacco, and drug use.  Emotional well-being.  Home and relationship well-being.  Sexual activity.  Eating habits.  Work and work Statistician.  Method of birth control.  Menstrual cycle.  Pregnancy history. What immunizations do I need?  Influenza (flu) vaccine  This is recommended every year. Tetanus, diphtheria, and pertussis (Tdap) vaccine  You may need a Td booster every 10 years. Varicella (chickenpox) vaccine  You may need this if you have not been vaccinated. Zoster (shingles) vaccine  You may need this after age 33. Measles, mumps, and rubella (MMR) vaccine  You may need at least one dose of MMR if you were born in 1957 or later. You may also need a second dose. Pneumococcal conjugate (PCV13) vaccine  You may need this if you have certain conditions and were not previously  vaccinated. Pneumococcal polysaccharide (PPSV23) vaccine  You may need one or two doses if you smoke cigarettes or if you have certain conditions. Meningococcal conjugate (MenACWY) vaccine  You may need this if you have certain conditions. Hepatitis A vaccine  You may need this if you have certain conditions or if you travel or work in places where you may be exposed to hepatitis A. Hepatitis B vaccine  You may need this if you have certain conditions or if you travel or work in places where you may be exposed to hepatitis B. Haemophilus influenzae type b (Hib) vaccine  You may need this if you have certain conditions. Human papillomavirus (HPV) vaccine  If recommended by your health care provider, you may need three doses over 6 months. You may receive vaccines as individual doses or as more than one vaccine together in one shot (combination vaccines). Talk with your health care provider about the risks and benefits of combination vaccines. What tests do I need? Blood tests  Lipid and cholesterol levels. These may be checked every 5 years, or more frequently if you are over 61 years old.  Hepatitis C test.  Hepatitis B test. Screening  Lung cancer screening. You may have this screening every year starting at age 16 if you have a 30-pack-year history of smoking and currently smoke or have quit within the past 15 years.  Colorectal cancer screening. All adults should have this screening starting at age 61 and continuing until age 36. Your health care provider may recommend screening at age 44 if you are at increased risk. You will have  will have tests every 1-10 years, depending on your results and the type of screening test.  Diabetes screening. This is done by checking your blood sugar (glucose) after you have not eaten for a while (fasting). You may have this done every 1-3 years.  Mammogram. This may be done every 1-2 years. Talk with your health care provider about when you should start  having regular mammograms. This may depend on whether you have a family history of breast cancer.  BRCA-related cancer screening. This may be done if you have a family history of breast, ovarian, tubal, or peritoneal cancers.  Pelvic exam and Pap test. This may be done every 3 years starting at age 21. Starting at age 30, this may be done every 5 years if you have a Pap test in combination with an HPV test. Other tests  Sexually transmitted disease (STD) testing.  Bone density scan. This is done to screen for osteoporosis. You may have this scan if you are at high risk for osteoporosis. Follow these instructions at home: Eating and drinking  Eat a diet that includes fresh fruits and vegetables, whole grains, lean protein, and low-fat dairy.  Take vitamin and mineral supplements as recommended by your health care provider.  Do not drink alcohol if: ? Your health care provider tells you not to drink. ? You are pregnant, may be pregnant, or are planning to become pregnant.  If you drink alcohol: ? Limit how much you have to 0-1 drink a day. ? Be aware of how much alcohol is in your drink. In the [place] one drink equals one 12 oz bottle of beer (355 mL), one 5 oz glass of wine (148 mL), or one 1 oz glass of hard liquor (44 mL). Lifestyle  Take daily care of your teeth and gums.  Stay active. Exercise for at least 30 minutes on 5 or more days each week.  Do not use any products that contain nicotine or tobacco, such as cigarettes, e-cigarettes, and chewing tobacco. If you need help quitting, ask your health care provider.  If you are sexually active, practice safe sex. Use a condom or other form of birth control (contraception) in order to prevent pregnancy and STIs (sexually transmitted infections).  If told by your health care provider, take low-dose aspirin daily starting at age 61. What's next?  Visit your health care provider once a year for a well check visit.  Ask your health  care provider how often you should have your eyes and teeth checked.  Stay up to date on all vaccines. This information is not intended to replace advice given to you by your health care provider. Make sure you discuss any questions you have with your health care provider. Document Revised: 07/12/2018 Document Reviewed: 07/12/2018 Elsevier Patient Education  2020 Elsevier Inc.  

## 2020-05-28 ENCOUNTER — Encounter: Payer: Self-pay | Admitting: Family Medicine

## 2020-05-28 ENCOUNTER — Other Ambulatory Visit: Payer: Self-pay | Admitting: Family Medicine

## 2020-05-28 DIAGNOSIS — D7589 Other specified diseases of blood and blood-forming organs: Secondary | ICD-10-CM

## 2020-05-28 DIAGNOSIS — R7309 Other abnormal glucose: Secondary | ICD-10-CM

## 2020-05-28 DIAGNOSIS — K760 Fatty (change of) liver, not elsewhere classified: Secondary | ICD-10-CM

## 2020-05-28 NOTE — Assessment & Plan Note (Signed)
Chronic thumb with worsening symptoms.  Patient given Toradol and Depo-Medrol today.  This signed up again for another epidural which patient has responded to well in the past.  Discussed the underlying potential for gallops continuing the allopurinol for now.  We discussed the possibility of using colchicine instead of the allopurinol for more burst increase activity slowly.  Follow-up again 8 to 10 weeks

## 2020-06-22 DIAGNOSIS — H43811 Vitreous degeneration, right eye: Secondary | ICD-10-CM | POA: Diagnosis not present

## 2020-06-22 MED FILL — PRALUENT 150 MG/ML SOAJ: 150 | 28 days supply | Qty: 2 | Fill #6

## 2020-06-22 MED FILL — PANTOPRAZOLE SOD DR 40 MG T: 40 | 90 days supply | Qty: 90 | Fill #2

## 2020-06-22 MED FILL — LIVALO 1 MG TABLET: 1 | 90 days supply | Qty: 90 | Fill #2

## 2020-07-01 ENCOUNTER — Other Ambulatory Visit: Payer: Self-pay

## 2020-07-01 ENCOUNTER — Ambulatory Visit
Admission: RE | Admit: 2020-07-01 | Discharge: 2020-07-01 | Disposition: A | Discharge status: Home / Self Care | Payer: 59 | Source: Ambulatory Visit | Attending: Family Medicine | Admitting: Family Medicine

## 2020-07-01 DIAGNOSIS — M4807 Spinal stenosis, lumbosacral region: Secondary | ICD-10-CM

## 2020-07-01 DIAGNOSIS — M47817 Spondylosis without myelopathy or radiculopathy, lumbosacral region: Secondary | ICD-10-CM | POA: Diagnosis not present

## 2020-07-01 MED ORDER — DIAZEPAM 5 MG PO TABS
10.0000 mg | ORAL_TABLET | Freq: Once | ORAL | Status: AC
  Administered 2020-07-01: 10 mg via ORAL

## 2020-07-01 MED ORDER — IOPAMIDOL (ISOVUE-M 200) INJECTION 41%
1.0000 mL | Freq: Once | INTRAMUSCULAR | Status: AC
  Administered 2020-07-01: 1 mL via EPIDURAL

## 2020-07-01 MED ORDER — METHYLPREDNISOLONE ACETATE 40 MG/ML INJ SUSP (RADIOLOG
120.0000 mg | Freq: Once | INTRAMUSCULAR | Status: AC
  Administered 2020-07-01: 120 mg via EPIDURAL

## 2020-07-01 MED ORDER — DIAZEPAM 5 MG PO TABS: 5.0000 mg | ORAL_TABLET | Freq: Once | ORAL | Status: DC

## 2020-07-01 NOTE — Discharge Instructions (Signed)

## 2020-07-14 ENCOUNTER — Other Ambulatory Visit: Payer: Self-pay | Admitting: Occupational Medicine

## 2020-07-14 ENCOUNTER — Ambulatory Visit: Payer: Self-pay

## 2020-07-14 ENCOUNTER — Other Ambulatory Visit: Payer: Self-pay

## 2020-07-14 DIAGNOSIS — M25561 Pain in right knee: Secondary | ICD-10-CM

## 2020-07-14 MED FILL — HYDROCHLOROTHIAZIDE 25 MG T: 25 | 90 days supply | Qty: 90 | Fill #2

## 2020-07-14 MED FILL — LOSARTAN POTASSIUM 100 MG T: 100 | 90 days supply | Qty: 90 | Fill #3

## 2020-07-14 MED FILL — PRALUENT 150 MG/ML SOAJ: 150 | 28 days supply | Qty: 2 | Fill #7

## 2020-07-14 MED FILL — traMADol HCL 50 MG TABS: 50 | 7 days supply | Qty: 20 | Fill #0

## 2020-07-22 ENCOUNTER — Other Ambulatory Visit: Payer: Self-pay

## 2020-07-22 ENCOUNTER — Telehealth: Payer: Self-pay | Admitting: Family Medicine

## 2020-07-22 ENCOUNTER — Encounter: Payer: Self-pay | Admitting: Family Medicine

## 2020-07-22 ENCOUNTER — Ambulatory Visit: Payer: 59 | Admitting: Family Medicine

## 2020-07-22 DIAGNOSIS — M2242 Chondromalacia patellae, left knee: Secondary | ICD-10-CM | POA: Diagnosis not present

## 2020-07-22 NOTE — Assessment & Plan Note (Signed)
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  I do think that this is an exacerbation of an underlying problem.  For the possibility of restarting allopurinol with signs noted on mild ultrasound scanning today.  Discussed with patient about topical anti-inflammatories that I think will be beneficial in avoiding oral anti-inflammatories.  Patient will start work again next week for 20 hours first week, 30 hours the second week 40 hours thereafter.

## 2020-07-22 NOTE — Progress Notes (Signed)
[facility] [address] [place] Phone: [phone] Subjective:   I [name] [name] am serving as a Education administrator for Dr. Hulan Saas.  This visit occurred during the SARS-CoV-2 public health emergency.  Safety protocols were in place, including screening questions prior to the visit, additional usage of staff PPE, and extensive cleaning of exam room while observing appropriate contact time as indicated for disinfecting solutions.   I'm seeing this patient by the request  of:  Orma Flaming, MD  CC: Low back pain follow-up, new knee pain  ASN:[id number]   05/27/2020 Chronic thumb with worsening symptoms.  Patient given Toradol and Depo-Medrol today.  This signed up again for another epidural which patient has responded to well in the past.  Discussed the underlying potential for gallops continuing the allopurinol for now.  We discussed the possibility of using colchicine instead of the allopurinol for more burst increase activity slowly.  Follow-up again 8 to 10 weeks  Update 07/22/2020 [name] [name] is a 59 y.o. female coming in with complaint of low back pain. Epidural 07/01/2020. Patient states that the epidural is doing well. Had a hard fall from work about a week ago yesterday. Landed mostly on the left knee. Had xray of the knee. Walked on crutches the first 2 days after her injury. Taking pain meds. Weight bearing is painful as well as flexion and extension with weight bearing. States the knee throbs. Patient would like an ultrasound. Hit the right knee as well.    Left knee xray were taken at health at work and were independently visualized by me. IMPRESSION: 1. Degenerative changes in the medial compartment. No acute bony abnormality. 2. Chondrocalcinosis.    Past Medical History:  Diagnosis Date  . Cerebral aneurysm 1999   Surgically coiled  . DDD (degenerative disc disease)   . GERD (gastroesophageal reflux disease)   . History of  Barrett's esophagus   . Hypercholesterolemia   . Hypertension   . Irritable bowel disease   . Sinus disease    Past Surgical History:  Procedure Laterality Date  . ABDOMINAL HYSTERECTOMY  2005  . APPENDECTOMY    . ARTHROTOMY Right 12/11/2015   Procedure: OPEN ARTHROTOMY ;  Surgeon: Roseanne Kaufman, MD;  Location: [facility];  Service: Orthopedics;  Laterality: Right;  . AUGMENTATION MAMMAPLASTY Bilateral   . BREAST ENHANCEMENT SURGERY    . CEREBRAL ANEURYSM REPAIR  1999  . DIAGNOSTIC LAPAROSCOPY    . LUMBAR DISC SURGERY Left 06/03/2010   Procedure: ANTEROLATERAL DECOMPRESSION AND ARTHRODESIS L3-4, L4-5  . NASAL SINUS SURGERY  2013  . ORIF ANKLE FRACTURE  03/01/2012   Procedure: OPEN REDUCTION INTERNAL FIXATION (ORIF) ANKLE FRACTURE;  Surgeon: Wylene Simmer, MD;  Location: [facility];  Service: Orthopedics;  Laterality: Right;  Open reduction internal fixation right navicular fracture  . REFRACTIVE SURGERY    . SHOULDER ARTHROSCOPY Left   . SYNOVECTOMY Right 12/11/2015   Procedure: SYNOVECTOMY DISTAL RADIOULNAR JOINT LOOSE BODY REMOVAL ;  Surgeon: Roseanne Kaufman, MD;  Location: [facility];  Service: Orthopedics;  Laterality: Right;  . WRIST ARTHROSCOPY Right 12/11/2015   Procedure: RIGHT WRIST ARTHROSCOPY WITH DEBRIDEMENT ;  Surgeon: Roseanne Kaufman, MD;  Location: [facility];  Service: Orthopedics;  Laterality: Right;   Social History   Socioeconomic History  . Marital status: Divorced    Spouse name: Not on file  . Number of children: Not on file  . Years  of education: Not on file  . Highest education level: Not on file  Occupational History    Employer: [employer]  Tobacco Use  . Smoking status: Former Smoker    Quit date: 11/14/1984    Years since quitting: 35.7  . Smokeless tobacco: Never Used  Substance and Sexual Activity  . Alcohol use: Yes    Comment: social  . Drug use: No  . Sexual activity: Not on file   Other Topics Concern  . Not on file  Social History Narrative   The patient works as a Marine scientist in the electrophysiology department. Her daughter and 63-monthold granddaughter live with her. She does not smoke cigarettes.   Social Determinants of Health   Financial Resource Strain:   . Difficulty of Paying Living Expenses: Not on file  Food Insecurity:   . Worried About RCharity fundraiserin the Last Year: Not on file  . Ran Out of Food in the Last Year: Not on file  Transportation Needs:   . Lack of Transportation (Medical): Not on file  . Lack of Transportation (Non-Medical): Not on file  Physical Activity:   . Days of Exercise per Week: Not on file  . Minutes of Exercise per Session: Not on file  Stress:   . Feeling of Stress : Not on file  Social Connections:   . Frequency of Communication with Friends and Family: Not on file  . Frequency of Social Gatherings with Friends and Family: Not on file  . Attends Religious Services: Not on file  . Active Member of Clubs or Organizations: Not on file  . Attends CArchivistMeetings: Not on file  . Marital Status: Not on file   Allergies  Allergen Reactions  . Hydrocodone-Acetaminophen Itching  . Lipitor [Atorvastatin Calcium] Other (See Comments)    Elevated liver enzymes  . Morphine And Related Nausea Only  . Tetracyclines & Related Rash    Break out on all extremties   Family History  Problem Relation Age of Onset  . Hypertension Father   . Hypertension Mother   . Osteopenia Mother   . Osteopenia Maternal Grandmother      Current Outpatient Medications (Cardiovascular):  .Marland Kitchen Alirocumab (PRALUENT) 150 MG/ML SOAJ, Inject 150 mg as directed every 14 (fourteen) days. .Marland Kitchen atenolol (TENORMIN) 50 MG tablet, TAKE 1 TABLET (50 MG TOTAL) BY MOUTH AT BEDTIME. .  hydrochlorothiazide (HYDRODIURIL) 25 MG tablet, Take 1 tablet (25 mg total) by mouth daily. .Marland Kitchen losartan (COZAAR) 100 MG tablet, TAKE 1 TABLET BY MOUTH DAILY. .Marland Kitchen  Pitavastatin Calcium (LIVALO) 1 MG TABS, Take 1 tablet (1 mg total) by mouth daily.   Current Outpatient Medications (Analgesics):  .  allopurinol (ZYLOPRIM) 100 MG tablet, Take 2 tablets (200 mg total) by mouth daily. .  colchicine 0.6 MG tablet, Take 1 tablet (0.6 mg total) by mouth daily.   Current Outpatient Medications (Other):  .Marland Kitchen Calcium Citrate-Vitamin D 315-250 MG-UNIT TABS, Take 1 tablet by mouth 2 (two) times daily. .  MULTIPLE VITAMINS-MINERALS PO, Take by mouth. .  Omega-3 Fatty Acids (FISH OIL) 1000 MG CAPS, Take 1,400 mg by mouth.  .  pantoprazole (PROTONIX) 40 MG tablet, Take 1 tablet (40 mg total) by mouth daily. .  valACYclovir (VALTREX) 500 MG tablet, Take 500 mg by mouth 2 (two) times daily.   Reviewed prior external information including notes and imaging from  primary care provider As well as notes that were available from care  everywhere and other healthcare systems.  Past medical history, social, surgical and family history all reviewed in electronic medical record.  No pertanent information unless stated regarding to the chief complaint.   Review of Systems:  No headache, visual changes, nausea, vomiting, diarrhea, constipation, dizziness, abdominal pain, skin rash, fevers, chills, night sweats, weight loss, swollen lymph nodes, body aches, joint swelling, chest pain, shortness of breath, mood changes. POSITIVE muscle aches  Objective  Blood pressure 104/84, pulse 74, height 5' 9"  (1.753 m), weight 170 lb (77.1 kg), SpO2 98 %.   General: No apparent distress alert and oriented x3 mood and affect normal, dressed appropriately.  HEENT: Pupils equal, extraocular movements intact  Respiratory: Patient's speak in full sentences and does not appear short of breath  Cardiovascular: No lower extremity edema, non tender, no erythema  Neuro: Cranial nerves II through XII are intact, neurovascularly intact in all extremities with 2+ DTRs and 2+ pulses.  Gait normal with  good balance and coordination.  MSK: Patient's left knee has what appears to be a small contusion noted of the patella still noted.  Patient also has a contusion of the tibial area approximately.  Patient is severely tender over the medial joint space.  Does have full range of motion may be lacking the last 5 degrees of extension.  ACL and PCL does appear to be intact.   Limited musculoskeletal ultrasound was performed and interpreted by Lyndal Pulley  Limited ultrasound of patient's knee shows that there is only a contusion of the patella noted.  No significant abnormality of the tibial plateau noted at this time the patient is severely tender in this area.  Mild to moderate arthritic changes noted of the medial and patellofemoral joint with no effusion.   Impression and Recommendations:     The above documentation has been reviewed and is accurate and complete Lyndal Pulley, DO       Note: This dictation was prepared with Dragon dictation along with smaller phrase technology. Any transcriptional errors that result from this process are unintentional.

## 2020-07-22 NOTE — Telephone Encounter (Signed)
Patient states she has called [facility] multiple times to schedule an appointment with them she has left messages with camille, jennifer and the other schedules over there with 3 separate individuals, and patient has not heard anything back regarding scheduling and she just asked if there was something we can do to figure out why they are not calling her back

## 2020-07-22 NOTE — Patient Instructions (Signed)
Good to see you Starting Monday 20 hours first week 30 hours 2nd week 40 hours/ full duty after Voltaren gel topically  Ice after activity See me again in 4-6 weeks if not perfect. If pain worsens call for MRI

## 2020-07-23 NOTE — Telephone Encounter (Signed)
Spoke with [facility]. They are going to contact the patient today to get scheduled.

## 2020-07-23 NOTE — Telephone Encounter (Signed)
[facility] has been training a new referral coordinator Rosendo Gros), so they are very behind on referrals right now. I will call to see if they can expedite the process in getting the pt scheduled.

## 2020-07-24 NOTE — Telephone Encounter (Signed)
Thank you [name] [name]! aw

## 2020-07-27 DIAGNOSIS — M9903 Segmental and somatic dysfunction of lumbar region: Secondary | ICD-10-CM | POA: Diagnosis not present

## 2020-07-27 DIAGNOSIS — S335XXA Sprain of ligaments of lumbar spine, initial encounter: Secondary | ICD-10-CM | POA: Diagnosis not present

## 2020-07-28 DIAGNOSIS — S335XXA Sprain of ligaments of lumbar spine, initial encounter: Secondary | ICD-10-CM | POA: Diagnosis not present

## 2020-07-28 DIAGNOSIS — M9903 Segmental and somatic dysfunction of lumbar region: Secondary | ICD-10-CM | POA: Diagnosis not present

## 2020-07-29 DIAGNOSIS — H5203 Hypermetropia, bilateral: Secondary | ICD-10-CM | POA: Diagnosis not present

## 2020-07-29 DIAGNOSIS — S335XXA Sprain of ligaments of lumbar spine, initial encounter: Secondary | ICD-10-CM | POA: Diagnosis not present

## 2020-07-29 DIAGNOSIS — M9903 Segmental and somatic dysfunction of lumbar region: Secondary | ICD-10-CM | POA: Diagnosis not present

## 2020-08-03 DIAGNOSIS — M9903 Segmental and somatic dysfunction of lumbar region: Secondary | ICD-10-CM | POA: Diagnosis not present

## 2020-08-03 DIAGNOSIS — S335XXA Sprain of ligaments of lumbar spine, initial encounter: Secondary | ICD-10-CM | POA: Diagnosis not present

## 2020-08-04 DIAGNOSIS — M9903 Segmental and somatic dysfunction of lumbar region: Secondary | ICD-10-CM | POA: Diagnosis not present

## 2020-08-04 DIAGNOSIS — S335XXA Sprain of ligaments of lumbar spine, initial encounter: Secondary | ICD-10-CM | POA: Diagnosis not present

## 2020-08-05 ENCOUNTER — Telehealth: Payer: Self-pay | Admitting: Family Medicine

## 2020-08-05 DIAGNOSIS — M9903 Segmental and somatic dysfunction of lumbar region: Secondary | ICD-10-CM | POA: Diagnosis not present

## 2020-08-05 DIAGNOSIS — S335XXA Sprain of ligaments of lumbar spine, initial encounter: Secondary | ICD-10-CM | POA: Diagnosis not present

## 2020-08-05 NOTE — Telephone Encounter (Signed)
Patient called stating that she is supposed to return to full duty next week at her job but she does not know if she is ready to work without restrictions. She said that her knee is still giving her a lot of trouble and she is concerned. She asked if she should be seen again or what Dr Tamala Julian would suggest?

## 2020-08-05 NOTE — Telephone Encounter (Signed)
Patient notified via MyChart that letter was written and sent through [facility].

## 2020-08-10 DIAGNOSIS — S335XXA Sprain of ligaments of lumbar spine, initial encounter: Secondary | ICD-10-CM | POA: Diagnosis not present

## 2020-08-10 DIAGNOSIS — M9903 Segmental and somatic dysfunction of lumbar region: Secondary | ICD-10-CM | POA: Diagnosis not present

## 2020-08-10 MED FILL — PRALUENT 150 MG/ML SOAJ: 150 | 28 days supply | Qty: 2 | Fill #8

## 2020-08-12 DIAGNOSIS — S335XXA Sprain of ligaments of lumbar spine, initial encounter: Secondary | ICD-10-CM | POA: Diagnosis not present

## 2020-08-12 DIAGNOSIS — K649 Unspecified hemorrhoids: Secondary | ICD-10-CM | POA: Diagnosis not present

## 2020-08-12 DIAGNOSIS — M9903 Segmental and somatic dysfunction of lumbar region: Secondary | ICD-10-CM | POA: Diagnosis not present

## 2020-08-14 ENCOUNTER — Telehealth: Payer: Self-pay | Admitting: Internal Medicine

## 2020-08-14 NOTE — Telephone Encounter (Signed)
Prior authorization for Praluent submitted via fax to [facility] @ [phone]

## 2020-08-17 ENCOUNTER — Other Ambulatory Visit: Payer: Self-pay | Admitting: Family Medicine

## 2020-08-17 DIAGNOSIS — M9903 Segmental and somatic dysfunction of lumbar region: Secondary | ICD-10-CM | POA: Diagnosis not present

## 2020-08-17 DIAGNOSIS — S335XXA Sprain of ligaments of lumbar spine, initial encounter: Secondary | ICD-10-CM | POA: Diagnosis not present

## 2020-08-17 MED FILL — ATENOLOL 50 MG TABLET: 50 | 90 days supply | Qty: 90 | Fill #0

## 2020-08-19 DIAGNOSIS — S335XXA Sprain of ligaments of lumbar spine, initial encounter: Secondary | ICD-10-CM | POA: Diagnosis not present

## 2020-08-19 DIAGNOSIS — M9903 Segmental and somatic dysfunction of lumbar region: Secondary | ICD-10-CM | POA: Diagnosis not present

## 2020-08-19 NOTE — Telephone Encounter (Signed)
praluent approved 08/18/2020 - 08/17/2021

## 2020-08-25 DIAGNOSIS — S335XXA Sprain of ligaments of lumbar spine, initial encounter: Secondary | ICD-10-CM | POA: Diagnosis not present

## 2020-08-25 DIAGNOSIS — M9903 Segmental and somatic dysfunction of lumbar region: Secondary | ICD-10-CM | POA: Diagnosis not present

## 2020-08-26 ENCOUNTER — Ambulatory Visit: Payer: 59 | Admitting: Family Medicine

## 2020-08-26 DIAGNOSIS — D2261 Melanocytic nevi of right upper limb, including shoulder: Secondary | ICD-10-CM | POA: Diagnosis not present

## 2020-08-26 DIAGNOSIS — L820 Inflamed seborrheic keratosis: Secondary | ICD-10-CM | POA: Diagnosis not present

## 2020-08-26 DIAGNOSIS — D2262 Melanocytic nevi of left upper limb, including shoulder: Secondary | ICD-10-CM | POA: Diagnosis not present

## 2020-08-26 DIAGNOSIS — C44311 Basal cell carcinoma of skin of nose: Secondary | ICD-10-CM | POA: Diagnosis not present

## 2020-08-26 DIAGNOSIS — L821 Other seborrheic keratosis: Secondary | ICD-10-CM | POA: Diagnosis not present

## 2020-08-26 DIAGNOSIS — D225 Melanocytic nevi of trunk: Secondary | ICD-10-CM | POA: Diagnosis not present

## 2020-08-26 DIAGNOSIS — L814 Other melanin hyperpigmentation: Secondary | ICD-10-CM | POA: Diagnosis not present

## 2020-08-27 ENCOUNTER — Ambulatory Visit: Payer: 59 | Admitting: Family Medicine

## 2020-08-27 ENCOUNTER — Other Ambulatory Visit: Payer: Self-pay

## 2020-08-27 VITALS — BP 118/90 | HR 96 | Ht 69.0 in | Wt 171.0 lb

## 2020-08-27 DIAGNOSIS — M2550 Pain in unspecified joint: Secondary | ICD-10-CM | POA: Diagnosis not present

## 2020-08-27 DIAGNOSIS — M2242 Chondromalacia patellae, left knee: Secondary | ICD-10-CM

## 2020-08-27 DIAGNOSIS — M4807 Spinal stenosis, lumbosacral region: Secondary | ICD-10-CM | POA: Diagnosis not present

## 2020-08-27 LAB — COMPREHENSIVE METABOLIC PANEL
ALT: 40 U/L — ABNORMAL HIGH (ref 0–35)
AST: 42 U/L — ABNORMAL HIGH (ref 0–37)
Albumin: 4.7 g/dL (ref 3.5–5.2)
Alkaline Phosphatase: 66 U/L (ref 39–117)
BUN: 25 mg/dL — ABNORMAL HIGH (ref 6–23)
CO2: 30 mEq/L (ref 19–32)
Calcium: 9.8 mg/dL (ref 8.4–10.5)
Chloride: 97 mEq/L (ref 96–112)
Creatinine, Ser: 0.59 mg/dL (ref 0.40–1.20)
GFR: 98.79 mL/min (ref >60.00)
Glucose, Bld: 97 mg/dL (ref 70–99)
Potassium: 3.3 mEq/L — ABNORMAL LOW (ref 3.5–5.1)
Sodium: 138 mEq/L (ref 135–145)
Total Bilirubin: 0.8 mg/dL (ref 0.2–1.2)
Total Protein: 7.6 g/dL (ref 6.0–8.3)

## 2020-08-27 LAB — FERRITIN: Ferritin: 159.5 ng/mL (ref 10.0–291.0)

## 2020-08-27 LAB — IBC PANEL
Iron: 85 ug/dL (ref 42–145)
Saturation Ratios: 20.9 % (ref 20.0–50.0)
Transferrin: 291 mg/dL (ref 212.0–360.0)

## 2020-08-27 LAB — CBC WITH DIFFERENTIAL/PLATELET
Basophils Absolute: 0 10*3/uL (ref 0.0–0.1)
Basophils Relative: 0.5 % (ref 0.0–3.0)
Eosinophils Absolute: 0 10*3/uL (ref 0.0–0.7)
Eosinophils Relative: 0.9 % (ref 0.0–5.0)
HCT: 37.3 % (ref 36.0–46.0)
Hemoglobin: 12.8 g/dL (ref 12.0–15.0)
Lymphocytes Relative: 34.8 % (ref 12.0–46.0)
Lymphs Abs: 2 10*3/uL (ref 0.7–4.0)
MCHC: 34.4 g/dL (ref 30.0–36.0)
MCV: 98.4 fl (ref 78.0–100.0)
Monocytes Absolute: 0.6 10*3/uL (ref 0.1–1.0)
Monocytes Relative: 9.8 % (ref 3.0–12.0)
Neutro Abs: 3.1 10*3/uL (ref 1.4–7.7)
Neutrophils Relative %: 54 % (ref 43.0–77.0)
Platelets: 232 10*3/uL (ref 150.0–400.0)
RBC: 3.79 Mil/uL — ABNORMAL LOW (ref 3.87–5.11)
RDW: 12.3 % (ref 11.5–15.5)
WBC: 5.7 10*3/uL (ref 4.0–10.5)

## 2020-08-27 LAB — URIC ACID: Uric Acid, Serum: 4.6 mg/dL (ref 2.4–7.0)

## 2020-08-27 LAB — SEDIMENTATION RATE: Sed Rate: 12 mm/hr (ref 0–30)

## 2020-08-27 LAB — VITAMIN B12: Vitamin B-12: 1091 pg/mL — ABNORMAL HIGH (ref 211–911)

## 2020-08-27 LAB — VITAMIN D 25 HYDROXY (VIT D DEFICIENCY, FRACTURES): VITD: 43.03 ng/mL (ref 30.00–100.00)

## 2020-08-27 LAB — C-REACTIVE PROTEIN: CRP: <1 mg/dL (ref 0.5–20.0)

## 2020-08-27 LAB — TSH: TSH: 1.94 u[IU]/mL (ref 0.35–4.50)

## 2020-08-27 NOTE — Patient Instructions (Addendum)
Labs today MRI left knee Starting Monday 20 hours a week max for the next 4 weeks See me again in 4 weeks

## 2020-08-27 NOTE — Progress Notes (Signed)
[place] Phone: [phone] Subjective:   [name] [name], am serving as a scribe for Dr. Hulan Saas. This visit occurred during the SARS-CoV-2 public health emergency.  Safety protocols were in place, including screening questions prior to the visit, additional usage of staff PPE, and extensive cleaning of exam room while observing appropriate contact time as indicated for disinfecting solutions.   I'm seeing this patient by the request  of:  Orma Flaming, MD  CC: Left knee pain follow-up  MPN:[id number]   07/22/2020 Patient has known degenerative changes of the medial and patellofemoral joint.  I do think that this is an exacerbation of an underlying problem.  For the possibility of restarting allopurinol with signs noted on mild ultrasound scanning today.  Discussed with patient about topical anti-inflammatories that I think will be beneficial in avoiding oral anti-inflammatories.  Patient will start work again next week for 20 hours first week, 30 hours the second week 40 hours thereafter.  Update 08/27/2020 [name] [name] is a 59 y.o. female coming in with complaint of left patellofemoral joint pain. Patient states that she has not had any new injury since last visit. Patient feels like her knee pain is improving. Did get worse before it has started to improve. Sit to stand continues to cause sharp pain especially with longer periods on her feet.  Patient states that it affects work.  Patient was to do full duty this week and finds it more difficult.  Feels like she actually even with sitting has lost some range of motion of the knee.  Having a hard time gripping with left hand. Pain begins in wrist and radiates into the hand. History of surgery on right wrist.   Also has swelling in 2nd and 3rd PIP joints for past 2-3 weeks. Tightness makes it hard to grip.   Has tried colchicine which she did not notice any  difference. Used allopurinol but discontinued use due to a med interaction that caused leg cramps.  Continues to have back pain fairly regularly as well with known degenerative disc disease.      Past Medical History:  Diagnosis Date  . Cerebral aneurysm 1999   Surgically coiled  . DDD (degenerative disc disease)   . GERD (gastroesophageal reflux disease)   . History of Barrett's esophagus   . Hypercholesterolemia   . Hypertension   . Irritable bowel disease   . Sinus disease    Past Surgical History:  Procedure Laterality Date  . ABDOMINAL HYSTERECTOMY  2005  . APPENDECTOMY    . ARTHROTOMY Right 12/11/2015   Procedure: OPEN ARTHROTOMY ;  Surgeon: Roseanne Kaufman, MD;  Location: [facility];  Service: Orthopedics;  Laterality: Right;  . AUGMENTATION MAMMAPLASTY Bilateral   . BREAST ENHANCEMENT SURGERY    . CEREBRAL ANEURYSM REPAIR  1999  . DIAGNOSTIC LAPAROSCOPY    . LUMBAR DISC SURGERY Left 06/03/2010   Procedure: ANTEROLATERAL DECOMPRESSION AND ARTHRODESIS L3-4, L4-5  . NASAL SINUS SURGERY  2013  . ORIF ANKLE FRACTURE  03/01/2012   Procedure: OPEN REDUCTION INTERNAL FIXATION (ORIF) ANKLE FRACTURE;  Surgeon: Wylene Simmer, MD;  Location: [facility];  Service: Orthopedics;  Laterality: Right;  Open reduction internal fixation right navicular fracture  . REFRACTIVE SURGERY    . SHOULDER ARTHROSCOPY Left   . SYNOVECTOMY Right 12/11/2015   Procedure: SYNOVECTOMY DISTAL RADIOULNAR JOINT LOOSE BODY REMOVAL ;  Surgeon: Roseanne Kaufman, MD;  Location:  [facility];  Service: Orthopedics;  Laterality: Right;  . WRIST ARTHROSCOPY Right 12/11/2015   Procedure: RIGHT WRIST ARTHROSCOPY WITH DEBRIDEMENT ;  Surgeon: Roseanne Kaufman, MD;  Location: [facility];  Service: Orthopedics;  Laterality: Right;   Social History   Socioeconomic History  . Marital status: Divorced    Spouse name: Not on file  . Number of children: Not on file  .  Years of education: Not on file  . Highest education level: Not on file  Occupational History    Employer: [employer]  Tobacco Use  . Smoking status: Former Smoker    Quit date: 11/14/1984    Years since quitting: 35.8  . Smokeless tobacco: Never Used  Substance and Sexual Activity  . Alcohol use: Yes    Comment: social  . Drug use: [name]  . Sexual activity: Not on file  Other Topics Concern  . Not on file  Social History Narrative   The patient works as a Marine scientist in the electrophysiology department. Her daughter and 34-monthold granddaughter live with her. She does not smoke cigarettes.   Social Determinants of Health   Financial Resource Strain:   . Difficulty of Paying Living Expenses: Not on file  Food Insecurity:   . Worried About RCharity fundraiserin the Last Year: Not on file  . Ran Out of Food in the Last Year: Not on file  Transportation Needs:   . Lack of Transportation (Medical): Not on file  . Lack of Transportation (Non-Medical): Not on file  Physical Activity:   . Days of Exercise per Week: Not on file  . Minutes of Exercise per Session: Not on file  Stress:   . Feeling of Stress : Not on file  Social Connections:   . Frequency of Communication with Friends and Family: Not on file  . Frequency of Social Gatherings with Friends and Family: Not on file  . Attends Religious Services: Not on file  . Active Member of Clubs or Organizations: Not on file  . Attends CArchivistMeetings: Not on file  . Marital Status: Not on file   Allergies  Allergen Reactions  . Hydrocodone-Acetaminophen Itching  . Lipitor [Atorvastatin Calcium] Other (See Comments)    Elevated liver enzymes  . Morphine And Related Nausea Only  . Tetracyclines & Related Rash    Break out on all extremties   Family History  Problem Relation Age of Onset  . Hypertension Father   . Hypertension Mother   . Osteopenia Mother   . Osteopenia Maternal Grandmother      Current  Outpatient Medications (Cardiovascular):  .Marland Kitchen Alirocumab (PRALUENT) 150 MG/ML SOAJ, Inject 150 mg as directed every 14 (fourteen) days. .Marland Kitchen atenolol (TENORMIN) 50 MG tablet, TAKE 1 TABLET (50 MG TOTAL) BY MOUTH AT BEDTIME. .  hydrochlorothiazide (HYDRODIURIL) 25 MG tablet, Take 1 tablet (25 mg total) by mouth daily. .Marland Kitchen losartan (COZAAR) 100 MG tablet, TAKE 1 TABLET BY MOUTH DAILY. .Marland Kitchen Pitavastatin Calcium (LIVALO) 1 MG TABS, Take 1 tablet (1 mg total) by mouth daily.   Current Outpatient Medications (Analgesics):  .  allopurinol (ZYLOPRIM) 100 MG tablet, Take 2 tablets (200 mg total) by mouth daily. .  colchicine 0.6 MG tablet, Take 1 tablet (0.6 mg total) by mouth daily.   Current Outpatient Medications (Other):  .Marland Kitchen Calcium Citrate-Vitamin D 315-250 MG-UNIT TABS, Take 1 tablet by mouth 2 (two) times daily. .  MULTIPLE VITAMINS-MINERALS PO, Take  by mouth. .  Omega-3 Fatty Acids (FISH OIL) 1000 MG CAPS, Take 1,400 mg by mouth.  .  pantoprazole (PROTONIX) 40 MG tablet, Take 1 tablet (40 mg total) by mouth daily. .  valACYclovir (VALTREX) 500 MG tablet, Take 500 mg by mouth 2 (two) times daily.   Reviewed prior external information including notes and imaging from  primary care provider As well as notes that were available from care everywhere and other healthcare systems.  Past medical history, social, surgical and family history all reviewed in electronic medical record.  [name] pertanent information unless stated regarding to the chief complaint.   Review of Systems:  [name] headache, visual changes, nausea, vomiting, diarrhea, constipation, dizziness, abdominal pain, skin rash, fevers, chills, night sweats, weight loss, swollen lymph nodes, chest pain, shortness of breath, mood changes. POSITIVE muscle aches, body aches, joint swelling  Objective  Blood pressure 118/90, pulse 96, height 5' 9"  (1.753 m), weight 171 lb (77.6 kg), SpO2 97 %.   General: Patient appears moderately anxious and  tearful HEENT: Pupils equal, extraocular movements intact  Respiratory: Patient's speak in full sentences and does not appear short of breath  Cardiovascular: [name] lower extremity edema, non tender, [name] erythema patient does have swelling of the DIP joints on the right hand.  Mostly fingers 2 3 and 4 Gait mildly antalgic MSK:  Left knee only has 90 degrees of flexion but there is voluntary guarding.  Mild positive McMurray's.  Severely positive patellar grind test.  Does have full extension..  Patient is diffusely tender in multiple different muscles on exam today.   Impression and Recommendations:     The above documentation has been reviewed and is accurate and complete [name] Pulley, DO

## 2020-08-27 NOTE — Assessment & Plan Note (Signed)
Patient's knee pain is out of proportion.  Patient has decrease in range of motion.  At this point we need to rule out an OCD or an occult fracture that could be contributing.  X-rays from August did not show anything other than moderate arthritic changes.  Ultrasound showed more of a pseudogout.  Would get a repeat x-ray today but secondary to broken and need we will go for MRI especially because it is affecting daily activities and her job performance.  20 hours max at work for the next 4 weeks while we figure this out.  Follow-up with me again in 4 weeks

## 2020-08-27 NOTE — Assessment & Plan Note (Signed)
Patient is having multiple different musculoskeletal complaints over the course of time and I do think laboratory work-up is necessary at this time to rule out autoimmune.  Patient has had difficulty before to with multiple different things and I would like to see patient liver enzymes.  Encourage patient also to follow-up with primary care provider after his work-up to see if anything else could be contributing.  Patient is concerned secondary to all these different problems about her long-term working with patients.

## 2020-08-28 ENCOUNTER — Encounter: Payer: Self-pay | Admitting: Family Medicine

## 2020-08-28 NOTE — Assessment & Plan Note (Signed)
Patient has had MRI of the back showing spinal stenosis and has over the course of 2 years has had for epidurals.  Last 14 June 2020.  Patient feels the knee is more important at this point

## 2020-08-30 LAB — CYCLIC CITRUL PEPTIDE ANTIBODY, IGG: Cyclic Citrullin Peptide Ab: <16 UNITS

## 2020-08-30 LAB — ANGIOTENSIN CONVERTING ENZYME: Angiotensin-Converting Enzyme: 34 U/L (ref 9–67)

## 2020-08-30 LAB — CEA: CEA: <0.5 ng/mL

## 2020-08-30 LAB — ANTI-NUCLEAR AB-TITER (ANA TITER): ANA Titer 1: 1:40 {titer} — ABNORMAL HIGH

## 2020-08-30 LAB — RHEUMATOID FACTOR: Rheumatoid fact SerPl-aCnc: <14 IU/mL (ref <14)

## 2020-08-30 LAB — PTH, INTACT AND CALCIUM
Calcium: 10.1 mg/dL (ref 8.6–10.4)
PTH: 28 pg/mL (ref 14–64)

## 2020-08-30 LAB — CALCIUM, IONIZED: Calcium, Ion: 4.8 mg/dL (ref 4.8–5.6)

## 2020-08-30 LAB — CA 125: CA 125: 12 U/mL (ref <35)

## 2020-08-30 LAB — ANA: Anti Nuclear Antibody (ANA): POSITIVE — AB

## 2020-08-31 DIAGNOSIS — M9903 Segmental and somatic dysfunction of lumbar region: Secondary | ICD-10-CM | POA: Diagnosis not present

## 2020-08-31 DIAGNOSIS — S335XXA Sprain of ligaments of lumbar spine, initial encounter: Secondary | ICD-10-CM | POA: Diagnosis not present

## 2020-09-07 MED FILL — PRALUENT 150 MG/ML SOAJ: 150 | 28 days supply | Qty: 2 | Fill #9

## 2020-09-08 ENCOUNTER — Other Ambulatory Visit: Payer: Self-pay | Admitting: Family Medicine

## 2020-09-08 DIAGNOSIS — Z1231 Encounter for screening mammogram for malignant neoplasm of breast: Secondary | ICD-10-CM

## 2020-09-09 DIAGNOSIS — S335XXA Sprain of ligaments of lumbar spine, initial encounter: Secondary | ICD-10-CM | POA: Diagnosis not present

## 2020-09-09 DIAGNOSIS — M9903 Segmental and somatic dysfunction of lumbar region: Secondary | ICD-10-CM | POA: Diagnosis not present

## 2020-09-10 ENCOUNTER — Other Ambulatory Visit (HOSPITAL_COMMUNITY)
Admission: RE | Admit: 2020-09-10 | Discharge: 2020-09-10 | Disposition: A | Discharge status: Home / Self Care | Payer: 59 | Source: Ambulatory Visit | Attending: Surgery | Admitting: Surgery

## 2020-09-10 DIAGNOSIS — Z01812 Encounter for preprocedural laboratory examination: Secondary | ICD-10-CM | POA: Diagnosis not present

## 2020-09-10 DIAGNOSIS — Z20822 Contact with and (suspected) exposure to covid-19: Secondary | ICD-10-CM | POA: Insufficient documentation

## 2020-09-10 LAB — SARS CORONAVIRUS 2 (TAT 6-24 HRS): SARS Coronavirus 2: NEGATIVE

## 2020-09-14 ENCOUNTER — Ambulatory Visit (HOSPITAL_COMMUNITY)
Admission: RE | Admit: 2020-09-14 | Discharge: 2020-09-14 | Disposition: A | Discharge status: Home / Self Care | Payer: 59 | Attending: Surgery | Admitting: Surgery

## 2020-09-14 ENCOUNTER — Encounter (HOSPITAL_COMMUNITY)
Admission: RE | Disposition: A | Discharge status: Home / Self Care | Payer: Self-pay | Source: Home / Self Care | Attending: Surgery

## 2020-09-14 DIAGNOSIS — N8184 Pelvic muscle wasting: Secondary | ICD-10-CM | POA: Insufficient documentation

## 2020-09-14 HISTORY — PX: ANAL RECTAL MANOMETRY: SHX6358

## 2020-09-14 SURGERY — MANOMETRY, ANORECTAL

## 2020-09-14 NOTE — Progress Notes (Signed)
Anal manometry done per protocol. Pt tolerated well without distress or complication. Expulsion test performed , balloon expelled after 1 min 20 sec.

## 2020-09-16 ENCOUNTER — Encounter (HOSPITAL_COMMUNITY): Payer: Self-pay | Admitting: Surgery

## 2020-09-21 ENCOUNTER — Ambulatory Visit
Admission: RE | Admit: 2020-09-21 | Discharge: 2020-09-21 | Disposition: A | Discharge status: Home / Self Care | Payer: 59 | Source: Ambulatory Visit | Attending: Family Medicine | Admitting: Family Medicine

## 2020-09-21 DIAGNOSIS — S83242A Other tear of medial meniscus, current injury, left knee, initial encounter: Secondary | ICD-10-CM | POA: Diagnosis not present

## 2020-09-21 DIAGNOSIS — M2550 Pain in unspecified joint: Secondary | ICD-10-CM

## 2020-09-22 NOTE — Progress Notes (Signed)
[place] [address] [place] Phone: [phone] Subjective:   I [name] am serving as a Education administrator for Dr. Hulan Saas.  This visit occurred during the SARS-CoV-2 public health emergency.  Safety protocols were in place, including screening questions prior to the visit, additional usage of staff PPE, and extensive cleaning of exam room while observing appropriate contact time as indicated for disinfecting solutions.   I'm seeing this patient by the request  of:  Orma Flaming, MD  CC: Left knee pain  WIO:[id number]      Update 09/22/2020 [name] [name] is a 59 y.o. female coming in with complaint of back and left knee pain. Patient states she is here to talk about next steps for the knee. Back is not doing great.  Patient's knee continues to give her trouble.  Patient initially had the injury August 31 when she fell at work.  Was seen by another provider.  Was not making significant progress and we had patient stay out of work for 10 days.  Patient was then started on a gradual increase with work and when she went to full-term she unfortunately had too much pain with standing and was unable to tolerate it.  Patient at that point then sent for MRI.  MRI was independently visualized by me.  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  Patient states that she can ambulated fine but if she is on her knee greater than 5 hours it seems to cause more discomfort again.  Patient states if she stays off of it seems to do a little better.  MRI left knee 09/21/2020 IMPRESSION: Nondisplaced tear of the posterior medial meniscus. No parameniscal cyst.  Intact cruciate ligaments. 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 No osseous fracture.  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 Cerebral aneurysm 1999   Surgically coiled  . DDD (degenerative disc disease)   . GERD (gastroesophageal reflux disease)   . History of Barrett's esophagus   . Hypercholesterolemia   . Hypertension   . Irritable bowel disease   . Sinus disease    Past Surgical History:  Procedure Laterality Date  . ABDOMINAL HYSTERECTOMY  2005  . ANAL RECTAL MANOMETRY N/A 09/14/2020   Procedure: ANO RECTAL MANOMETRY;  Surgeon: Ileana Roup, MD;  Location: [facility];  Service: General;  Laterality: N/A;  . APPENDECTOMY    . ARTHROTOMY Right 12/11/2015   Procedure: OPEN ARTHROTOMY ;  Surgeon: Roseanne Kaufman, MD;  Location: [facility];  Service: Orthopedics;  Laterality: Right;  . AUGMENTATION MAMMAPLASTY Bilateral   . BREAST ENHANCEMENT SURGERY    . CEREBRAL ANEURYSM REPAIR  1999  . DIAGNOSTIC LAPAROSCOPY    . LUMBAR DISC SURGERY Left 06/03/2010   Procedure: ANTEROLATERAL DECOMPRESSION AND ARTHRODESIS L3-4, L4-5  . NASAL SINUS SURGERY  2013  . ORIF ANKLE FRACTURE  03/01/2012   Procedure: OPEN REDUCTION INTERNAL FIXATION (ORIF) ANKLE FRACTURE;  Surgeon: Wylene Simmer, MD;  Location: [facility];  Service: Orthopedics;  Laterality: Right;  Open reduction internal fixation right navicular fracture  . REFRACTIVE SURGERY    . SHOULDER ARTHROSCOPY Left   . SYNOVECTOMY Right 12/11/2015   Procedure: SYNOVECTOMY DISTAL RADIOULNAR JOINT LOOSE BODY REMOVAL ;  Surgeon: Roseanne Kaufman, MD;  Location: [facility];  Service: Orthopedics;  Laterality: Right;  . WRIST ARTHROSCOPY Right 12/11/2015   Procedure: RIGHT WRIST ARTHROSCOPY WITH DEBRIDEMENT ;  Surgeon: Roseanne Kaufman, MD;  Location: [place] [facility];  Service: Orthopedics;  Laterality: Right;   Social History   Socioeconomic History  . Marital status: Divorced    Spouse name: Not on file  . Number of children: Not on file  . Years of education: Not on file  .  Highest education level: Not on file  Occupational History    Employer: [employer]  Tobacco Use  . Smoking status: Former Smoker    Quit date: 11/14/1984    Years since quitting: 35.8  . Smokeless tobacco: Never Used  Substance and Sexual Activity  . Alcohol use: Yes    Comment: social  . Drug use: No  . Sexual activity: Not on file  Other Topics Concern  . Not on file  Social History Narrative   The patient works as a Marine scientist in the electrophysiology department. Her daughter and 56-monthold granddaughter live with her. She does not smoke cigarettes.   Social Determinants of Health   Financial Resource Strain:   . Difficulty of Paying Living Expenses: Not on file  Food Insecurity:   . Worried About RCharity fundraiserin the Last Year: Not on file  . Ran Out of Food in the Last Year: Not on file  Transportation Needs:   . Lack of Transportation (Medical): Not on file  . Lack of Transportation (Non-Medical): Not on file  Physical Activity:   . Days of Exercise per Week: Not on file  . Minutes of Exercise per Session: Not on file  Stress:   . Feeling of Stress : Not on file  Social Connections:   . Frequency of Communication with Friends and Family: Not on file  . Frequency of Social Gatherings with Friends and Family: Not on file  . Attends Religious Services: Not on file  . Active Member of Clubs or Organizations: Not on file  . Attends CArchivistMeetings: Not on file  . Marital Status: Not on file   Allergies  Allergen Reactions  . Hydrocodone-Acetaminophen Itching  . Lipitor [Atorvastatin Calcium] Other (See Comments)    Elevated liver enzymes  . Morphine And Related Nausea Only  . Tetracyclines & Related Rash    Break out on all extremties   Family History  Problem Relation Age of Onset  . Hypertension Father   . Hypertension Mother   . Osteopenia Mother   . Osteopenia Maternal Grandmother      Current Outpatient Medications (Cardiovascular):  .Marland Kitchen  Alirocumab (PRALUENT) 150 MG/ML SOAJ, Inject 150 mg as directed every 14 (fourteen) days. .Marland Kitchen atenolol (TENORMIN) 50 MG tablet, TAKE 1 TABLET (50 MG TOTAL) BY MOUTH AT BEDTIME. .  hydrochlorothiazide (HYDRODIURIL) 25 MG tablet, Take 1 tablet (25 mg total) by mouth daily. .Marland Kitchen losartan (COZAAR) 100 MG tablet, TAKE 1 TABLET BY MOUTH DAILY. .Marland Kitchen Pitavastatin Calcium (LIVALO) 1 MG TABS, Take 1 tablet (1 mg total) by mouth daily.   Current Outpatient Medications (Analgesics):  .  colchicine 0.6 MG tablet, Take 1 tablet (0.6 mg total) by mouth daily.   Current Outpatient Medications (Other):  .Marland Kitchen Calcium Citrate-Vitamin D 315-250 MG-UNIT TABS, Take 1 tablet by mouth 2 (two) times daily. .  MULTIPLE VITAMINS-MINERALS PO, Take by mouth. .  Omega-3 Fatty Acids (FISH OIL) 1000 MG CAPS, Take 1,400 mg  by mouth.  .  pantoprazole (PROTONIX) 40 MG tablet, Take 1 tablet (40 mg total) by mouth daily. .  valACYclovir (VALTREX) 500 MG tablet, Take 500 mg by mouth 2 (two) times daily.   Reviewed prior external information including notes and imaging from  primary care provider As well as notes that were available from care everywhere and other healthcare systems.  Past medical history, social, surgical and family history all reviewed in electronic medical record.  No pertanent information unless stated regarding to the chief complaint.   Review of Systems:  No headache, visual changes, nausea, vomiting, diarrhea, constipation, dizziness, abdominal pain, skin rash, fevers, chills, night sweats, weight loss, swollen lymph nodes, body aches, joint swelling, chest pain, shortness of breath, mood changes. POSITIVE muscle aches  Objective  Blood pressure (!) 150/90, pulse 91, height 5' 9"  (1.753 m), weight 173 lb (78.5 kg), SpO2 96 %.   General: No apparent distress alert and oriented x3 mood and affect normal, dressed appropriately.  HEENT: Pupils equal, extraocular movements intact  Respiratory: Patient's speak  in full sentences and does not appear short of breath  Cardiovascular: No lower extremity edema, non tender, no erythema  Patient is still in tenderness over the tibial tuberosity.  Patient does have good range of motion.  No significant swelling of the knee noted today.  Tender to palpation no still of the medial joint space.    Impression and Recommendations:     The above documentation has been reviewed and is accurate and complete Lyndal Pulley, DO

## 2020-09-23 ENCOUNTER — Encounter: Payer: Self-pay | Admitting: Family Medicine

## 2020-09-23 ENCOUNTER — Ambulatory Visit: Payer: 59 | Admitting: Family Medicine

## 2020-09-23 ENCOUNTER — Other Ambulatory Visit: Payer: Self-pay

## 2020-09-23 VITALS — BP 150/90 | HR 91 | Ht 69.0 in | Wt 173.0 lb

## 2020-09-23 DIAGNOSIS — M8700 Idiopathic aseptic necrosis of unspecified bone: Secondary | ICD-10-CM | POA: Diagnosis not present

## 2020-09-23 DIAGNOSIS — M25562 Pain in left knee: Secondary | ICD-10-CM | POA: Diagnosis not present

## 2020-09-23 DIAGNOSIS — G8929 Other chronic pain: Secondary | ICD-10-CM

## 2020-09-23 NOTE — Patient Instructions (Addendum)
Good to see you Referral sent I am here if you need me for any questions

## 2020-09-23 NOTE — Assessment & Plan Note (Signed)
Patient does have avascular necrosis of the tibia proximally.  Patient did have a fall in August 31 while she was at work.  Patient does have an x-ray from that time that did not show any significant bony abnormality.  With this MRI showing the avascular necrosis it is reasonable to consider that this was secondary to a traumatic injury.  Timing of this could correlate well.  Patient has done everything including the home exercises and establish a restriction at work as well with very minimal benefit.  I do believe that patient needs to be seen by orthopedic surgeons urgently and to make sure that this can be treated appropriately.  All the patient's questions were answered, went over patient's imaging today.  Total time reviewing patient's chart as well as imaging greater than 31 minutes.  Patient knows if she has any questions she can call us otherwise.  Continue same restrictions of 20 hours a week at work for now.

## 2020-09-24 ENCOUNTER — Other Ambulatory Visit: Payer: Self-pay | Admitting: Internal Medicine

## 2020-09-24 ENCOUNTER — Other Ambulatory Visit: Payer: Self-pay | Admitting: Family Medicine

## 2020-09-24 MED FILL — LOSARTAN POTASSIUM 100 MG T: 100 | 90 days supply | Qty: 90 | Fill #0

## 2020-09-24 MED FILL — PANTOPRAZOLE SOD DR 40 MG T: 40 | 90 days supply | Qty: 90 | Fill #3

## 2020-09-24 MED FILL — LIVALO 1 MG TABLET: 1 | 90 days supply | Qty: 90 | Fill #3

## 2020-09-24 MED FILL — HYDROCHLOROTHIAZIDE 25 MG T: 25 | 90 days supply | Qty: 90 | Fill #0

## 2020-09-30 ENCOUNTER — Other Ambulatory Visit (HOSPITAL_COMMUNITY): Payer: Self-pay | Admitting: Dermatology

## 2020-09-30 DIAGNOSIS — C44311 Basal cell carcinoma of skin of nose: Secondary | ICD-10-CM | POA: Diagnosis not present

## 2020-09-30 DIAGNOSIS — Z85828 Personal history of other malignant neoplasm of skin: Secondary | ICD-10-CM | POA: Diagnosis not present

## 2020-09-30 MED FILL — MUPIROCIN 2% OINTMENT: 2 | 30 days supply | Qty: 22 | Fill #0

## 2020-10-05 DIAGNOSIS — M25562 Pain in left knee: Secondary | ICD-10-CM | POA: Diagnosis not present

## 2020-10-05 MED FILL — PRALUENT 150 MG/ML SOAJ: 150 | 28 days supply | Qty: 2 | Fill #10

## 2020-10-21 ENCOUNTER — Other Ambulatory Visit: Payer: Self-pay

## 2020-10-21 ENCOUNTER — Ambulatory Visit
Admission: RE | Admit: 2020-10-21 | Discharge: 2020-10-21 | Disposition: A | Discharge status: Home / Self Care | Payer: 59 | Source: Ambulatory Visit | Attending: Family Medicine | Admitting: Family Medicine

## 2020-10-21 DIAGNOSIS — Z1231 Encounter for screening mammogram for malignant neoplasm of breast: Secondary | ICD-10-CM | POA: Diagnosis not present

## 2020-10-21 LAB — HM MAMMOGRAPHY

## 2020-10-26 DIAGNOSIS — R159 Full incontinence of feces: Secondary | ICD-10-CM | POA: Diagnosis not present

## 2020-10-28 IMAGING — DX DG KNEE COMPLETE 4+V*L*
4 series · 4 of 4 positions shown · non-contrast
Comparison: None.

CLINICAL DATA: Chronic knee pain

EXAM:
LEFT KNEE - COMPLETE 4+ VIEW

[knee ap]
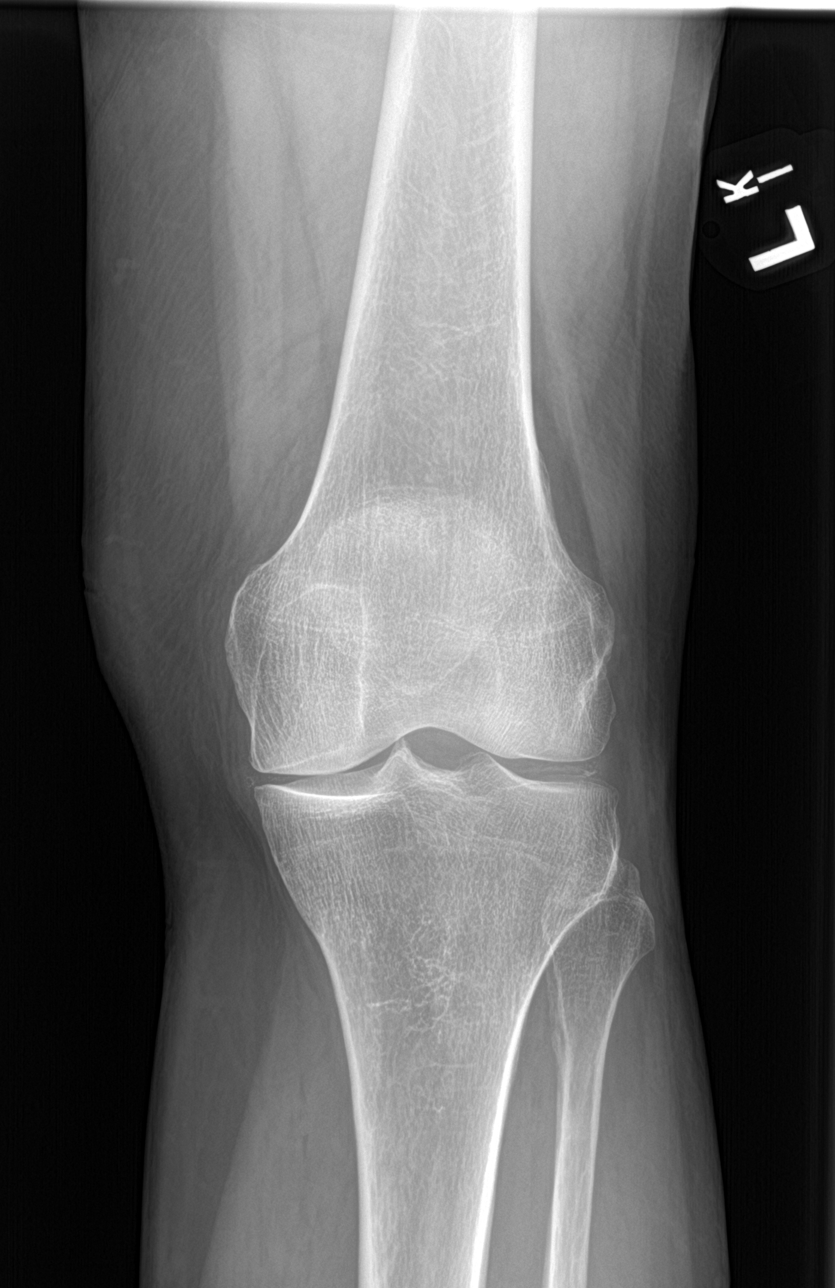

[knee tunnel]
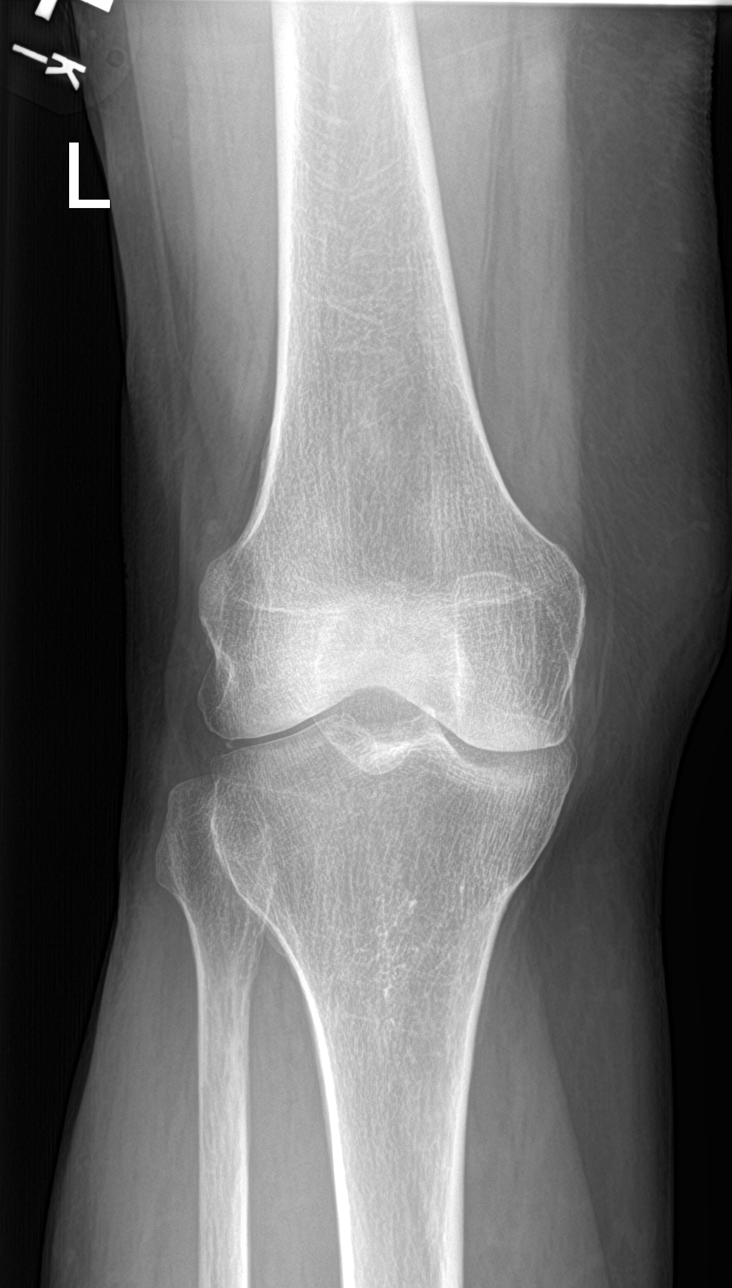

[knee lat]
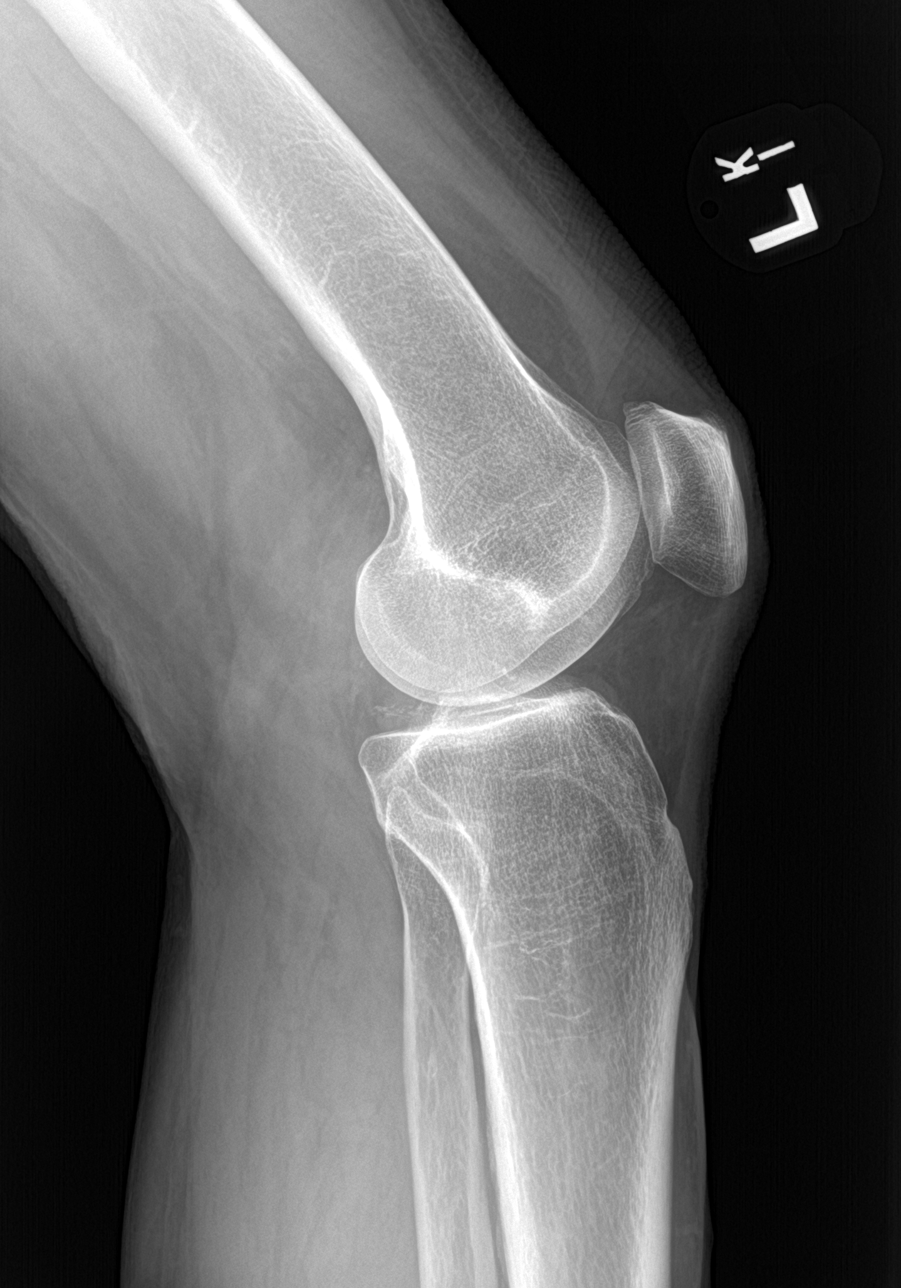

[sunrise]
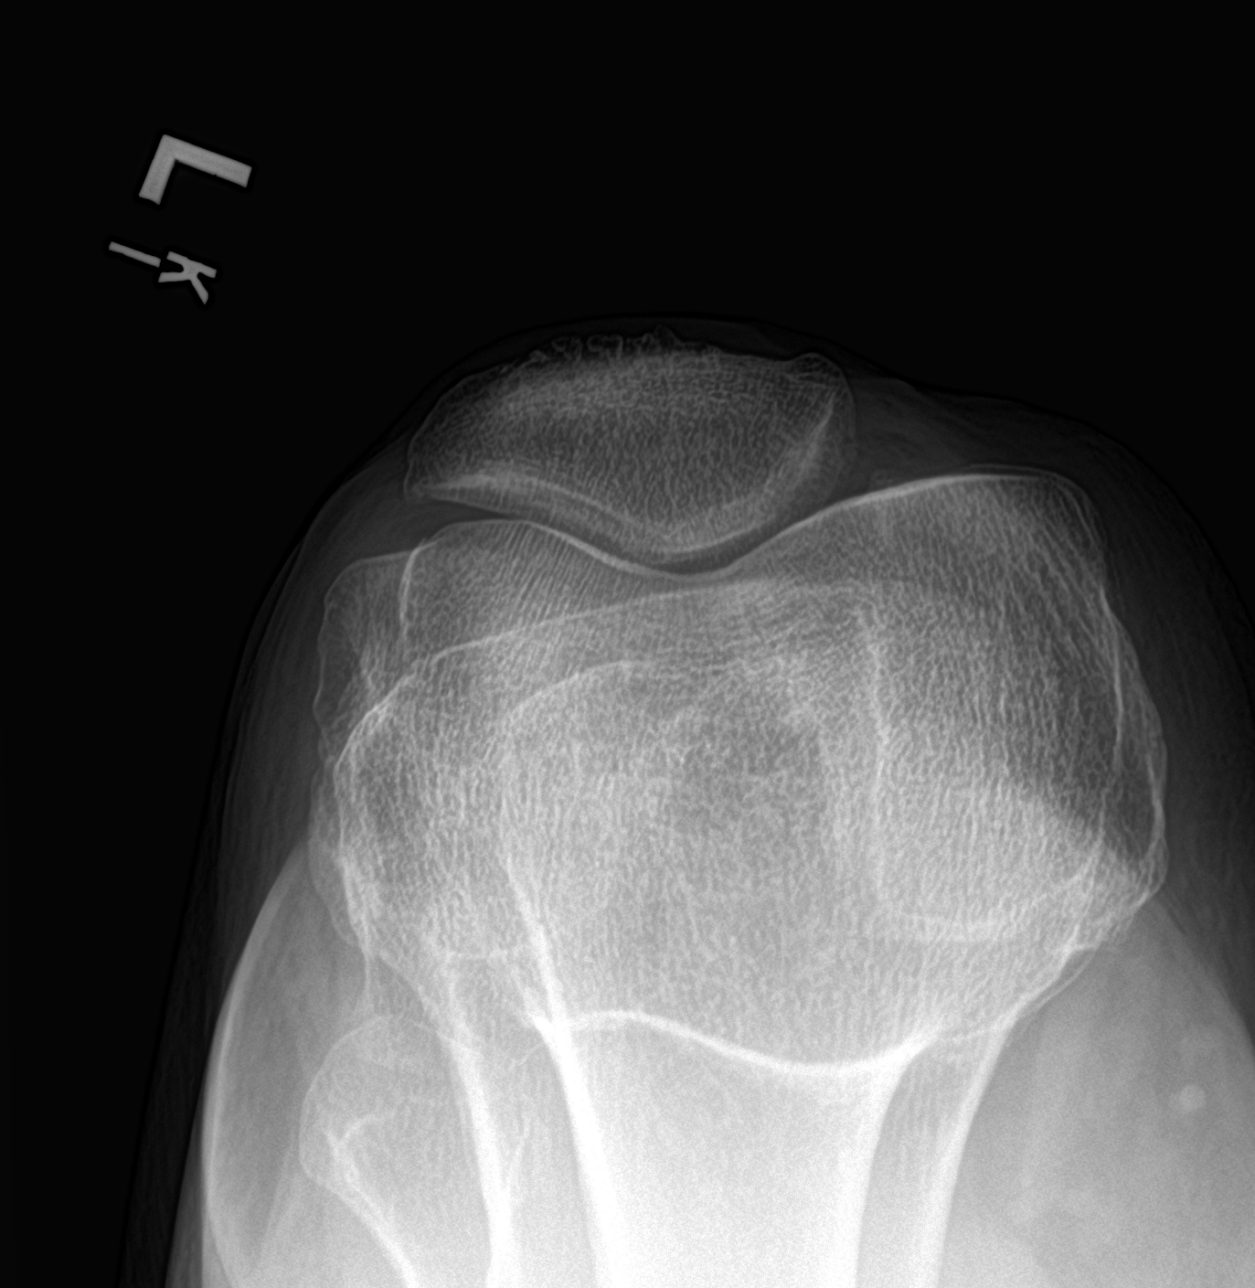

[4 of 4 positions shown; findings below may reference images not displayed]

FINDINGS: No fracture or dislocation of the left knee. Mild medial compartment
narrowing without significant osteophytosis. The lateral and
patellofemoral compartments are intact. Small, nonspecific knee
joint effusion. Soft tissues are unremarkable.
IMPRESSION: No fracture or dislocation of the left knee. Mild medial compartment
narrowing without significant osteophytosis. The lateral and
patellofemoral compartments are intact. Small, nonspecific knee
joint effusion.

## 2020-10-30 ENCOUNTER — Telehealth: Payer: Self-pay | Admitting: Family Medicine

## 2020-10-30 NOTE — Telephone Encounter (Signed)
[name] [name] from [employer] called stating that they are needing an updated note with a specific return to work date. She said that the patient gave her the date of 11/15/2020 but they are needing it from Dr Tamala Julian. I told them that she was referred to the surgeon and they would probably need to take over the leave of absence but they said because Dr Tamala Julian is the one that took her out, he would have to be the one to finish out the leave. She also said that she has tried to contact the patient to follow up on if she was seen by ortho and what they plan is with them but she has not been able to get in touch with her.  Letter can be faxed to [phone]

## 2020-11-02 MED FILL — PRALUENT 150 MG/ML SOAJ: 150 | 28 days supply | Qty: 2 | Fill #11

## 2020-11-02 NOTE — Telephone Encounter (Signed)
Spoke with patient, she talked with [employer] after this message was taken. We do not need to do anything further for [employer] as [facility] is taking over the leave ppwk.  Pt does need another letter like the one we did on 09/25/19. This is for her employer and she has to renew it annually. Should state "work no more than 2 back to back shifts per week. Does NOT need to state anything about total hours worked. This can be sent via [place].

## 2020-11-02 NOTE — Telephone Encounter (Signed)
Left patient message to call back to discuss plan of care with other provider.

## 2020-11-03 ENCOUNTER — Telehealth: Payer: Self-pay | Admitting: Internal Medicine

## 2020-11-03 DIAGNOSIS — E782 Mixed hyperlipidemia: Secondary | ICD-10-CM

## 2020-11-03 DIAGNOSIS — Z789 Other specified health status: Secondary | ICD-10-CM

## 2020-11-03 NOTE — Telephone Encounter (Signed)
Letter written and patient notified.

## 2020-11-03 NOTE — Telephone Encounter (Signed)
Fasting lipid panel due before 12/2020 visit -- appointment to be scheduled. Lab slip mailed

## 2020-11-10 ENCOUNTER — Telehealth: Payer: Self-pay | Admitting: Family Medicine

## 2020-11-10 DIAGNOSIS — K649 Unspecified hemorrhoids: Secondary | ICD-10-CM | POA: Diagnosis not present

## 2020-11-10 NOTE — Telephone Encounter (Signed)
Pt is questioning the letter we wrote about her NOT working more than 2 shifts. She has been doing this for a year ( see original letter we wrote on 09/25/19 ). This letter provides her an annual accomodation that is for her chronic hip/back pain, does not have anything to do with the knee she is seeing [facility] for.  Would like letter written just as it was 09/25/2019 so she can have this annual accomodation.

## 2020-11-11 NOTE — Telephone Encounter (Signed)
Letter done 11/11/2020.

## 2020-11-16 DIAGNOSIS — M25562 Pain in left knee: Secondary | ICD-10-CM | POA: Diagnosis not present

## 2020-11-17 NOTE — Telephone Encounter (Signed)
Per a verbal from Dr. Tamala Julian, he is not willing to write patient for permanent accomodation as she will need to be re-evaluated each year. Patient notified via [place].

## 2020-11-17 NOTE — Telephone Encounter (Signed)
Patient called back in response to the letter written for work.  She said that they are now needing it to also say that this is a permanent accomodation.  She asked if the letter could be faxed to [facility] / [employer] (Attn: Mingo Amber) at [phone].

## 2020-11-30 ENCOUNTER — Other Ambulatory Visit: Payer: Self-pay | Admitting: Family Medicine

## 2020-11-30 ENCOUNTER — Other Ambulatory Visit: Payer: Self-pay | Admitting: Internal Medicine

## 2020-11-30 MED FILL — PRALUENT 150 MG/ML SOAJ: 150 | 28 days supply | Qty: 2 | Fill #0

## 2020-11-30 MED FILL — ATENOLOL 50 MG TABLET: 50 | 90 days supply | Qty: 90 | Fill #0

## 2020-12-08 ENCOUNTER — Encounter: Payer: Self-pay | Admitting: Family Medicine

## 2020-12-08 ENCOUNTER — Ambulatory Visit: Payer: 59 | Admitting: Family Medicine

## 2020-12-08 ENCOUNTER — Ambulatory Visit (INDEPENDENT_AMBULATORY_CARE_PROVIDER_SITE_OTHER): Payer: 59

## 2020-12-08 ENCOUNTER — Other Ambulatory Visit: Payer: Self-pay

## 2020-12-08 VITALS — BP 146/82 | HR 87 | Ht 69.0 in | Wt 175.0 lb

## 2020-12-08 DIAGNOSIS — M25552 Pain in left hip: Secondary | ICD-10-CM

## 2020-12-08 DIAGNOSIS — M8700 Idiopathic aseptic necrosis of unspecified bone: Secondary | ICD-10-CM | POA: Diagnosis not present

## 2020-12-08 DIAGNOSIS — M4807 Spinal stenosis, lumbosacral region: Secondary | ICD-10-CM

## 2020-12-08 NOTE — Assessment & Plan Note (Addendum)
Patient has had spinal stenosis.  We usually causes more of the left hip pain.  We will get another x-ray of the hip that has been unremarkable previously.  Every time patient has had an epidural in the back at this area it does resolve the majority if not completely the pain in her hip.  We will get this done before she has any type of surgery of the knee.  Patient given the number to call and hopefully we will get this scheduled in the near future.  Usually I have patient see me again but patient will follow up with me more as needed with her seeing other providers.  Continue same restrictions at work.

## 2020-12-08 NOTE — Progress Notes (Signed)
[place] Phone: [phone] Subjective:   [name], am serving as a scribe for Dr. Hulan Saas. This visit occurred during the SARS-CoV-2 public health emergency.  Safety protocols were in place, including screening questions prior to the visit, additional usage of staff PPE, and extensive cleaning of exam room while observing appropriate contact time as indicated for disinfecting solutions.   I'm seeing this patient by the request  of:  Orma Flaming, MD  CC: Knee pain, back pain  CBS:[id number]   11/10/52021 Patient does have avascular necrosis of the tibia proximally.  Patient did have a fall in August 31 while she was at work.  Patient does have an x-ray from that time that did not show any significant bony abnormality.  With this MRI showing the avascular necrosis it is reasonable to consider that this was secondary to a traumatic injury.  Timing of this could correlate well.  Patient has done everything including the home exercises and establish a restriction at work as well with very minimal benefit.  I do believe that patient needs to be seen by orthopedic surgeons urgently and to make sure that this can be treated appropriately.  All the patient's questions were answered, went over patient's imaging today.  Total time reviewing patient's chart as well as imaging greater than 31 minutes.  Patient knows if she has any questions she can call us otherwise.  Continue same restrictions of 20 hours a week at work for now.  Update 12/08/2020 [name] [name] is a 60 y.o. female coming in with complaint of left hip pain. Last epidural 07/01/2020. Patient states that she has been putting off injection. Pain in lateral aspect and front of left hip and around into the glute. Flew to [place] and walking in airport made her pain worsen. Pain also worse in the mornings.  Has had relief from epidurals in the past.   Seeing Dr.  Rhona Raider again on 12/16/2020. Has tried injections and is going to try scope of knee once workers comp approves surgery.       Past Medical History:  Diagnosis Date  . Cerebral aneurysm 1999   Surgically coiled  . DDD (degenerative disc disease)   . GERD (gastroesophageal reflux disease)   . History of Barrett's esophagus   . Hypercholesterolemia   . Hypertension   . Irritable bowel disease   . Sinus disease    Past Surgical History:  Procedure Laterality Date  . ABDOMINAL HYSTERECTOMY  2005  . ANAL RECTAL MANOMETRY N/A 09/14/2020   Procedure: ANO RECTAL MANOMETRY;  Surgeon: Ileana Roup, MD;  Location: [facility];  Service: General;  Laterality: N/A;  . APPENDECTOMY    . ARTHROTOMY Right 12/11/2015   Procedure: OPEN ARTHROTOMY ;  Surgeon: Roseanne Kaufman, MD;  Location: [facility];  Service: Orthopedics;  Laterality: Right;  . AUGMENTATION MAMMAPLASTY Bilateral   . BREAST ENHANCEMENT SURGERY    . CEREBRAL ANEURYSM REPAIR  1999  . DIAGNOSTIC LAPAROSCOPY    . LUMBAR DISC SURGERY Left 06/03/2010   Procedure: ANTEROLATERAL DECOMPRESSION AND ARTHRODESIS L3-4, L4-5  . NASAL SINUS SURGERY  2013  . ORIF ANKLE FRACTURE  03/01/2012   Procedure: OPEN REDUCTION INTERNAL FIXATION (ORIF) ANKLE FRACTURE;  Surgeon: Wylene Simmer, MD;  Location: [facility];  Service: Orthopedics;  Laterality: Right;  Open reduction internal fixation right navicular fracture  . REFRACTIVE SURGERY    . SHOULDER ARTHROSCOPY Left   .  SYNOVECTOMY Right 12/11/2015   Procedure: SYNOVECTOMY DISTAL RADIOULNAR JOINT LOOSE BODY REMOVAL ;  Surgeon: Roseanne Kaufman, MD;  Location: [facility];  Service: Orthopedics;  Laterality: Right;  . WRIST ARTHROSCOPY Right 12/11/2015   Procedure: RIGHT WRIST ARTHROSCOPY WITH DEBRIDEMENT ;  Surgeon: Roseanne Kaufman, MD;  Location: [facility];  Service: Orthopedics;  Laterality: Right;   Social History   Socioeconomic History   . Marital status: Divorced    Spouse name: Not on file  . Number of children: Not on file  . Years of education: Not on file  . Highest education level: Not on file  Occupational History    Employer: [employer]  Tobacco Use  . Smoking status: Former Smoker    Quit date: 11/14/1984    Years since quitting: 36.0  . Smokeless tobacco: Never Used  Substance and Sexual Activity  . Alcohol use: Yes    Comment: social  . Drug use: No  . Sexual activity: Not on file  Other Topics Concern  . Not on file  Social History Narrative   The patient works as a Marine scientist in the electrophysiology department. Her daughter and 56-monthold granddaughter live with her. She does not smoke cigarettes.   Social Determinants of Health   Financial Resource Strain: Not on file  Food Insecurity: Not on file  Transportation Needs: Not on file  Physical Activity: Not on file  Stress: Not on file  Social Connections: Not on file   Allergies  Allergen Reactions  . Hydrocodone-Acetaminophen Itching  . Lipitor [Atorvastatin Calcium] Other (See Comments)    Elevated liver enzymes  . Morphine And Related Nausea Only  . Tetracyclines & Related Rash    Break out on all extremties   Family History  Problem Relation Age of Onset  . Hypertension Father   . Hypertension Mother   . Osteopenia Mother   . Osteopenia Maternal Grandmother      Current Outpatient Medications (Cardiovascular):  .  atenolol (TENORMIN) 50 MG tablet, TAKE 1 TABLET (50 MG TOTAL) BY MOUTH AT BEDTIME. .  hydrochlorothiazide (HYDRODIURIL) 25 MG tablet, TAKE 1 TABLET (25 MG TOTAL) BY MOUTH DAILY. .Marland Kitchen losartan (COZAAR) 100 MG tablet, TAKE 1 TABLET BY MOUTH DAILY. .Marland Kitchen Pitavastatin Calcium (LIVALO) 1 MG TABS, Take 1 tablet (1 mg total) by mouth daily. .Marland Kitchen PRALUENT 150 MG/ML SOAJ, INJECT 150 MG AS DIRECTED EVERY 14 (FOURTEEN) DAYS.   Current Outpatient Medications (Analgesics):  .  colchicine 0.6 MG tablet, Take 1 tablet (0.6 mg total) by  mouth daily.   Current Outpatient Medications (Other):  .Marland Kitchen Calcium Citrate-Vitamin D 315-250 MG-UNIT TABS, Take 1 tablet by mouth 2 (two) times daily. .  MULTIPLE VITAMINS-MINERALS PO, Take by mouth. .  Omega-3 Fatty Acids (FISH OIL) 1000 MG CAPS, Take 1,400 mg by mouth.  .  pantoprazole (PROTONIX) 40 MG tablet, Take 1 tablet (40 mg total) by mouth daily. .  valACYclovir (VALTREX) 500 MG tablet, Take 500 mg by mouth 2 (two) times daily.   Reviewed prior external information including notes and imaging from  primary care provider As well as notes that were available from care everywhere and other healthcare systems.  Past medical history, social, surgical and family history all reviewed in electronic medical record.  No pertanent information unless stated regarding to the chief complaint.   Review of Systems:  No headache, visual changes, nausea, vomiting, diarrhea, constipation, dizziness, abdominal pain, skin rash, fevers, chills, night sweats, weight loss,  swollen lymph nodes, body aches, joint swelling, chest pain, shortness of breath, mood changes. POSITIVE muscle aches  Objective  Blood pressure (!) 146/82, pulse 87, height 5' 9"  (1.753 m), weight 175 lb (79.4 kg), SpO2 98 %.   General: No apparent distress alert and oriented x3 mood and affect normal, dressed appropriately.  HEENT: Pupils equal, extraocular movements intact  Respiratory: Patient's speak in full sentences and does not appear short of breath  Cardiovascular: No lower extremity edema, non tender, no erythema  Gait normal with good balance and coordination.  MSK: Deferred knee exam.  The patient was very minorly tender over the medial aspect. Left hip has some mild decrease in internal range of motion.  Patient is guarding secondary to more of the knee pain.  Mild increase in discomfort of the lateral hip with forward flexion of 45 degrees of the left leg.  Neurovascularly intact distally. Low back exam mild  tenderness noted in the paraspinal musculature of the lumbar spine.  Sitting does seem to be somewhat uncomfortable to patient..     Impression and Recommendations:     The above documentation has been reviewed and is accurate and complete [name] Pulley, DO

## 2020-12-08 NOTE — Patient Instructions (Addendum)
Good to see you Epidural L4-5 I will send note to [facility] See me again as needed

## 2020-12-08 NOTE — Assessment & Plan Note (Signed)
Avascular necrosis of the tibia.  Patient did have a fall at work that likely contributed to this.  Patient is still trying to get approval and will likely have arthroscopic procedure.  He may need even a potential replacement in the long run.  Following up with orthopedic specialist in another week.

## 2020-12-09 ENCOUNTER — Other Ambulatory Visit (HOSPITAL_COMMUNITY): Payer: Self-pay | Admitting: Nurse Practitioner

## 2020-12-09 DIAGNOSIS — B009 Herpesviral infection, unspecified: Secondary | ICD-10-CM | POA: Diagnosis not present

## 2020-12-09 MED FILL — valACYclovir HCL 1 GM TABS: 1 | 90 days supply | Qty: 90 | Fill #0

## 2020-12-14 ENCOUNTER — Ambulatory Visit
Admission: RE | Admit: 2020-12-14 | Discharge: 2020-12-14 | Disposition: A | Discharge status: Home / Self Care | Payer: 59 | Source: Ambulatory Visit | Attending: Family Medicine | Admitting: Family Medicine

## 2020-12-14 ENCOUNTER — Other Ambulatory Visit: Payer: Self-pay

## 2020-12-14 DIAGNOSIS — M47817 Spondylosis without myelopathy or radiculopathy, lumbosacral region: Secondary | ICD-10-CM | POA: Diagnosis not present

## 2020-12-14 DIAGNOSIS — M25552 Pain in left hip: Secondary | ICD-10-CM

## 2020-12-14 MED ORDER — DIAZEPAM 5 MG PO TABS
5.0000 mg | ORAL_TABLET | Freq: Once | ORAL | Status: AC
  Administered 2020-12-14: 5 mg via ORAL

## 2020-12-14 NOTE — Discharge Instructions (Signed)

## 2020-12-16 DIAGNOSIS — M25562 Pain in left knee: Secondary | ICD-10-CM | POA: Diagnosis not present

## 2020-12-17 ENCOUNTER — Other Ambulatory Visit: Payer: Self-pay | Admitting: Internal Medicine

## 2020-12-17 ENCOUNTER — Other Ambulatory Visit: Payer: Self-pay | Admitting: Family Medicine

## 2020-12-17 MED FILL — HYDROCHLOROTHIAZIDE 25 MG T: 25 | 90 days supply | Qty: 90 | Fill #1

## 2020-12-17 MED FILL — PANTOPRAZOLE SOD DR 40 MG T: 40 | 90 days supply | Qty: 90 | Fill #0

## 2020-12-17 MED FILL — LIVALO 1 MG TABLET: 1 | 90 days supply | Qty: 90 | Fill #0

## 2020-12-17 MED FILL — LOSARTAN POTASSIUM 100 MG T: 100 | 30 days supply | Qty: 30 | Fill #1

## 2020-12-28 MED FILL — PRALUENT 150 MG/ML SOAJ: 150 | 28 days supply | Qty: 2 | Fill #1

## 2021-01-13 DIAGNOSIS — E782 Mixed hyperlipidemia: Secondary | ICD-10-CM | POA: Diagnosis not present

## 2021-01-13 DIAGNOSIS — Z789 Other specified health status: Secondary | ICD-10-CM | POA: Diagnosis not present

## 2021-01-14 LAB — LIPID PANEL
Chol/HDL Ratio: 2 ratio (ref 0.0–4.4)
Cholesterol, Total: 139 mg/dL (ref 100–199)
HDL: 71 mg/dL (ref >39)
LDL Chol Calc (NIH): 52 mg/dL (ref 0–99)
Triglycerides: 86 mg/dL (ref 0–149)
VLDL Cholesterol Cal: 16 mg/dL (ref 5–40)

## 2021-01-18 DIAGNOSIS — M25562 Pain in left knee: Secondary | ICD-10-CM | POA: Diagnosis not present

## 2021-01-20 ENCOUNTER — Other Ambulatory Visit: Payer: Self-pay

## 2021-01-20 ENCOUNTER — Ambulatory Visit: Payer: 59 | Admitting: Internal Medicine

## 2021-01-20 ENCOUNTER — Encounter: Payer: Self-pay | Admitting: Physical Therapy

## 2021-01-20 ENCOUNTER — Encounter: Payer: Self-pay | Admitting: Internal Medicine

## 2021-01-20 ENCOUNTER — Ambulatory Visit: Payer: 59 | Attending: Surgery | Admitting: Physical Therapy

## 2021-01-20 VITALS — BP 114/80 | HR 80 | Ht 69.0 in | Wt 171.4 lb

## 2021-01-20 DIAGNOSIS — K760 Fatty (change of) liver, not elsewhere classified: Secondary | ICD-10-CM

## 2021-01-20 DIAGNOSIS — R278 Other lack of coordination: Secondary | ICD-10-CM | POA: Diagnosis not present

## 2021-01-20 DIAGNOSIS — R931 Abnormal findings on diagnostic imaging of heart and coronary circulation: Secondary | ICD-10-CM | POA: Diagnosis not present

## 2021-01-20 DIAGNOSIS — M791 Myalgia, unspecified site: Secondary | ICD-10-CM

## 2021-01-20 DIAGNOSIS — M6281 Muscle weakness (generalized): Secondary | ICD-10-CM | POA: Insufficient documentation

## 2021-01-20 DIAGNOSIS — E782 Mixed hyperlipidemia: Secondary | ICD-10-CM | POA: Diagnosis not present

## 2021-01-20 DIAGNOSIS — R252 Cramp and spasm: Secondary | ICD-10-CM | POA: Insufficient documentation

## 2021-01-20 DIAGNOSIS — T466X5A Adverse effect of antihyperlipidemic and antiarteriosclerotic drugs, initial encounter: Secondary | ICD-10-CM

## 2021-01-20 NOTE — Progress Notes (Signed)
OFFICE NOTE  Chief Complaint:  Follow-up dyslipidemia  Primary Care Physician: Orma Flaming, MD  HPI:  [name] [name] is a 60 y.o. female with a past medial history significant for cerebral aneurysm, hypertension, dyslipidemia and family history of hypertension and dyslipidemia, but no significant early onset heart disease.  She was noted to have dyslipidemia in the past and this was monitored.  When she changed primary care providers she was started on statin therapy which caused a marked reduction in cholesterol.  The time this was atorvastatin 10 mg, however she had significant elevation of liver enzymes.  An abdominal ultrasound in 2018 demonstrated fatty liver disease is the likely cause of this.  After stopping the statin therapy her liver enzymes returned to baseline which was borderline abnormal.  She was seen by Tana Coast, PharmD, for evaluation of her dyslipidemia as a new patient. Georgina Peer recommended trying Livalo 1 mg daily.  [name] [name] reports compliance with this medication and had liver enzyme testing 2 months ago which indicated stable liver enzymes.  I suspect she is tolerating this well as the statin is predominantly glucuronidated.  However, it is a low to moderate intensity statin at various doses and may not reduce her cholesterol significantly.  We further discussed her diet today which she felt was very healthy.  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  She reports a very deep knowledge of healthy diet.  There is no documented ASCVD.  05/01/2018  [name] [name] returns today for follow-up.  She has aggressively tried to alter her diet as well as maintain compliance with Livalo 1 mg daily.  We repeated recent lab work which indicates reduction in cholesterol the 271, triglycerides were 154 (reduced from 290), HDL increased from 45-73 and calculated LDL  was 167 (up from 146).  This does represent an increase LDL, however she has had particle shifting.  Liver enzymes have remained stable he mild elevated less than 2 times the upper limit of normal.  She underwent coronary artery calcium scoring, which demonstrated a calcium score of 6.  There was some scattered sub-pulmonary nodules and I would recommend a repeat CT scan without contrast in 1 year.  07/31/2018  [name] [name] is seen today in follow-up.  Overall she seems to be doing well.  I increased her Livalo to 2 mg daily.  This was associated with an increase in liver enzymes which was significant.  Her AST went from 21-67 and ALT from 26-138.  Again, she remains asymptomatic with this, however I have some concern given her history of underlying liver disease whether she will be tolerating this increase.  It is seems that she will not.   12/19/2018  [name] [name] returns today for follow-up.  Overall she has been doing well and tolerating Praluent.  We had to reduce the dose of her Livalo due to elevated liver enzymes.  Her most recent liver enzymes however have normalized with AST of 33 and ALT of 23.  Her cholesterol has also dropped significantly on Praluent.  Her total cholesterol is 132, triglycerides 173 (decreased from 220), HDL 57 and LDL 40 (decreased from 167).  She is tolerating the medication without any myalgias or other side effects.  12/24/2018  [name] [name] is seen today for annual follow-up.  She continues to do fairly well.  She struggles with low back pain and  some issues related to that.  She has been less physically active due to that.  Over the summer her cholesterol had gone up.  Her total was 199, triglycerides 117, HDL 55 and LDL 91 (increased from 40).  She reports compliance with her Praluent and Livalo.  She is due for refills.  She has had repeat prior authorization for the medication.   01/20/2021  [name] [name] returns today for follow-up.  She continues to do well on Praluent in  addition to low-dose Livalo.  Her most recent lipid profile showed total cholesterol 139, HDL 71, triglycerides 86 and LDL 52.  She did have mild elevation ALT to 40 and in October 2021.  In the past she has had more elevated liver enzymes however for the past 2 years they have been generally normal.  She does have underlying hepatic steatosis.  PMHx:  Past Medical History:  Diagnosis Date  . Cerebral aneurysm 1999   Surgically coiled  . DDD (degenerative disc disease)   . GERD (gastroesophageal reflux disease)   . History of Barrett's esophagus   . Hypercholesterolemia   . Hypertension   . Irritable bowel disease   . Sinus disease     Past Surgical History:  Procedure Laterality Date  . ABDOMINAL HYSTERECTOMY  2005  . ANAL RECTAL MANOMETRY N/A 09/14/2020   Procedure: ANO RECTAL MANOMETRY;  Surgeon: Ileana Roup, MD;  Location: [facility];  Service: General;  Laterality: N/A;  . APPENDECTOMY    . ARTHROTOMY Right 12/11/2015   Procedure: OPEN ARTHROTOMY ;  Surgeon: Roseanne Kaufman, MD;  Location: [place];  Service: Orthopedics;  Laterality: Right;  . AUGMENTATION MAMMAPLASTY Bilateral   . BREAST ENHANCEMENT SURGERY    . CEREBRAL ANEURYSM REPAIR  1999  . DIAGNOSTIC LAPAROSCOPY    . LUMBAR DISC SURGERY Left 06/03/2010   Procedure: ANTEROLATERAL DECOMPRESSION AND ARTHRODESIS L3-4, L4-5  . NASAL SINUS SURGERY  2013  . ORIF ANKLE FRACTURE  03/01/2012   Procedure: OPEN REDUCTION INTERNAL FIXATION (ORIF) ANKLE FRACTURE;  Surgeon: Wylene Simmer, MD;  Location: [place];  Service: Orthopedics;  Laterality: Right;  Open reduction internal fixation right navicular fracture  . REFRACTIVE SURGERY    . SHOULDER ARTHROSCOPY Left   . SYNOVECTOMY Right 12/11/2015   Procedure: SYNOVECTOMY DISTAL RADIOULNAR JOINT LOOSE BODY REMOVAL ;  Surgeon: Roseanne Kaufman, MD;  Location: [place];  Service: Orthopedics;  Laterality: Right;  . WRIST  ARTHROSCOPY Right 12/11/2015   Procedure: RIGHT WRIST ARTHROSCOPY WITH DEBRIDEMENT ;  Surgeon: Roseanne Kaufman, MD;  Location: [place];  Service: Orthopedics;  Laterality: Right;    FAMHx:  Family History  Problem Relation Age of Onset  . Hypertension Father   . Hypertension Mother   . Osteopenia Mother   . Osteopenia Maternal Grandmother     SOCHx:   reports that she quit smoking about 36 years ago. She has never used smokeless tobacco. She reports current alcohol use. She reports that she does not use drugs.  ALLERGIES:  Allergies  Allergen Reactions  . Hydrocodone-Acetaminophen Itching  . Lipitor [Atorvastatin Calcium] Other (See Comments)    Elevated liver enzymes  . Morphine And Related Nausea Only  . Tetracyclines & Related Rash    Break out on all extremties    ROS: Pertinent items noted in HPI and remainder of comprehensive ROS otherwise negative.  HOME MEDS: Current Outpatient Medications on File Prior to Visit  Medication Sig Dispense Refill  .  atenolol (TENORMIN) 50 MG tablet TAKE 1 TABLET (50 MG TOTAL) BY MOUTH AT BEDTIME. 90 tablet 0  . Calcium Citrate-Vitamin D 315-250 MG-UNIT TABS Take 1 tablet by mouth 2 (two) times daily.    . hydrochlorothiazide (HYDRODIURIL) 25 MG tablet TAKE 1 TABLET (25 MG TOTAL) BY MOUTH DAILY. 90 tablet 2  . LIVALO 1 MG TABS TAKE 1 TABLET (1 MG TOTAL) BY MOUTH DAILY. 90 tablet 0  . losartan (COZAAR) 100 MG tablet TAKE 1 TABLET BY MOUTH DAILY. 90 tablet 2  . MULTIPLE VITAMINS-MINERALS PO Take by mouth daily.    . Omega-3 Fatty Acids (FISH OIL) 1000 MG CAPS Take 1,400 mg by mouth.     . pantoprazole (PROTONIX) 40 MG tablet TAKE 1 TABLET (40 MG TOTAL) BY MOUTH DAILY. 90 tablet 3  . PRALUENT 150 MG/ML SOAJ INJECT 150 MG AS DIRECTED EVERY 14 (FOURTEEN) DAYS. 2 mL 11  . Turmeric 500 MG CAPS Take 500 mg by mouth 2 (two) times daily.    . valACYclovir (VALTREX) 500 MG tablet Take 500 mg by mouth daily.     No current  facility-administered medications on file prior to visit.    LABS/IMAGING: No results found for this or any previous visit (from the past 48 hour(s)). No results found.  LIPID PANEL:    Component Value Date/Time   CHOL 139 01/13/2021 1246   TRIG 86 01/13/2021 1246   HDL 71 01/13/2021 1246   CHOLHDL 2.0 01/13/2021 1246   CHOLHDL 2 05/27/2019 1118   VLDL 23.4 05/27/2019 1118   LDLCALC 52 01/13/2021 1246   LDLDIRECT 146.0 11/21/2017 0913     WEIGHTS: Wt Readings from Last 3 Encounters:  01/20/21 171 lb 6.4 oz (77.7 kg)  12/08/20 175 lb (79.4 kg)  09/23/20 173 lb (78.5 kg)    VITALS: BP 114/80 (BP Location: Left Arm, Patient Position: Sitting)   Pulse 80   Ht 5' 9"  (1.753 m)   Wt 171 lb 6.4 oz (77.7 kg)   SpO2 97%   BMI 25.31 kg/m   EXAM: Deferred  EKG: Deferred  ASSESSMENT: 1. Dyslipidemia -low 10-year risk of 5% 2. CAC of 6 (03/2018) 3. Family history of dyslipidemia and hypertension 4. Hypertension-controlled 5. NAFLD 6. Subcentimeter pulmonary nodules  PLAN: 1.   [name] [name] continues to have good control over her cholesterol. I would not recommend changing her current regimen. She had very mild elevation in AST/ALT over the Summer, but previously had normal liver enzymes dating back 2 years and prior to that the liver enzymes had been significantly elevated. I'm not concerned about the minimal liver enzyme elevation, but thought we could recheck it now if she wishes since it was not checked with her blood last week - unfortunately it cannot be added. She said she would be ok to check it next year or with her   Follow-up with me annually or sooner as necessary.  Pixie Casino, MD, [facility], [place] Director of the Advanced Lipid Disorders &  Cardiovascular Risk Reduction Clinic Diplomate of the American Board of Clinical Lipidology Attending Cardiologist  Direct Dial: [phone]  Fax: [phone]  Website:   [email or web address] 01/20/2021, 11:36 AM

## 2021-01-20 NOTE — Therapy (Addendum)
[facility] [address] [place], [place], [address] Phone: [phone]   Fax:  [phone]  Physical Therapy Evaluation  Patient Details  Name: [name] [name] MRN: [id number] Date of Birth: April 26, 1961 Referring Provider (PT): Dr. Nadeen Landau   Encounter Date: 01/20/2021   PT End of Session - 01/20/21 1306    Visit Number 1    Date for PT Re-Evaluation 04/14/21    Authorization Type [place] [employer]    PT Start Time 1230    PT Stop Time 1310    PT Time Calculation (min) 40 min    Activity Tolerance Patient tolerated treatment well;No increased pain    Behavior During Therapy WFL for tasks assessed/performed           Past Medical History:  Diagnosis Date  . Cerebral aneurysm 1999   Surgically coiled  . DDD (degenerative disc disease)   . GERD (gastroesophageal reflux disease)   . History of Barrett's esophagus   . Hypercholesterolemia   . Hypertension   . Irritable bowel disease   . Sinus disease     Past Surgical History:  Procedure Laterality Date  . ABDOMINAL HYSTERECTOMY  2005  . ANAL RECTAL MANOMETRY N/A 09/14/2020   Procedure: ANO RECTAL MANOMETRY;  Surgeon: Ileana Roup, MD;  [facility];  Service: General;  Laterality: N/A;  . APPENDECTOMY    . ARTHROTOMY Right 12/11/2015   Procedure: OPEN ARTHROTOMY ;  Surgeon: Roseanne Kaufman, MD;  Location: [place];  Service: Orthopedics;  Laterality: Right;  . AUGMENTATION MAMMAPLASTY Bilateral   . BREAST ENHANCEMENT SURGERY    . CEREBRAL ANEURYSM REPAIR  1999  . DIAGNOSTIC LAPAROSCOPY    . LUMBAR DISC SURGERY Left 06/03/2010   Procedure: ANTEROLATERAL DECOMPRESSION AND ARTHRODESIS L3-4, L4-5  . NASAL SINUS SURGERY  2013  . ORIF ANKLE FRACTURE  03/01/2012   Procedure: OPEN REDUCTION INTERNAL FIXATION (ORIF) ANKLE FRACTURE;  Surgeon: Wylene Simmer, MD;  Location: [place];  Service: Orthopedics;   Laterality: Right;  Open reduction internal fixation right navicular fracture  . REFRACTIVE SURGERY    . SHOULDER ARTHROSCOPY Left   . SYNOVECTOMY Right 12/11/2015   Procedure: SYNOVECTOMY DISTAL RADIOULNAR JOINT LOOSE BODY REMOVAL ;  Surgeon: Roseanne Kaufman, MD;  Location: [place];  Service: Orthopedics;  Laterality: Right;  . WRIST ARTHROSCOPY Right 12/11/2015   Procedure: RIGHT WRIST ARTHROSCOPY WITH DEBRIDEMENT ;  Surgeon: Roseanne Kaufman, MD;  Location: [place];  Service: Orthopedics;  Laterality: Right;    There were no vitals filed for this visit.    Subjective Assessment - 01/20/21 1235    Subjective Since the birth of her first child she had a grade 4 tear and had it repaired. Patient has had issues with a hemorroid. Alot of times the hemorroid will prolapse out. It bleeds and is painful. She is a high risk to remove it. No trouble to have a bowel movement. Patient does not have to push to have a bowel movement. Patient sees a Restaurant manager, fast food. Stool son soft side. Patient has stool daily. Hemmorroid will bleed and has to wear a liner all the time. Prolapses daily multiple times.    Patient Stated Goals to avoid surgery for the hemorroid    Currently in Pain? Yes    Pain Score 3    uncomfortable is 10/10   Pain Orientation Mid    Pain Descriptors / Indicators Burning    Pain  Type Chronic pain    Pain Onset More than a month ago    Pain Frequency Constant    Aggravating Factors  sitting when hemorroid is prolapsed    Pain Relieving Factors not sit on it    Multiple Pain Sites No              [facility] PT Assessment - 01/20/21 0001      Assessment   Medical Diagnosis Hemorroid K64.9    Referring Provider (PT) Dr. Nadeen Landau    Prior Therapy none      Precautions   Precautions None      Restrictions   Weight Bearing Restrictions No      Balance Screen   Has the patient fallen in the past 6 months Yes    How many times? 1   foot got  hooked on a table, not due to balance   Has the patient had a decrease in activity level because of a fear of falling?  No    Is the patient reluctant to leave their home because of a fear of falling?  No      Home Ecologist residence      Prior Function   Level of Independence Independent    Vocation Full time employment    Vocation Requirements standing on hard surface      Cognition   Overall Cognitive Status Within Functional Limits for tasks assessed      ROM / Strength   AROM / PROM / Strength AROM;PROM;Strength                      Objective measurements completed on examination: See above findings.     Pelvic Floor Special Questions - 01/20/21 0001    Prior Pregnancies Yes    Number of Vaginal Deliveries 2   had a 4th degree tear   Currently Sexually Active No    Urinary Leakage No    Fecal incontinence Yes   when has the urge   Falling out feeling (prolapse) No    Prolapse Posterior Wall    Pelvic Floor Internal Exam Patient confirms identification and approves PT to assess pelvic floor and treatment    Exam Type Vaginal    Strength weak squeeze, no lift                    PT Education - 01/20/21 1307    Education Details educated patient on how to massage the post. vaginal wall and gave sample of the [employer] REveleum for pain relief of hemorroid    Person(s) Educated Patient    Methods Explanation;Demonstration    Comprehension Returned demonstration;Verbalized understanding            PT Short Term Goals - 01/20/21 1356      PT SHORT TERM GOAL #1   Title understand ways to manage her Hemorroid with using cream and massage    Baseline ---    Time 4    Period Weeks    Status New    Target Date 02/17/21             PT Long Term Goals - 01/20/21 1357      PT LONG TERM GOAL #1   Title independent with HEP for pelvic floor strength and core strength    Baseline ---    Time 12     Period Weeks    Status New  Target Date 04/14/21      PT LONG TERM GOAL #2   Title pelvic floor strength is >/= 3/5 due to improved elongation and contraction of the pelvic floor    Baseline ---    Time 12    Period Weeks    Status New    Target Date 04/14/21      PT LONG TERM GOAL #3   Title posterior wall of the vaginal canal has increased tissue mobility due to improved mobility of the 4th degree tear and improved blood flow    Baseline ---    Time 12    Period Weeks    Status New    Target Date 04/14/21      PT LONG TERM GOAL #4   Title able to bulge the pelvic floor with breath and not forceful to elongate the pelvic floor and relax    Baseline ----    Time 12    Period Weeks    Status New    Target Date 04/14/21                  Plan - 01/20/21 1308    Clinical Impression Statement Patient is a 60 year old female with a Hemorroid that prolapses in and out of the anus causing discomfort. Patient reports her pain level is 3/10 but discomfort level is 10/10. She ahs to wear a liner all day due to not knowing when the Hemorroid will bleed. When the hemorroid prolapses she has pain with sititng. Vaginally she has weakness on the posterior wall. Her vaginal strength is 2/5. She has tightness in the posterior vaginal wall. Patient has a history of grade 4 tear with her first child. Patient will benefit from improve pelvic floor strength, improve tissue mobility and manage her hemorroid.    Personal Factors and Comorbidities Comorbidity 3+;Time since onset of injury/illness/exacerbation    Comorbidities grade 4 tear with her first child; Hemorroid that will prolapse; multiple orthorpedic surgeries; Abdominal Hysterectomy 2005    Examination-Activity Limitations Sit    Stability/Clinical Decision Making Stable/Uncomplicated    Clinical Decision Making Low    Rehab Potential Excellent    PT Frequency 1x / week    PT Duration 12 weeks    PT Treatment/Interventions  ADLs/Self Care Home Management;Biofeedback;Cryotherapy;Electrical Stimulation;Moist Heat;Ultrasound;Neuromuscular re-education;Therapeutic exercise;Therapeutic activities;Patient/family education;Manual techniques;Dry needling    PT Next Visit Plan manual therapy to the post. introitus, work o abdominal bracing with pelvic floor contraction; elongation of the pelvic floor with breath; see how the desert harvest reveleum helps    Consulted and Agree with Plan of Care Patient           Patient will benefit from skilled therapeutic intervention in order to improve the following deficits and impairments:  Decreased coordination,Increased fascial restricitons,Pain,Decreased endurance,Decreased activity tolerance,Decreased strength  Visit Diagnosis: Muscle weakness (generalized) - Plan: PT plan of care cert/re-cert  Cramp and spasm - Plan: PT plan of care cert/re-cert  Other lack of coordination - Plan: PT plan of care cert/re-cert     Problem List Patient Active Problem List   Diagnosis Date Noted  . Avascular necrosis ([place]) 09/23/2020  . Polyarthralgia 08/27/2020  . Chondromalacia of left patellofemoral joint 09/25/2019  . Spinal stenosis 04/12/2019  . Left hip pain 12/11/2018  . Statin intolerance 08/03/2018  . Pulmonary nodules 05/01/2018  . NAFLD (nonalcoholic fatty liver disease) 11/21/2017  . Barrett esophagus 09/01/2017  . Cerebral aneurysm 09/01/2017  . GERD (gastroesophageal reflux disease) 09/01/2017  .  Hyperlipidemia 09/01/2017  . IBS (irritable bowel syndrome) 09/01/2017  . Benign hypertension 01/10/2012    Earlie Counts, PT 01/20/21 2:02 PM   [facility] [address], [place], [place], [address] Phone: [phone]   Fax:  [phone]  Name: MARKEISHA MANCIAS MRN: [id number] Date of Birth: 1960-12-13 PHYSICAL THERAPY DISCHARGE SUMMARY  Visits from Start of Care: 1  Current functional level related to  goals / functional outcomes: Patient called today to cancel her appointments to be discharged due to having other health issues she needs to address.    Remaining deficits: See above.    Education / Equipment: HEP Plan: Patient agrees to discharge.  Patient goals were not met. Patient is being discharged due to the patient's request. Thank you for the referral. Earlie Counts, PT 03/09/21 5:23 PM   ?????

## 2021-01-20 NOTE — Patient Instructions (Addendum)
Medication Instructions:  Your physician recommends that you continue on your current medications as directed. Please refer to the Current Medication list given to you today.  *If you need a refill on your cardiac medications before your next appointment, please call your pharmacy*   Lab Work: FASTING lipid panel, hepatic function panel in 1 year, before your next appointment  If you have labs (blood work) drawn today and your tests are completely normal, you will receive your results only by: Marland Kitchen MyChart Message (if you have MyChart) OR . A paper copy in the mail If you have any lab test that is abnormal or we need to change your treatment, we will call you to review the results.   Testing/Procedures: NONE   Follow-Up: At [facility], you and your health needs are our priority.  As part of our continuing mission to provide you with exceptional heart care, we have created designated Provider Care Teams.  These Care Teams include your primary Cardiologist (physician) and Advanced Practice Providers (APPs -  Physician Assistants and Nurse Practitioners) who all work together to provide you with the care you need, when you need it.  We recommend signing up for the patient portal called "MyChart".  Sign up information is provided on this After Visit Summary.  MyChart is used to connect with patients for Virtual Visits (Telemedicine).  Patients are able to view lab/test results, encounter notes, upcoming appointments, etc.  Non-urgent messages can be sent to your provider as well.   To learn more about what you can do with MyChart, go to [email or web address].    Your next appointment:   12 month(s) - lipid clinic  The format for your next appointment:   In Person  Provider:   K. Mali Hilty, MD   Other Instructions

## 2021-02-11 ENCOUNTER — Other Ambulatory Visit: Payer: Self-pay | Admitting: Family Medicine

## 2021-02-11 MED FILL — LOSARTAN POTASSIUM 100 MG T: 100 | 30 days supply | Qty: 30 | Fill #2

## 2021-02-12 MED FILL — ATENOLOL 50 MG TABLET: 50 | 90 days supply | Qty: 90 | Fill #0

## 2021-02-17 DIAGNOSIS — M25562 Pain in left knee: Secondary | ICD-10-CM | POA: Diagnosis not present

## 2021-02-20 MED FILL — Alirocumab Subcutaneous Solution Auto-Injector 150 MG/ML: SUBCUTANEOUS | 28 days supply | Qty: 2 | Fill #0 | Status: AC

## 2021-02-22 ENCOUNTER — Other Ambulatory Visit (HOSPITAL_COMMUNITY): Payer: Self-pay

## 2021-02-23 ENCOUNTER — Other Ambulatory Visit (HOSPITAL_COMMUNITY): Payer: Self-pay

## 2021-02-25 ENCOUNTER — Ambulatory Visit: Payer: 59 | Admitting: Physical Therapy

## 2021-03-11 ENCOUNTER — Encounter: Payer: 59 | Admitting: Physical Therapy

## 2021-03-15 ENCOUNTER — Other Ambulatory Visit: Payer: Self-pay | Admitting: Internal Medicine

## 2021-03-15 MED FILL — Hydrochlorothiazide Tab 25 MG: ORAL | 90 days supply | Qty: 90 | Fill #0 | Status: AC

## 2021-03-15 MED FILL — Pantoprazole Sodium EC Tab 40 MG (Base Equiv): ORAL | 90 days supply | Qty: 90 | Fill #0 | Status: AC

## 2021-03-15 MED FILL — Losartan Potassium Tab 100 MG: ORAL | 90 days supply | Qty: 90 | Fill #0 | Status: AC

## 2021-03-15 MED FILL — Valacyclovir HCl Tab 1 GM: ORAL | 30 days supply | Qty: 90 | Fill #0 | Status: AC

## 2021-03-16 ENCOUNTER — Other Ambulatory Visit (HOSPITAL_COMMUNITY): Payer: Self-pay

## 2021-03-16 MED ORDER — LIVALO 1 MG PO TABS
1.0000 | ORAL_TABLET | Freq: Every day | ORAL | 3 refills | Status: DC
  Filled 2021-03-16: qty 90, 90d supply, fill #0
  Filled 2021-06-13: qty 90, 90d supply, fill #1
  Filled 2021-09-04: qty 90, 90d supply, fill #2
  Filled 2021-12-07: qty 90, 90d supply, fill #3

## 2021-03-16 NOTE — Telephone Encounter (Signed)
Rx(s) sent to pharmacy electronically.  

## 2021-03-21 MED FILL — Alirocumab Subcutaneous Solution Auto-Injector 150 MG/ML: SUBCUTANEOUS | 28 days supply | Qty: 2 | Fill #1 | Status: AC

## 2021-03-22 ENCOUNTER — Other Ambulatory Visit (HOSPITAL_COMMUNITY): Payer: Self-pay

## 2021-03-24 DIAGNOSIS — M25562 Pain in left knee: Secondary | ICD-10-CM | POA: Diagnosis not present

## 2021-03-25 ENCOUNTER — Encounter: Payer: 59 | Admitting: Physical Therapy

## 2021-04-08 ENCOUNTER — Encounter: Payer: 59 | Admitting: Physical Therapy

## 2021-04-18 MED FILL — Alirocumab Subcutaneous Solution Auto-Injector 150 MG/ML: SUBCUTANEOUS | 28 days supply | Qty: 2 | Fill #2 | Status: AC

## 2021-04-19 ENCOUNTER — Other Ambulatory Visit (HOSPITAL_COMMUNITY): Payer: Self-pay

## 2021-04-22 ENCOUNTER — Encounter: Payer: 59 | Admitting: Physical Therapy

## 2021-05-12 DIAGNOSIS — M25562 Pain in left knee: Secondary | ICD-10-CM | POA: Diagnosis not present

## 2021-05-17 ENCOUNTER — Other Ambulatory Visit: Payer: Self-pay | Admitting: Family Medicine

## 2021-05-17 MED FILL — Alirocumab Subcutaneous Solution Auto-Injector 150 MG/ML: SUBCUTANEOUS | 28 days supply | Qty: 2 | Fill #3 | Status: AC

## 2021-05-18 ENCOUNTER — Other Ambulatory Visit (HOSPITAL_COMMUNITY): Payer: Self-pay

## 2021-05-19 ENCOUNTER — Other Ambulatory Visit (HOSPITAL_COMMUNITY): Payer: Self-pay

## 2021-05-19 ENCOUNTER — Other Ambulatory Visit: Payer: Self-pay | Admitting: Family Medicine

## 2021-05-24 ENCOUNTER — Other Ambulatory Visit (HOSPITAL_COMMUNITY): Payer: Self-pay

## 2021-05-24 MED ORDER — ATENOLOL 50 MG PO TABS
50.0000 mg | ORAL_TABLET | Freq: Every day | ORAL | 0 refills | Status: DC
  Filled 2021-05-24: qty 90, 90d supply, fill #0

## 2021-06-13 ENCOUNTER — Other Ambulatory Visit: Payer: Self-pay | Admitting: Family Medicine

## 2021-06-13 MED FILL — Alirocumab Subcutaneous Solution Auto-Injector 150 MG/ML: SUBCUTANEOUS | 28 days supply | Qty: 2 | Fill #4 | Status: AC

## 2021-06-13 MED FILL — Losartan Potassium Tab 100 MG: ORAL | 90 days supply | Qty: 90 | Fill #1 | Status: CN

## 2021-06-13 MED FILL — Pantoprazole Sodium EC Tab 40 MG (Base Equiv): ORAL | 90 days supply | Qty: 90 | Fill #1 | Status: AC

## 2021-06-14 ENCOUNTER — Other Ambulatory Visit: Payer: Self-pay | Admitting: Internal Medicine

## 2021-06-14 ENCOUNTER — Other Ambulatory Visit (HOSPITAL_COMMUNITY): Payer: Self-pay

## 2021-06-14 DIAGNOSIS — M25562 Pain in left knee: Secondary | ICD-10-CM | POA: Diagnosis not present

## 2021-06-14 MED ORDER — LOSARTAN POTASSIUM 100 MG PO TABS
100.0000 mg | ORAL_TABLET | Freq: Every day | ORAL | 2 refills | Status: DC
  Filled 2021-06-14: qty 90, 90d supply, fill #0

## 2021-06-15 ENCOUNTER — Other Ambulatory Visit (HOSPITAL_COMMUNITY): Payer: Self-pay

## 2021-06-15 ENCOUNTER — Other Ambulatory Visit: Payer: Self-pay | Admitting: Family Medicine

## 2021-06-15 ENCOUNTER — Other Ambulatory Visit: Payer: Self-pay

## 2021-06-15 MED ORDER — HYDROCHLOROTHIAZIDE 25 MG PO TABS
25.0000 mg | ORAL_TABLET | Freq: Every day | ORAL | 0 refills | Status: DC
  Filled 2021-06-15: qty 90, 90d supply, fill #0

## 2021-06-30 ENCOUNTER — Other Ambulatory Visit: Payer: Self-pay | Admitting: Orthopaedic Surgery

## 2021-07-11 MED FILL — Valacyclovir HCl Tab 1 GM: ORAL | 90 days supply | Qty: 90 | Fill #1 | Status: AC

## 2021-07-11 MED FILL — Alirocumab Subcutaneous Solution Auto-Injector 150 MG/ML: SUBCUTANEOUS | 28 days supply | Qty: 2 | Fill #5 | Status: AC

## 2021-07-12 ENCOUNTER — Other Ambulatory Visit (HOSPITAL_COMMUNITY): Payer: Self-pay

## 2021-07-22 NOTE — Patient Instructions (Addendum)
DUE TO COVID-19 ONLY ONE VISITOR IS ALLOWED TO COME WITH YOU AND STAY IN THE WAITING ROOM ONLY DURING PRE OP AND PROCEDURE.   **NO VISITORS ARE ALLOWED IN THE SHORT STAY AREA OR RECOVERY ROOM!!**        Your procedure is scheduled on:  Tuesday, 07-24-21   Report to [facility] Main  Entrance     Report to admitting at 12:30 PM   Call this number if you have problems the morning of surgery [phone]   Do not eat food :After Midnight.   May have liquids until 11:50 AM day of surgery  CLEAR LIQUID DIET  Foods Allowed                                                                     Foods Excluded  Water, Black Coffee (no milk/no creamer) and tea, regular and decaf                              liquids that you cannot  Plain Jell-O in any flavor  (No red)                         see through such as: Fruit ices (not with fruit pulp)                                 milk, soups, orange juice  Iced Popsicles (No red)                                    All solid food                             Apple juices Sports drinks like Gatorade (No red) Lightly seasoned clear broth or consume(fat free) Sugar    Complete one Ensure drink the morning of surgery at 11:50 AM the day of surgery.     The day of surgery:  Drink ONE (1) Pre-Surgery Clear Ensurethe morning of surgery. Drink in one sitting. Do not sip.  This drink was given to you during your hospital  pre-op appointment visit. Nothing else to drink after completing the Pre-Surgery Clear Ensure.          If you have questions, please contact your surgeon's office.     Oral Hygiene is also important to reduce your risk of infection.                                    Remember - BRUSH YOUR TEETH THE MORNING OF SURGERY WITH YOUR REGULAR TOOTHPASTE   Do NOT smoke after Midnight   Take these medicines the morning of surgery with A SIP OF WATER: pantaprazole   Stop all vitamins and herbal supplements a week before  surgery              You may not have any metal on your body including  hair pins, jewelry, and body piercing             Do not wear make-up, lotions, powders, perfumes or deodorant  Do not wear nail polish including gel and S&S, artificial/acrylic nails, or any other type of covering on natural nails including finger and toenails. If you have artificial nails, gel coating, etc. that needs to be removed by a nail salon please have this removed prior to surgery or surgery may need to be canceled/ delayed if the surgeon/ anesthesia feels like they are unable to be safely monitored.   Do not shave  48 hours prior to surgery.            Do not bring valuables to the hospital. [place].   Contacts, dentures or bridgework may not be worn into surgery.   Patients discharged the day of surgery will not be allowed to drive home.  Special Instructions: Bring a copy of your healthcare power of attorney and living will documents the day of surgery if you haven't scanned them in before.  Please read over the following fact sheets you were given: IF YOU HAVE QUESTIONS ABOUT YOUR PRE OP INSTRUCTIONS PLEASE CALL [phone]   [place] - Preparing for Surgery Before surgery, you can play an important role.  Because skin is not sterile, your skin needs to be as free of germs as possible.  You can reduce the number of germs on your skin by washing with CHG (chlorahexidine gluconate) soap before surgery.  CHG is an antiseptic cleaner which kills germs and bonds with the skin to continue killing germs even after washing. Please DO NOT use if you have an allergy to CHG or antibacterial soaps.  If your skin becomes reddened/irritated stop using the CHG and inform your nurse when you arrive at Short Stay. Do not shave (including legs and underarms) for at least 48 hours prior to the first CHG shower.  You may shave your face/neck.  Please follow these instructions  carefully:  1.  Shower with CHG Soap the night before surgery and the  morning of surgery.  2.  If you choose to wash your hair, wash your hair first as usual with your normal  shampoo.  3.  After you shampoo, rinse your hair and body thoroughly to remove the shampoo.                             4.  Use CHG as you would any other liquid soap.  You can apply chg directly to the skin and wash.  Gently with a scrungie or clean washcloth.  5.  Apply the CHG Soap to your body ONLY FROM THE NECK DOWN.   Do   not use on face/ open                           Wound or open sores. Avoid contact with eyes, ears mouth and   genitals (private parts).                       Wash face,  Genitals (private parts) with your normal soap.             6.  Wash thoroughly, paying special attention to the area where your    surgery  will be performed.  7.  Thoroughly rinse your body with  warm water from the neck down.  8.  DO NOT shower/wash with your normal soap after using and rinsing off the CHG Soap.                9.  Pat yourself dry with a clean towel.            10.  Wear clean pajamas.            11.  Place clean sheets on your bed the night of your first shower and do not  sleep with pets. Day of Surgery : Do not apply any lotions/deodorants the morning of surgery.  Please wear clean clothes to the hospital/surgery center.  FAILURE TO FOLLOW THESE INSTRUCTIONS MAY RESULT IN THE CANCELLATION OF YOUR SURGERY  PATIENT SIGNATURE_________________________________  NURSE SIGNATURE__________________________________  ________________________________________________________________________   Adam Phenix  An incentive spirometer is a tool that can help keep your lungs clear and active. This tool measures how well you are filling your lungs with each breath. Taking long deep breaths may help reverse or decrease the chance of developing breathing (pulmonary) problems (especially infection) following: A long  period of time when you are unable to move or be active. BEFORE THE PROCEDURE  If the spirometer includes an indicator to show your best effort, your nurse or respiratory therapist will set it to a desired goal. If possible, sit up straight or lean slightly forward. Try not to slouch. Hold the incentive spirometer in an upright position. INSTRUCTIONS FOR USE  Sit on the edge of your bed if possible, or sit up as far as you can in bed or on a chair. Hold the incentive spirometer in an upright position. Breathe out normally. Place the mouthpiece in your mouth and seal your lips tightly around it. Breathe in slowly and as deeply as possible, raising the piston or the ball toward the top of the column. Hold your breath for 3-5 seconds or for as long as possible. Allow the piston or ball to fall to the bottom of the column. Remove the mouthpiece from your mouth and breathe out normally. Rest for a few seconds and repeat Steps 1 through 7 at least 10 times every 1-2 hours when you are awake. Take your time and take a few normal breaths between deep breaths. The spirometer may include an indicator to show your best effort. Use the indicator as a goal to work toward during each repetition. After each set of 10 deep breaths, practice coughing to be sure your lungs are clear. If you have an incision (the cut made at the time of surgery), support your incision when coughing by placing a pillow or rolled up towels firmly against it. Once you are able to get out of bed, walk around indoors and cough well. You may stop using the incentive spirometer when instructed by your caregiver.  RISKS AND COMPLICATIONS Take your time so you do not get dizzy or light-headed. If you are in pain, you may need to take or ask for pain medication before doing incentive spirometry. It is harder to take a deep breath if you are having pain. AFTER USE Rest and breathe slowly and easily. It can be helpful to keep track of a log  of your progress. Your caregiver can provide you with a simple table to help with this. If you are using the spirometer at home, follow these instructions: [place] IF:  You are having difficultly using the spirometer. You have trouble using the  spirometer as often as instructed. Your pain medication is not giving enough relief while using the spirometer. You develop fever of 100.5 F (38.1 C) or higher. SEEK IMMEDIATE MEDICAL CARE IF:  You cough up bloody sputum that had not been present before. You develop fever of 102 F (38.9 C) or greater. You develop worsening pain at or near the incision site. MAKE SURE YOU:  Understand these instructions. Will watch your condition. Will get help right away if you are not doing well or get worse. Document Released: 03/13/2007 Document Revised: 01/23/2012 Document Reviewed: 05/14/2007 [facility] Patient Information 2014 [place], [place].   ________________________________________________________________________

## 2021-07-23 ENCOUNTER — Encounter (HOSPITAL_COMMUNITY)
Admission: RE | Admit: 2021-07-23 | Discharge: 2021-07-23 | Disposition: A | Discharge status: Home / Self Care | Payer: 59 | Source: Ambulatory Visit | Attending: Orthopaedic Surgery | Admitting: Orthopaedic Surgery

## 2021-07-23 ENCOUNTER — Encounter (HOSPITAL_COMMUNITY): Payer: Self-pay

## 2021-07-23 ENCOUNTER — Other Ambulatory Visit: Payer: Self-pay

## 2021-07-23 DIAGNOSIS — Z01818 Encounter for other preprocedural examination: Secondary | ICD-10-CM | POA: Insufficient documentation

## 2021-07-23 HISTORY — DX: Hemorrhagic condition, unspecified: D69.9

## 2021-07-23 LAB — CBC
HCT: 35.8 % — ABNORMAL LOW (ref 36.0–46.0)
Hemoglobin: 11.7 g/dL — ABNORMAL LOW (ref 12.0–15.0)
MCH: 33 pg (ref 26.0–34.0)
MCHC: 32.7 g/dL (ref 30.0–36.0)
MCV: 100.8 fL — ABNORMAL HIGH (ref 80.0–100.0)
Platelets: 204 10*3/uL (ref 150–400)
RBC: 3.55 MIL/uL — ABNORMAL LOW (ref 3.87–5.11)
RDW: 11.9 % (ref 11.5–15.5)
WBC: 5.3 10*3/uL (ref 4.0–10.5)
nRBC: 0 % (ref 0.0–0.2)

## 2021-07-23 LAB — BASIC METABOLIC PANEL
Anion gap: 12 (ref 5–15)
BUN: 28 mg/dL — ABNORMAL HIGH (ref 8–23)
CO2: 28 mmol/L (ref 22–32)
Calcium: 9.9 mg/dL (ref 8.9–10.3)
Chloride: 101 mmol/L (ref 98–111)
Creatinine, Ser: 0.85 mg/dL (ref 0.44–1.00)
GFR, Estimated: >60 mL/min (ref >60)
Glucose, Bld: 97 mg/dL (ref 70–99)
Potassium: 4 mmol/L (ref 3.5–5.1)
Sodium: 141 mmol/L (ref 135–145)

## 2021-07-23 NOTE — Progress Notes (Addendum)
PCP - Scarlette Calico  MD not seen yet Cardiologist - Talbot Grumbling, MD saw once after sinus surgery for CP Ruled out Sees Chrissie Noa hilty for lipid clinic  PPM/ICD -  Device Orders -  Rep Notified -   Chest x-ray -  EKG -  Stress Test -  ECHO -  Cardiac Cath -   Sleep Study -  CPAP -   Fasting Blood Sugar -  Checks Blood Sugar _____ times a day  Blood Thinner Instructions: Aspirin Instructions:  ERAS Protcol - PRE-SURGERY Ensure or G2-   COVID TEST- Ambulatory  COVID vaccine - Vaccine pfizer x 3   Activity--Able to walk a flight of stairs without SOB Anesthesia review: HTN  Patient denies shortness of breath, fever, cough and chest pain at PAT appointment   All instructions explained to the patient, with a verbal understanding of the material. Patient agrees to go over the instructions while at home for a better understanding. Patient also instructed to self quarantine after being tested for COVID-19. The opportunity to ask questions was provided.

## 2021-08-02 NOTE — H&P (Signed)
[name] [name] is an 60 y.o. female.   Chief Complaint: left knee pain HPI: [name] [name] continues with inside aspect left knee pain.  This has been going on for around a year at this point.  She continues to work light duty about 20 hours a week.  Apparently her case is going to court.  She has trouble resting and cannot squat down.  She just wants her knee fixed.   Imaging/Tests: I reviewed an MRI scan films and report of a study done at [facility] on 09/21/20.  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  I can see no evidence of fracture nor can the radiologist.  Past Medical History:  Diagnosis Date  . Bleeds easily ([place])   . Cerebral aneurysm 1999   Surgically coiled  . DDD (degenerative disc disease)   . GERD (gastroesophageal reflux disease)   . History of Barrett's esophagus   . Hypercholesterolemia   . Hypertension   . Irritable bowel disease   . Sinus disease     Past Surgical History:  Procedure Laterality Date  . ABDOMINAL HYSTERECTOMY  2005  . ANAL RECTAL MANOMETRY N/A 09/14/2020   Procedure: ANO RECTAL MANOMETRY;  Surgeon: Ileana Roup, MD;  Location: [facility];  Service: General;  Laterality: N/A;  . APPENDECTOMY    . ARTHROTOMY Right 12/11/2015   Procedure: OPEN ARTHROTOMY ;  Surgeon: Roseanne Kaufman, MD;  Location: [place];  Service: Orthopedics;  Laterality: Right;  . AUGMENTATION MAMMAPLASTY Bilateral   . BREAST ENHANCEMENT SURGERY    . CEREBRAL ANEURYSM REPAIR  1999   8 platinum coils  . DIAGNOSTIC LAPAROSCOPY    . LUMBAR DISC SURGERY Left 06/03/2010   Procedure: ANTEROLATERAL DECOMPRESSION AND ARTHRODESIS L3-4, L4-5  . NASAL SINUS SURGERY  2013  . ORIF ANKLE FRACTURE  03/01/2012   Procedure: OPEN REDUCTION INTERNAL FIXATION (ORIF) ANKLE FRACTURE;  Surgeon: Wylene Simmer, MD;  Location: [place];  Service: Orthopedics;   Laterality: Right;  Open reduction internal fixation right navicular fracture  . REFRACTIVE SURGERY    . SHOULDER ARTHROSCOPY Left   . SYNOVECTOMY Right 12/11/2015   Procedure: SYNOVECTOMY DISTAL RADIOULNAR JOINT LOOSE BODY REMOVAL ;  Surgeon: Roseanne Kaufman, MD;  Location: [place];  Service: Orthopedics;  Laterality: Right;  . WRIST ARTHROSCOPY Right 12/11/2015   Procedure: RIGHT WRIST ARTHROSCOPY WITH DEBRIDEMENT ;  Surgeon: Roseanne Kaufman, MD;  Location: [place];  Service: Orthopedics;  Laterality: Right;    Family History  Problem Relation Age of Onset  . Hypertension Father   . Hypertension Mother   . Osteopenia Mother   . Osteopenia Maternal Grandmother    Social History:  reports that she quit smoking about 36 years ago. Her smoking use included cigarettes. She has never used smokeless tobacco. She reports current alcohol use of about 1.0 standard drink per week. She reports that she does not use drugs.  Allergies:  Allergies  Allergen Reactions  . Hydrocodone-Acetaminophen Itching  . Lipitor [Atorvastatin Calcium] Other (See Comments)    Elevated liver enzymes  . Morphine And Related Nausea Only  . Tetracyclines & Related Rash    Break out on all extremties    No medications prior to admission.    No results found for this or any previous visit (from the past 48 hour(s)). No results found.  Review of Systems  Musculoskeletal:  Positive for arthralgias.       Left knee  All other systems reviewed and are negative.  There were no vitals taken for this visit. Physical Exam Constitutional:      Appearance: Normal appearance.  HENT:     Head: Normocephalic and atraumatic.     Nose: Nose normal.     Mouth/Throat:     Mouth: Mucous membranes are moist.     Pharynx: Oropharynx is clear.  Eyes:     Extraocular Movements: Extraocular movements intact.  Cardiovascular:     Rate and Rhythm: Normal rate and regular rhythm.   Pulmonary:     Effort: Pulmonary effort is normal.  Abdominal:     Palpations: Abdomen is soft.  Musculoskeletal:     Comments: Exam is the same with full range of motion and pain on hyperflexion.  She has medial joint line pain and McMurray test which is positive for pain and a bit of a pop in the medial direction.  Hip motion is full and painfree and SLR is negative on both sides.  There is no palpable LAD behind either knee.  Sensation and motor function are intact on both sides and there are palpable pulses on both sides.  Skin:    General: Skin is warm and dry.  Neurological:     General: No focal deficit present.     Mental Status: She is alert and oriented to person, place, and time. Mental status is at baseline.  Psychiatric:        Mood and Affect: Mood normal.        Behavior: Behavior normal.        Thought Content: Thought content normal.        Judgment: Judgment normal.     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  I still think we can help her with a partial medial meniscectomy and chondroplasty. I reviewed risks of anesthesia and infection as well as potential for DVT related to a knee arthroscopy.  I've stressed the importance of some postoperative physical therapy to optimize results and we will try to set up an appointment.  Two to four  weeks for recovery would be typical but that is a little variable.    [name] [name] [name] Davenport, PA-C 08/02/2021, 2:40 PM

## 2021-08-03 ENCOUNTER — Encounter (HOSPITAL_COMMUNITY): Payer: Self-pay | Admitting: Orthopaedic Surgery

## 2021-08-03 ENCOUNTER — Ambulatory Visit (HOSPITAL_COMMUNITY)
Admission: RE | Admit: 2021-08-03 | Discharge: 2021-08-03 | Disposition: A | Discharge status: Home / Self Care | Payer: 59 | Attending: Orthopaedic Surgery | Admitting: Orthopaedic Surgery

## 2021-08-03 ENCOUNTER — Ambulatory Visit (HOSPITAL_COMMUNITY): Payer: 59 | Admitting: Anesthesiology

## 2021-08-03 ENCOUNTER — Other Ambulatory Visit (HOSPITAL_COMMUNITY): Payer: Self-pay

## 2021-08-03 ENCOUNTER — Encounter (HOSPITAL_COMMUNITY)
Admission: RE | Disposition: A | Discharge status: Home / Self Care | Payer: Self-pay | Source: Home / Self Care | Attending: Orthopaedic Surgery

## 2021-08-03 ENCOUNTER — Other Ambulatory Visit: Payer: Self-pay

## 2021-08-03 DIAGNOSIS — M2242 Chondromalacia patellae, left knee: Secondary | ICD-10-CM | POA: Diagnosis not present

## 2021-08-03 DIAGNOSIS — X58XXXA Exposure to other specified factors, initial encounter: Secondary | ICD-10-CM | POA: Insufficient documentation

## 2021-08-03 DIAGNOSIS — Z9049 Acquired absence of other specified parts of digestive tract: Secondary | ICD-10-CM | POA: Diagnosis not present

## 2021-08-03 DIAGNOSIS — Z8249 Family history of ischemic heart disease and other diseases of the circulatory system: Secondary | ICD-10-CM | POA: Diagnosis not present

## 2021-08-03 DIAGNOSIS — S83242A Other tear of medial meniscus, current injury, left knee, initial encounter: Secondary | ICD-10-CM | POA: Diagnosis not present

## 2021-08-03 DIAGNOSIS — Z881 Allergy status to other antibiotic agents status: Secondary | ICD-10-CM | POA: Diagnosis not present

## 2021-08-03 DIAGNOSIS — Z87891 Personal history of nicotine dependence: Secondary | ICD-10-CM | POA: Diagnosis not present

## 2021-08-03 DIAGNOSIS — M25562 Pain in left knee: Secondary | ICD-10-CM | POA: Diagnosis present

## 2021-08-03 DIAGNOSIS — I10 Essential (primary) hypertension: Secondary | ICD-10-CM | POA: Insufficient documentation

## 2021-08-03 DIAGNOSIS — Z885 Allergy status to narcotic agent status: Secondary | ICD-10-CM | POA: Diagnosis not present

## 2021-08-03 DIAGNOSIS — E7800 Pure hypercholesterolemia, unspecified: Secondary | ICD-10-CM | POA: Insufficient documentation

## 2021-08-03 DIAGNOSIS — Z888 Allergy status to other drugs, medicaments and biological substances status: Secondary | ICD-10-CM | POA: Insufficient documentation

## 2021-08-03 DIAGNOSIS — K219 Gastro-esophageal reflux disease without esophagitis: Secondary | ICD-10-CM | POA: Diagnosis not present

## 2021-08-03 DIAGNOSIS — M23304 Other meniscus derangements, unspecified medial meniscus, left knee: Secondary | ICD-10-CM | POA: Diagnosis not present

## 2021-08-03 DIAGNOSIS — Z8719 Personal history of other diseases of the digestive system: Secondary | ICD-10-CM | POA: Diagnosis not present

## 2021-08-03 HISTORY — PX: KNEE ARTHROSCOPY: SHX127

## 2021-08-03 SURGERY — ARTHROSCOPY, KNEE
Anesthesia: General | Site: Knee | Laterality: Left

## 2021-08-03 MED ORDER — FENTANYL CITRATE (PF) 100 MCG/2ML IJ SOLN
INTRAMUSCULAR | Status: AC
  Filled 2021-08-03: qty 2

## 2021-08-03 MED ORDER — ONDANSETRON HCL 4 MG/2ML IJ SOLN
INTRAMUSCULAR | Status: AC
  Filled 2021-08-03: qty 2

## 2021-08-03 MED ORDER — BUPIVACAINE-EPINEPHRINE 0.5% -1:200000 IJ SOLN
INTRAMUSCULAR | Status: DC | PRN
  Administered 2021-08-03: 15 mL

## 2021-08-03 MED ORDER — LIDOCAINE HCL (PF) 2 % IJ SOLN
INTRAMUSCULAR | Status: AC
  Filled 2021-08-03: qty 5

## 2021-08-03 MED ORDER — FENTANYL CITRATE PF 50 MCG/ML IJ SOSY: 25.0000 ug | PREFILLED_SYRINGE | INTRAMUSCULAR | Status: DC | PRN

## 2021-08-03 MED ORDER — BUPIVACAINE-EPINEPHRINE (PF) 0.5% -1:200000 IJ SOLN
INTRAMUSCULAR | Status: AC
  Filled 2021-08-03: qty 30

## 2021-08-03 MED ORDER — EPHEDRINE 5 MG/ML INJ
INTRAVENOUS | Status: AC
  Filled 2021-08-03: qty 5

## 2021-08-03 MED ORDER — EPHEDRINE SULFATE-NACL 50-0.9 MG/10ML-% IV SOSY
PREFILLED_SYRINGE | INTRAVENOUS | Status: DC | PRN
  Administered 2021-08-03: 5 mg via INTRAVENOUS

## 2021-08-03 MED ORDER — TRAMADOL HCL 50 MG PO TABS
50.0000 mg | ORAL_TABLET | Freq: Four times a day (QID) | ORAL | 0 refills | Status: DC | PRN
  Filled 2021-08-03: qty 10, 2d supply, fill #0

## 2021-08-03 MED ORDER — EPINEPHRINE PF 1 MG/ML IJ SOLN
INTRAMUSCULAR | Status: AC
  Filled 2021-08-03: qty 1

## 2021-08-03 MED ORDER — PROPOFOL 10 MG/ML IV BOLUS
INTRAVENOUS | Status: DC | PRN
  Administered 2021-08-03: 200 mg via INTRAVENOUS

## 2021-08-03 MED ORDER — SODIUM CHLORIDE 0.9 % IR SOLN
Status: DC | PRN
  Administered 2021-08-03: 3000 mL

## 2021-08-03 MED ORDER — CEFAZOLIN SODIUM-DEXTROSE 2-4 GM/100ML-% IV SOLN
2.0000 g | INTRAVENOUS | Status: AC
  Administered 2021-08-03: 2 g via INTRAVENOUS
  Filled 2021-08-03: qty 100

## 2021-08-03 MED ORDER — ORAL CARE MOUTH RINSE: 15.0000 mL | Freq: Once | OROMUCOSAL | Status: AC

## 2021-08-03 MED ORDER — ONDANSETRON HCL 4 MG/2ML IJ SOLN
INTRAMUSCULAR | Status: DC | PRN
  Administered 2021-08-03: 4 mg via INTRAVENOUS

## 2021-08-03 MED ORDER — CHLORHEXIDINE GLUCONATE 0.12 % MT SOLN
15.0000 mL | Freq: Once | OROMUCOSAL | Status: AC
  Administered 2021-08-03: 15 mL via OROMUCOSAL

## 2021-08-03 MED ORDER — PROPOFOL 10 MG/ML IV BOLUS
INTRAVENOUS | Status: AC
  Filled 2021-08-03: qty 20

## 2021-08-03 MED ORDER — LACTATED RINGERS IV SOLN: INTRAVENOUS | Status: DC

## 2021-08-03 MED ORDER — DEXAMETHASONE SODIUM PHOSPHATE 10 MG/ML IJ SOLN
INTRAMUSCULAR | Status: DC | PRN
  Administered 2021-08-03: 5 mg via INTRAVENOUS

## 2021-08-03 MED ORDER — LIDOCAINE HCL (CARDIAC) PF 100 MG/5ML IV SOSY
PREFILLED_SYRINGE | INTRAVENOUS | Status: DC | PRN
  Administered 2021-08-03: 40 mg via INTRAVENOUS

## 2021-08-03 MED ORDER — PROMETHAZINE HCL 25 MG/ML IJ SOLN: 6.2500 mg | INTRAMUSCULAR | Status: DC | PRN

## 2021-08-03 MED ORDER — EPINEPHRINE (ANAPHYLAXIS) 1 MG/ML IJ SOLN
INTRAMUSCULAR | Status: DC | PRN
  Administered 2021-08-03: 1 mg

## 2021-08-03 MED ORDER — FENTANYL CITRATE (PF) 100 MCG/2ML IJ SOLN
INTRAMUSCULAR | Status: DC | PRN
  Administered 2021-08-03: 50 ug via INTRAVENOUS
  Administered 2021-08-03 (×2): 25 ug via INTRAVENOUS
  Administered 2021-08-03: 50 ug via INTRAVENOUS

## 2021-08-03 SURGICAL SUPPLY — 31 items
BAG COUNTER SPONGE SURGICOUNT (BAG) ×2 IMPLANT
BLADE EXCALIBUR 4.0X13 (MISCELLANEOUS) ×2 IMPLANT
BNDG ELASTIC 6X5.8 VLCR STR LF (GAUZE/BANDAGES/DRESSINGS) ×2 IMPLANT
BNDG GAUZE ELAST 4 BULKY (GAUZE/BANDAGES/DRESSINGS) ×2 IMPLANT
COVER SURGICAL LIGHT HANDLE (MISCELLANEOUS) IMPLANT
DISSECTOR 3.5MM X 13CM (MISCELLANEOUS) IMPLANT
DRAPE ARTHROSCOPY W/POUCH 114 (DRAPES) ×2 IMPLANT
DRAPE SHEET LG 3/4 BI-LAMINATE (DRAPES) ×2 IMPLANT
DRAPE U-SHAPE 47X51 STRL (DRAPES) ×2 IMPLANT
DRSG EMULSION OIL 3X3 NADH (GAUZE/BANDAGES/DRESSINGS) ×2 IMPLANT
DRSG PAD ABDOMINAL 8X10 ST (GAUZE/BANDAGES/DRESSINGS) ×4 IMPLANT
DURAPREP 26ML APPLICATOR (WOUND CARE) ×4 IMPLANT
GAUZE SPONGE 4X4 12PLY STRL (GAUZE/BANDAGES/DRESSINGS) ×2 IMPLANT
GLOVE SRG 8 PF TXTR STRL LF DI (GLOVE) ×2 IMPLANT
GLOVE SURG ENC MOIS LTX SZ8 (GLOVE) ×4 IMPLANT
GLOVE SURG UNDER POLY LF SZ8 (GLOVE) ×2
GOWN STRL REUS W/ TWL XL LVL3 (GOWN DISPOSABLE) ×2 IMPLANT
GOWN STRL REUS W/TWL XL LVL3 (GOWN DISPOSABLE) ×2
KIT BASIN OR (CUSTOM PROCEDURE TRAY) ×2 IMPLANT
KIT TURNOVER KIT A (KITS) ×2 IMPLANT
MANIFOLD NEPTUNE II (INSTRUMENTS) ×2 IMPLANT
NEEDLE HYPO 22GX1.5 SAFETY (NEEDLE) ×2 IMPLANT
NEEDLE SPNL 18GX3.5 QUINCKE PK (NEEDLE) IMPLANT
PACK ARTHROSCOPY WL (CUSTOM PROCEDURE TRAY) ×2 IMPLANT
PAD ARMBOARD 7.5X6 YLW CONV (MISCELLANEOUS) ×4 IMPLANT
PENCIL SMOKE EVACUATOR (MISCELLANEOUS) IMPLANT
SYR CONTROL 10ML LL (SYRINGE) ×2 IMPLANT
TOWEL OR 17X26 10 PK STRL BLUE (TOWEL DISPOSABLE) ×2 IMPLANT
TUBING ARTHROSCOPY IRRIG 16FT (MISCELLANEOUS) ×2 IMPLANT
WATER STERILE IRR 1000ML POUR (IV SOLUTION) ×2 IMPLANT
WRAP KNEE MAXI GEL POST OP (GAUZE/BANDAGES/DRESSINGS) ×2 IMPLANT

## 2021-08-03 NOTE — Brief Op Note (Signed)
[name] [name] [id number] 08/03/2021   PRE-OP DIAGNOSIS: left knee TMM and CP  POST-OP DIAGNOSIS: same  PROCEDURE: left knee scope PMM  ANESTHESIA: general  Hessie Dibble   Dictation #:  [id number]

## 2021-08-03 NOTE — Interval H&P Note (Signed)
History and Physical Interval Note:  08/03/2021 1:55 PM  [name] [name]  has presented today for surgery, with the diagnosis of LEFT KNEE MEDIAL MENISCUS [place].  The various methods of treatment have been discussed with the patient and family. After consideration of risks, benefits and other options for treatment, the patient has consented to  Procedure(s): LEFT KNEE ARTHROSCOPY (Left) as a surgical intervention.  The patient's history has been reviewed, patient examined, no change in status, stable for surgery.  I have reviewed the patient's chart and labs.  Questions were answered to the patient's satisfaction.     Hessie Dibble

## 2021-08-03 NOTE — Interval H&P Note (Signed)
History and Physical Interval Note:  08/03/2021 1:52 PM  [name] [name]  has presented today for surgery, with the diagnosis of LEFT KNEE MEDIAL MENISCUS [place].  The various methods of treatment have been discussed with the patient and family. After consideration of risks, benefits and other options for treatment, the patient has consented to  Procedure(s): LEFT KNEE ARTHROSCOPY (Left) as a surgical intervention.  The patient's history has been reviewed, patient examined, no change in status, stable for surgery.  I have reviewed the patient's chart and labs.  Questions were answered to the patient's satisfaction.     Hessie Dibble

## 2021-08-03 NOTE — Transfer of Care (Signed)
Immediate Anesthesia Transfer of Care Note  Patient: [name] [name]  Procedure(s) Performed: LEFT KNEE ARTHROSCOPY, PARTIAL MEDIAL MENISCECTOMY AND CONDROPLASTY (Left: Knee)  Patient Location: PACU  Anesthesia Type:General  Level of Consciousness: awake, alert , oriented, drowsy and patient cooperative  Airway & Oxygen Therapy: Patient Spontanous Breathing and Patient connected to face mask oxygen  Post-op Assessment: Report given to RN and Post -op Vital signs reviewed and stable  Post vital signs: Reviewed and stable  Last Vitals:  Vitals Value Taken Time  BP 122/75 08/03/21 1516  Temp    Pulse 79 08/03/21 1518  Resp 15 08/03/21 1518  SpO2 97 % 08/03/21 1518  Vitals shown include unvalidated device data.  Last Pain:  Vitals:   08/03/21 1240  TempSrc:   PainSc: 0-No pain         Complications: No notable events documented.

## 2021-08-03 NOTE — Anesthesia Preprocedure Evaluation (Addendum)
Anesthesia Evaluation  Patient identified by MRN, date of birth, ID band Patient awake    Reviewed: Allergy & Precautions, NPO status , Patient's Chart, lab work & pertinent test results, reviewed documented beta blocker date and time   History of Anesthesia Complications Negative for: history of anesthetic complications  Airway Mallampati: II  TM Distance: >3 FB Neck ROM: Full    Dental  (+) Dental Advisory Given, Teeth Intact   Pulmonary neg pulmonary ROS, former smoker,    Pulmonary exam normal        Cardiovascular hypertension, Pt. on medications and Pt. on home beta blockers Normal cardiovascular exam     Neuro/Psych  Cerebral aneurysm s/p coiling  negative psych ROS   GI/Hepatic Neg liver ROS, GERD  Medicated and Controlled, IBS    Endo/Other  negative endocrine ROS  Renal/GU negative Renal ROS     Musculoskeletal  (+) Arthritis ,   Abdominal   Peds  Hematology  (+) anemia ,  Hx easy bleeding, no specific dx provided    Anesthesia Other Findings   Reproductive/Obstetrics                            Anesthesia Physical Anesthesia Plan  ASA: 2  Anesthesia Plan: General   Post-op Pain Management:    Induction: Intravenous  PONV Risk Score and Plan: 3 and Treatment may vary due to age or medical condition, Ondansetron, Dexamethasone and Midazolam  Airway Management Planned: LMA  Additional Equipment: None  Intra-op Plan:   Post-operative Plan: Extubation in OR  Informed Consent: I have reviewed the patients History and Physical, chart, labs and discussed the procedure including the risks, benefits and alternatives for the proposed anesthesia with the patient or authorized representative who has indicated his/her understanding and acceptance.     Dental advisory given  Plan Discussed with: CRNA and Anesthesiologist  Anesthesia Plan Comments:         Anesthesia Quick Evaluation

## 2021-08-03 NOTE — Anesthesia Postprocedure Evaluation (Signed)
Anesthesia Post Note  Patient: [name] [name]  Procedure(s) Performed: LEFT KNEE ARTHROSCOPY, PARTIAL MEDIAL MENISCECTOMY AND CONDROPLASTY (Left: Knee)     Patient location during evaluation: PACU Anesthesia Type: General Level of consciousness: awake and alert Pain management: pain level controlled Vital Signs Assessment: post-procedure vital signs reviewed and stable Respiratory status: spontaneous breathing, nonlabored ventilation and respiratory function stable Cardiovascular status: stable and blood pressure returned to baseline Anesthetic complications: no   No notable events documented.  Last Vitals:  Vitals:   08/03/21 1608 08/03/21 1615  BP: 131/88 127/87  Pulse: (!) 58 (!) 59  Resp: 20 18  Temp: 36.6 C 36.6 C  SpO2: 97% 95%    Last Pain:  Vitals:   08/03/21 1615  TempSrc:   PainSc: Lucas Marc Leichter

## 2021-08-03 NOTE — Op Note (Signed)
[name], [name] MEDICAL RECORD NO: [id number] ACCOUNT NO: [id number] DATE OF BIRTH: March 11, 1961 FACILITY: [facility] LOCATION: [facility] PHYSICIAN: Monico Blitz. Rhona Raider, MD  Operative Report   DATE OF PROCEDURE: 08/03/2021  PREOPERATIVE DIAGNOSES: 1.  Left knee torn medial meniscus. 2.  Left knee chondromalacia patella.  POSTOPERATIVE DIAGNOSES: 1.  Left knee torn medial meniscus. 2.  Left knee chondromalacia patella.  PROCEDURE:   1.  Left knee partial medial meniscectomy. 2.  Left knee abrasion chondroplasty, patellofemoral.  ANESTHESIA:  General.  ATTENDING SURGEON:  Melrose Nakayama, MD  ASSISTANT:  Loni Dolly, PA  INDICATIONS:  The patient is a 60 year old woman with a long history of left knee pain which initially started with an injury.  She had persisted with difficulty despite multiple conservative measures.  By MRI scan, she was known to have a medial  meniscus tear and some chondromalacia.  She is offered an arthroscopy.  Informed operative consent was obtained after discussion of the possible complications including reaction to anesthesia and infection.  SUMMARY OF FINDINGS AND PROCEDURE:  Under general anesthesia, an arthroscopy of the left knee was performed.  Suprapatellar pouch was benign while the patellofemoral joint exhibited some focal grade III change along with some small grade IV change.  A  chondroplasty was done with abrasion of bleeding bone in one small area.  In the medial compartment, she had some grade III change, but no exposed bone and a brief chondroplasty was done there.  She did have a complex degenerative tear of the posterior  horn of the medial meniscus, addressed with about a 10% partial medial meniscectomy.  ACL looked normal and the lateral compartment was completely benign.  She was scheduled to go home same day.  DESCRIPTION OF PROCEDURE:  The patient was taken to the operating suite where general anesthetic was applied without difficulty.  She  was positioned supine and prepped and draped in normal sterile fashion.  After the administration of preoperative IV  Kefzol and appropriate time-out, an arthroscopy of the left knee was performed through a total of 2 portals.  Findings were as noted above and procedure consisted predominantly of the partial medial meniscectomy which was done with a basket and shaver  back to stable tissues.  We then performed abrasion chondroplasty as described above patellofemoral and a brief chondroplasty elsewhere.  The knee was thoroughly irrigated, followed by removal of arthroscopic equipment.  Adaptic was applied to the  portals followed by dry gauze and a loose Ace wrap.  Estimated blood loss and intraoperative fluid can be obtained from anesthesia records.  DISPOSITION:  The patient was extubated in the operating room and taken to recovery room in stable condition.  Plans were for her to go home same day and to follow up in the office in less than a week.  I will contact her by phone tonight.   SHW D: 08/03/2021 3:18:40 pm T: 08/03/2021 11:09:00 pm  JOB: [id number]/ [id number]

## 2021-08-03 NOTE — Anesthesia Procedure Notes (Signed)
Procedure Name: LMA Insertion Date/Time: 08/03/2021 2:41 PM Performed by: Raenette Rover, CRNA Pre-anesthesia Checklist: Patient identified, Emergency Drugs available, Suction available and Patient being monitored Patient Re-evaluated:Patient Re-evaluated prior to induction Oxygen Delivery Method: Circle system utilized Preoxygenation: Pre-oxygenation with 100% oxygen Induction Type: IV induction Ventilation: Mask ventilation without difficulty LMA: LMA inserted LMA Size: 4.0 Number of attempts: 1 Placement Confirmation: positive ETCO2 and breath sounds checked- equal and bilateral Tube secured with: Tape Dental Injury: Teeth and Oropharynx as per pre-operative assessment

## 2021-08-04 ENCOUNTER — Encounter (HOSPITAL_COMMUNITY): Payer: Self-pay | Admitting: Orthopaedic Surgery

## 2021-08-09 DIAGNOSIS — S83212D Bucket-handle tear of medial meniscus, current injury, left knee, subsequent encounter: Secondary | ICD-10-CM | POA: Diagnosis not present

## 2021-08-09 DIAGNOSIS — M25662 Stiffness of left knee, not elsewhere classified: Secondary | ICD-10-CM | POA: Diagnosis not present

## 2021-08-11 DIAGNOSIS — M25662 Stiffness of left knee, not elsewhere classified: Secondary | ICD-10-CM | POA: Diagnosis not present

## 2021-08-11 DIAGNOSIS — S83212D Bucket-handle tear of medial meniscus, current injury, left knee, subsequent encounter: Secondary | ICD-10-CM | POA: Diagnosis not present

## 2021-08-13 ENCOUNTER — Other Ambulatory Visit (HOSPITAL_COMMUNITY): Payer: Self-pay

## 2021-08-13 MED FILL — Alirocumab Subcutaneous Solution Auto-Injector 150 MG/ML: SUBCUTANEOUS | 28 days supply | Qty: 2 | Fill #6 | Status: AC

## 2021-08-18 ENCOUNTER — Encounter: Payer: 59 | Admitting: Internal Medicine

## 2021-08-27 DIAGNOSIS — M25662 Stiffness of left knee, not elsewhere classified: Secondary | ICD-10-CM | POA: Diagnosis not present

## 2021-08-27 DIAGNOSIS — S83212D Bucket-handle tear of medial meniscus, current injury, left knee, subsequent encounter: Secondary | ICD-10-CM | POA: Diagnosis not present

## 2021-08-31 DIAGNOSIS — M25662 Stiffness of left knee, not elsewhere classified: Secondary | ICD-10-CM | POA: Diagnosis not present

## 2021-08-31 DIAGNOSIS — S83212D Bucket-handle tear of medial meniscus, current injury, left knee, subsequent encounter: Secondary | ICD-10-CM | POA: Diagnosis not present

## 2021-09-03 ENCOUNTER — Encounter: Payer: Self-pay | Admitting: Family Medicine

## 2021-09-03 ENCOUNTER — Other Ambulatory Visit (HOSPITAL_COMMUNITY): Payer: Self-pay

## 2021-09-03 ENCOUNTER — Telehealth (INDEPENDENT_AMBULATORY_CARE_PROVIDER_SITE_OTHER): Payer: 59 | Admitting: Family Medicine

## 2021-09-03 ENCOUNTER — Other Ambulatory Visit: Payer: Self-pay | Admitting: Family Medicine

## 2021-09-03 VITALS — Ht 69.0 in | Wt 165.0 lb

## 2021-09-03 DIAGNOSIS — K2270 Barrett's esophagus without dysplasia: Secondary | ICD-10-CM | POA: Diagnosis not present

## 2021-09-03 DIAGNOSIS — S83212D Bucket-handle tear of medial meniscus, current injury, left knee, subsequent encounter: Secondary | ICD-10-CM | POA: Diagnosis not present

## 2021-09-03 DIAGNOSIS — K219 Gastro-esophageal reflux disease without esophagitis: Secondary | ICD-10-CM | POA: Diagnosis not present

## 2021-09-03 DIAGNOSIS — H524 Presbyopia: Secondary | ICD-10-CM | POA: Diagnosis not present

## 2021-09-03 DIAGNOSIS — M25662 Stiffness of left knee, not elsewhere classified: Secondary | ICD-10-CM | POA: Diagnosis not present

## 2021-09-03 DIAGNOSIS — I10 Essential (primary) hypertension: Secondary | ICD-10-CM

## 2021-09-03 MED ORDER — PANTOPRAZOLE SODIUM 40 MG PO TBEC
DELAYED_RELEASE_TABLET | Freq: Every day | ORAL | 0 refills | Status: DC
  Filled 2021-09-03: qty 90, 90d supply, fill #0

## 2021-09-03 MED ORDER — HYDROCHLOROTHIAZIDE 25 MG PO TABS
25.0000 mg | ORAL_TABLET | Freq: Every day | ORAL | 0 refills | Status: DC
  Filled 2021-09-03: qty 90, 90d supply, fill #0

## 2021-09-03 MED ORDER — LOSARTAN POTASSIUM 100 MG PO TABS
100.0000 mg | ORAL_TABLET | Freq: Every day | ORAL | 0 refills | Status: DC
  Filled 2021-09-03: qty 90, 90d supply, fill #0

## 2021-09-03 MED ORDER — ATENOLOL 50 MG PO TABS
50.0000 mg | ORAL_TABLET | Freq: Every day | ORAL | 0 refills | Status: DC
  Filled 2021-09-03: qty 90, 90d supply, fill #0

## 2021-09-03 NOTE — Assessment & Plan Note (Signed)
Refill protonix 32m daily.  She has tolerated well without side effects.

## 2021-09-03 NOTE — Assessment & Plan Note (Signed)
Has seen GI in the past.  Will refill Protonix 40 mg daily.

## 2021-09-03 NOTE — Assessment & Plan Note (Signed)
We will refill her current regimen of atenolol 50 mg daily, HCTZ 25 mg daily, and losartan 100 mg daily.  She will follow-up with her new PCP in a couple of months.  Discussed home blood pressure monitoring goal 140/90 or lower.

## 2021-09-03 NOTE — Progress Notes (Signed)
   [name] [name] is a 60 y.o. female who presents today for a virtual office visit.  Assessment/Plan:  Chronic Problems Addressed Today: Benign hypertension We will refill her current regimen of atenolol 50 mg daily, HCTZ 25 mg daily, and losartan 100 mg daily.  She will follow-up with her new PCP in a couple of months.  Discussed home blood pressure monitoring goal 140/90 or lower.   GERD (gastroesophageal reflux disease) Has seen GI in the past.  Will refill Protonix 40 mg daily.  Barrett esophagus Refill protonix 68m daily.  She has tolerated well without side effects.     Subjective:  HPI:  See A/p for status of chronic conditions. Her previous PCP no longer works at this office. She will be establishing care with a doctor at another office in 2 months but needs a refill on her medications.        Objective/Observations  Physical Exam: Gen: NAD, resting comfortably Pulm: Normal work of breathing Neuro: Grossly normal, moves all extremities Psych: Normal affect and thought content  Virtual Visit via Video   I connected with CSAE HANDRICHon 09/03/21 at  3:40 PM EDT by a video enabled telemedicine application and verified that I am speaking with the correct person using two identifiers. The limitations of evaluation and management by telemedicine and the availability of in person appointments were discussed. The patient expressed understanding and agreed to proceed.   Patient location: [place]: Myself and Patient     CAlgis Greenhouse PJerline Pain MD 09/03/2021 12:33 PM

## 2021-09-04 MED FILL — Alirocumab Subcutaneous Solution Auto-Injector 150 MG/ML: SUBCUTANEOUS | 28 days supply | Qty: 2 | Fill #7 | Status: AC

## 2021-09-06 ENCOUNTER — Other Ambulatory Visit (HOSPITAL_COMMUNITY): Payer: Self-pay

## 2021-09-07 DIAGNOSIS — M25662 Stiffness of left knee, not elsewhere classified: Secondary | ICD-10-CM | POA: Diagnosis not present

## 2021-09-07 DIAGNOSIS — S83212D Bucket-handle tear of medial meniscus, current injury, left knee, subsequent encounter: Secondary | ICD-10-CM | POA: Diagnosis not present

## 2021-09-10 DIAGNOSIS — M25662 Stiffness of left knee, not elsewhere classified: Secondary | ICD-10-CM | POA: Diagnosis not present

## 2021-09-10 DIAGNOSIS — S83212D Bucket-handle tear of medial meniscus, current injury, left knee, subsequent encounter: Secondary | ICD-10-CM | POA: Diagnosis not present

## 2021-09-14 ENCOUNTER — Other Ambulatory Visit (HOSPITAL_COMMUNITY): Payer: Self-pay

## 2021-09-14 DIAGNOSIS — M25662 Stiffness of left knee, not elsewhere classified: Secondary | ICD-10-CM | POA: Diagnosis not present

## 2021-09-14 DIAGNOSIS — S83212D Bucket-handle tear of medial meniscus, current injury, left knee, subsequent encounter: Secondary | ICD-10-CM | POA: Diagnosis not present

## 2021-09-14 MED ORDER — CEQUA 0.09 % OP SOLN
1.0000 [drp] | Freq: Two times a day (BID) | OPHTHALMIC | 11 refills | Status: DC
  Filled 2021-09-14: qty 60, 30d supply, fill #0

## 2021-09-16 DIAGNOSIS — S83212D Bucket-handle tear of medial meniscus, current injury, left knee, subsequent encounter: Secondary | ICD-10-CM | POA: Diagnosis not present

## 2021-09-16 DIAGNOSIS — M25662 Stiffness of left knee, not elsewhere classified: Secondary | ICD-10-CM | POA: Diagnosis not present

## 2021-09-17 ENCOUNTER — Other Ambulatory Visit (HOSPITAL_COMMUNITY): Payer: Self-pay

## 2021-09-17 MED ORDER — CYCLOSPORINE 0.05 % OP EMUL
1.0000 [drp] | Freq: Two times a day (BID) | OPHTHALMIC | 11 refills | Status: DC
  Filled 2021-09-17: qty 60, 30d supply, fill #0

## 2021-09-21 DIAGNOSIS — M25662 Stiffness of left knee, not elsewhere classified: Secondary | ICD-10-CM | POA: Diagnosis not present

## 2021-09-21 DIAGNOSIS — S83212D Bucket-handle tear of medial meniscus, current injury, left knee, subsequent encounter: Secondary | ICD-10-CM | POA: Diagnosis not present

## 2021-09-23 DIAGNOSIS — S83212D Bucket-handle tear of medial meniscus, current injury, left knee, subsequent encounter: Secondary | ICD-10-CM | POA: Diagnosis not present

## 2021-09-23 DIAGNOSIS — M25662 Stiffness of left knee, not elsewhere classified: Secondary | ICD-10-CM | POA: Diagnosis not present

## 2021-09-28 DIAGNOSIS — M25662 Stiffness of left knee, not elsewhere classified: Secondary | ICD-10-CM | POA: Diagnosis not present

## 2021-09-28 DIAGNOSIS — S83212D Bucket-handle tear of medial meniscus, current injury, left knee, subsequent encounter: Secondary | ICD-10-CM | POA: Diagnosis not present

## 2021-09-30 DIAGNOSIS — M25662 Stiffness of left knee, not elsewhere classified: Secondary | ICD-10-CM | POA: Diagnosis not present

## 2021-09-30 DIAGNOSIS — S83212D Bucket-handle tear of medial meniscus, current injury, left knee, subsequent encounter: Secondary | ICD-10-CM | POA: Diagnosis not present

## 2021-10-03 MED FILL — Alirocumab Subcutaneous Solution Auto-Injector 150 MG/ML: SUBCUTANEOUS | 28 days supply | Qty: 2 | Fill #8 | Status: AC

## 2021-10-04 ENCOUNTER — Other Ambulatory Visit (HOSPITAL_COMMUNITY): Payer: Self-pay

## 2021-10-04 DIAGNOSIS — M25662 Stiffness of left knee, not elsewhere classified: Secondary | ICD-10-CM | POA: Diagnosis not present

## 2021-10-04 DIAGNOSIS — S83212D Bucket-handle tear of medial meniscus, current injury, left knee, subsequent encounter: Secondary | ICD-10-CM | POA: Diagnosis not present

## 2021-10-06 ENCOUNTER — Other Ambulatory Visit (HOSPITAL_COMMUNITY): Payer: Self-pay

## 2021-10-12 DIAGNOSIS — M25662 Stiffness of left knee, not elsewhere classified: Secondary | ICD-10-CM | POA: Diagnosis not present

## 2021-10-12 DIAGNOSIS — S83212D Bucket-handle tear of medial meniscus, current injury, left knee, subsequent encounter: Secondary | ICD-10-CM | POA: Diagnosis not present

## 2021-10-14 DIAGNOSIS — M25662 Stiffness of left knee, not elsewhere classified: Secondary | ICD-10-CM | POA: Diagnosis not present

## 2021-10-14 DIAGNOSIS — S83212D Bucket-handle tear of medial meniscus, current injury, left knee, subsequent encounter: Secondary | ICD-10-CM | POA: Diagnosis not present

## 2021-10-15 ENCOUNTER — Other Ambulatory Visit: Payer: Self-pay | Admitting: Family Medicine

## 2021-10-15 DIAGNOSIS — Z1231 Encounter for screening mammogram for malignant neoplasm of breast: Secondary | ICD-10-CM

## 2021-10-19 DIAGNOSIS — S83212D Bucket-handle tear of medial meniscus, current injury, left knee, subsequent encounter: Secondary | ICD-10-CM | POA: Diagnosis not present

## 2021-10-19 DIAGNOSIS — M25662 Stiffness of left knee, not elsewhere classified: Secondary | ICD-10-CM | POA: Diagnosis not present

## 2021-10-21 DIAGNOSIS — M25662 Stiffness of left knee, not elsewhere classified: Secondary | ICD-10-CM | POA: Diagnosis not present

## 2021-10-21 DIAGNOSIS — S83212D Bucket-handle tear of medial meniscus, current injury, left knee, subsequent encounter: Secondary | ICD-10-CM | POA: Diagnosis not present

## 2021-10-26 DIAGNOSIS — M25662 Stiffness of left knee, not elsewhere classified: Secondary | ICD-10-CM | POA: Diagnosis not present

## 2021-10-26 DIAGNOSIS — S83212D Bucket-handle tear of medial meniscus, current injury, left knee, subsequent encounter: Secondary | ICD-10-CM | POA: Diagnosis not present

## 2021-10-26 NOTE — Progress Notes (Signed)
[place] [facility] Phone: [phone] Subjective:   [name], am serving as a scribe for Dr. Hulan Saas.  This visit occurred during the SARS-CoV-2 public health emergency.  Safety protocols were in place, including screening questions prior to the visit, additional usage of staff PPE, and extensive cleaning of exam room while observing appropriate contact time as indicated for disinfecting solutions.   I'm seeing this patient by the request  of:  No primary care provider on file.  CC: Left hip pain, back pain  CWC:[id number]  [name] [name] is a 60 y.o. female coming in with complaint of L knee pain. Had surgery on 08/03/2021. Patient states that she has not been back to work since surgery. Hopes to be released next week. Using celebrex for inflammation in the knee that is chronic due to her getting COVID and not getting into PT. Continues to do PT.   Here today to discuss L hip pain over greater trochanter. Pain when she lies on L side. No pain otherwise due to not working.  Patient states that she has not been working since the surgery.  Patient is to go back to work here soon.  Patient is still concerned that if she works for 10-hour shifts in a row she would start having increasing discomfort and pain again.  Would like to potentially continue the same restrictions were patient does have a risk in between 2 shifts.      Past Medical History:  Diagnosis Date   Bleeds easily ([place])    Cerebral aneurysm 1999   Surgically coiled   DDD (degenerative disc disease)    GERD (gastroesophageal reflux disease)    History of Barrett's esophagus    Hypercholesterolemia    Hypertension    Irritable bowel disease    Sinus disease    Past Surgical History:  Procedure Laterality Date   ABDOMINAL HYSTERECTOMY  2005   ANAL RECTAL MANOMETRY N/A 09/14/2020   Procedure: ANO RECTAL MANOMETRY;  Surgeon: Ileana Roup, MD;   Location: [facility];  Service: General;  Laterality: N/A;   APPENDECTOMY     ARTHROTOMY Right 12/11/2015   Procedure: OPEN ARTHROTOMY ;  Surgeon: Roseanne Kaufman, MD;  Location: [facility];  Service: Orthopedics;  Laterality: Right;   AUGMENTATION MAMMAPLASTY Bilateral    BREAST ENHANCEMENT SURGERY     CEREBRAL ANEURYSM REPAIR  1999   8 platinum coils   DIAGNOSTIC LAPAROSCOPY     KNEE ARTHROSCOPY Left 08/03/2021   Procedure: LEFT KNEE ARTHROSCOPY, PARTIAL MEDIAL MENISCECTOMY AND CONDROPLASTY;  Surgeon: Melrose Nakayama, MD;  Location: [facility];  Service: Orthopedics;  Laterality: Left;   LUMBAR DISC SURGERY Left 06/03/2010   Procedure: ANTEROLATERAL DECOMPRESSION AND ARTHRODESIS L3-4, L4-5   NASAL SINUS SURGERY  2013   ORIF ANKLE FRACTURE  03/01/2012   Procedure: OPEN REDUCTION INTERNAL FIXATION (ORIF) ANKLE FRACTURE;  Surgeon: Wylene Simmer, MD;  Location: [facility];  Service: Orthopedics;  Laterality: Right;  Open reduction internal fixation right navicular fracture   REFRACTIVE SURGERY     SHOULDER ARTHROSCOPY Left    SYNOVECTOMY Right 12/11/2015   Procedure: SYNOVECTOMY DISTAL RADIOULNAR JOINT LOOSE BODY REMOVAL ;  Surgeon: Roseanne Kaufman, MD;  Location: [facility];  Service: Orthopedics;  Laterality: Right;   WRIST ARTHROSCOPY Right 12/11/2015   Procedure: RIGHT WRIST ARTHROSCOPY WITH DEBRIDEMENT ;  Surgeon: Roseanne Kaufman, MD;  Location: [facility];  Service:  Orthopedics;  Laterality: Right;   Social History   Socioeconomic History   Marital status: Divorced    Spouse name: Not on file   Number of children: Not on file   Years of education: Not on file   Highest education level: Not on file  Occupational History    Employer: [employer]  Tobacco Use   Smoking status: Former    Types: Cigarettes    Quit date: 11/14/1984    Years since quitting: 36.9   Smokeless tobacco: Never  Vaping Use   Vaping Use: Never used   Substance and Sexual Activity   Alcohol use: Yes    Alcohol/week: 1.0 standard drink    Types: 1 Glasses of wine per week    Comment: social   Drug use: No   Sexual activity: Not Currently  Other Topics Concern   Not on file  Social History Narrative   The patient works as a Marine scientist in the electrophysiology department. Her daughter and 108-monthold granddaughter live with her. She does not smoke cigarettes.   Social Determinants of Health   Financial Resource Strain: Not on file  Food Insecurity: Not on file  Transportation Needs: Not on file  Physical Activity: Not on file  Stress: Not on file  Social Connections: Not on file   Allergies  Allergen Reactions   Hydrocodone-Acetaminophen Itching   Lipitor [Atorvastatin Calcium] Other (See Comments)    Elevated liver enzymes   Morphine And Related Nausea Only   Tetracyclines & Related Rash    Break out on all extremties   Family History  Problem Relation Age of Onset   Hypertension Father    Hypertension Mother    Osteopenia Mother    Osteopenia Maternal Grandmother      Current Outpatient Medications (Cardiovascular):    Alirocumab 150 MG/ML SOAJ, INJECT 150 MG AS DIRECTED EVERY 14 (FOURTEEN) DAYS.   atenolol (TENORMIN) 50 MG tablet, Take 1 tablet (50 mg total) by mouth at bedtime.   hydrochlorothiazide (HYDRODIURIL) 25 MG tablet, Take 1 tablet (25 mg total) by mouth daily.   losartan (COZAAR) 100 MG tablet, Take 1 tablet (100 mg total) by mouth daily.   Pitavastatin Calcium (LIVALO) 1 MG TABS, Take 1 tablet (1 mg total) by mouth daily.     Current Outpatient Medications (Other):    Calcium Citrate-Vitamin D 315-250 MG-UNIT TABS, Take 1 tablet by mouth 2 (two) times daily.   cycloSPORINE (RESTASIS) 0.05 % ophthalmic emulsion, Instill 1 drop into both eyes twice a day   MULTIPLE VITAMINS-MINERALS PO, Take 1 tablet by mouth daily.   Omega-3 Fatty Acids (FISH OIL) 1000 MG CAPS, Take 1,000 mg by mouth daily.    pantoprazole (PROTONIX) 40 MG tablet, TAKE 1 TABLET (40 MG TOTAL) BY MOUTH DAILY.   Turmeric 500 MG CAPS, Take 500 mg by mouth 2 (two) times daily.   valACYclovir (VALTREX) 1000 MG tablet, TAKE 1/2 TABLET BY MOUTH DAILY FOR SUPPRESSION, TAKE 2 TABLETS BY MOUTH EVERY MORNING AND EVENING FOR 1 DAY AS NEEDED   cycloSPORINE, PF, (CEQUA) 0.09 % SOLN, Instill 1 drop into both eyes twice a day   Reviewed prior external information including notes and imaging from  primary care provider As well as notes that were available from care everywhere and other healthcare systems.  Reviewed patient's notes from some physical therapy.  Reviewed patient's surgical notes as well for the left knee arthroscopy as well as the chondroplasty.  Past medical history, social, surgical and family history  all reviewed in electronic medical record.  No pertanent information unless stated regarding to the chief complaint.   Review of Systems:  No headache, visual changes, nausea, vomiting, diarrhea, constipation, dizziness, abdominal pain, skin rash, fevers, chills, night sweats, weight loss, swollen lymph nodes, body aches, joint swelling, chest pain, shortness of breath, mood changes. POSITIVE muscle aches  Objective  Blood pressure 134/90, pulse 72, height 5' 9"  (1.753 m), weight 172 lb (78 kg), SpO2 97 %.   General: No apparent distress alert and oriented x3 mood and affect normal, dressed appropriately.  HEENT: Pupils equal, extraocular movements intact  Respiratory: Patient's speak in full sentences and does not appear short of breath  Cardiovascular: No lower extremity edema, non tender, no erythema  Gait antalgic gait MSK: Patient's left knee still very mild tenderness to palpation.  Does have tenderness over the greater trochanteric area on the left side.  Patient has tightness with straight leg test but no true radicular symptoms.   Impression and Recommendations:     The above documentation has been reviewed  and is accurate and complete Lyndal Pulley, DO

## 2021-10-27 ENCOUNTER — Ambulatory Visit: Payer: 59 | Admitting: Family Medicine

## 2021-10-27 ENCOUNTER — Encounter: Payer: Self-pay | Admitting: Family Medicine

## 2021-10-27 ENCOUNTER — Other Ambulatory Visit: Payer: Self-pay

## 2021-10-27 DIAGNOSIS — M8700 Idiopathic aseptic necrosis of unspecified bone: Secondary | ICD-10-CM | POA: Diagnosis not present

## 2021-10-27 DIAGNOSIS — M25552 Pain in left hip: Secondary | ICD-10-CM

## 2021-10-27 NOTE — Assessment & Plan Note (Signed)
Patient continues to have the left hip pain.  MRI has not shown anything specific.  Seems to be more secondary to a lumbar radiculopathy.  Has responded to physical therapy previously.  Discussed the potential for injection which patient declined today.  Discussed starting to increase activity and see how patient responds.  Patient will follow up again in 6 to 8 weeks for further evaluation if necessary.  Given the same restrictions patient has had previously as well.

## 2021-10-27 NOTE — Assessment & Plan Note (Signed)
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  I do believe that we may need to consider another imaging study.  Patient will be following up with her orthopedic surgeon to discuss neck steps if anything is necessary at this time.

## 2021-10-27 NOTE — Patient Instructions (Addendum)
Letter written for work accommodations GT exercises Ice hip before bed Worsening pain consider injection IF back acts up write to [place] See Dr. Latanya Maudlin and ask about AVN See me as needed

## 2021-11-02 DIAGNOSIS — M25662 Stiffness of left knee, not elsewhere classified: Secondary | ICD-10-CM | POA: Diagnosis not present

## 2021-11-02 DIAGNOSIS — S83212D Bucket-handle tear of medial meniscus, current injury, left knee, subsequent encounter: Secondary | ICD-10-CM | POA: Diagnosis not present

## 2021-11-05 DIAGNOSIS — Z9889 Other specified postprocedural states: Secondary | ICD-10-CM | POA: Diagnosis not present

## 2021-11-05 DIAGNOSIS — M25562 Pain in left knee: Secondary | ICD-10-CM | POA: Diagnosis not present

## 2021-11-08 ENCOUNTER — Other Ambulatory Visit: Payer: Self-pay | Admitting: Internal Medicine

## 2021-11-08 DIAGNOSIS — R931 Abnormal findings on diagnostic imaging of heart and coronary circulation: Secondary | ICD-10-CM

## 2021-11-08 DIAGNOSIS — E782 Mixed hyperlipidemia: Secondary | ICD-10-CM

## 2021-11-09 ENCOUNTER — Other Ambulatory Visit (HOSPITAL_COMMUNITY): Payer: Self-pay

## 2021-11-09 MED ORDER — PRALUENT 150 MG/ML ~~LOC~~ SOAJ
150.0000 mg | SUBCUTANEOUS | 0 refills | Status: DC
  Filled 2021-11-09: qty 2, 28d supply, fill #0
  Filled 2021-12-07: qty 2, 28d supply, fill #1
  Filled 2021-12-26: qty 2, 28d supply, fill #2

## 2021-11-12 ENCOUNTER — Other Ambulatory Visit (HOSPITAL_COMMUNITY): Payer: Self-pay

## 2021-11-18 ENCOUNTER — Other Ambulatory Visit: Payer: Self-pay

## 2021-11-18 ENCOUNTER — Encounter: Payer: Self-pay | Admitting: Internal Medicine

## 2021-11-18 ENCOUNTER — Ambulatory Visit: Payer: 59 | Admitting: Internal Medicine

## 2021-11-18 ENCOUNTER — Other Ambulatory Visit (HOSPITAL_COMMUNITY): Payer: Self-pay

## 2021-11-18 VITALS — BP 158/94 | HR 70 | Temp 98.2°F | Resp 16 | Ht 69.0 in | Wt 170.0 lb

## 2021-11-18 DIAGNOSIS — D539 Nutritional anemia, unspecified: Secondary | ICD-10-CM | POA: Diagnosis not present

## 2021-11-18 DIAGNOSIS — I10 Essential (primary) hypertension: Secondary | ICD-10-CM

## 2021-11-18 DIAGNOSIS — Z0001 Encounter for general adult medical examination with abnormal findings: Secondary | ICD-10-CM

## 2021-11-18 DIAGNOSIS — K7581 Nonalcoholic steatohepatitis (NASH): Secondary | ICD-10-CM

## 2021-11-18 DIAGNOSIS — Z1231 Encounter for screening mammogram for malignant neoplasm of breast: Secondary | ICD-10-CM | POA: Insufficient documentation

## 2021-11-18 DIAGNOSIS — K760 Fatty (change of) liver, not elsewhere classified: Secondary | ICD-10-CM | POA: Diagnosis not present

## 2021-11-18 LAB — CBC WITH DIFFERENTIAL/PLATELET
Basophils Absolute: 0 10*3/uL (ref 0.0–0.1)
Basophils Relative: 0.6 % (ref 0.0–3.0)
Eosinophils Absolute: 0.1 10*3/uL (ref 0.0–0.7)
Eosinophils Relative: 1.2 % (ref 0.0–5.0)
HCT: 37.7 % (ref 36.0–46.0)
Hemoglobin: 12.7 g/dL (ref 12.0–15.0)
Lymphocytes Relative: 29.6 % (ref 12.0–46.0)
Lymphs Abs: 1.8 10*3/uL (ref 0.7–4.0)
MCHC: 33.7 g/dL (ref 30.0–36.0)
MCV: 99.3 fl (ref 78.0–100.0)
Monocytes Absolute: 0.7 10*3/uL (ref 0.1–1.0)
Monocytes Relative: 10.7 % (ref 3.0–12.0)
Neutro Abs: 3.5 10*3/uL (ref 1.4–7.7)
Neutrophils Relative %: 57.9 % (ref 43.0–77.0)
Platelets: 259 10*3/uL (ref 150.0–400.0)
RBC: 3.8 Mil/uL — ABNORMAL LOW (ref 3.87–5.11)
RDW: 12.2 % (ref 11.5–15.5)
WBC: 6.1 10*3/uL (ref 4.0–10.5)

## 2021-11-18 LAB — URINALYSIS, ROUTINE W REFLEX MICROSCOPIC
Bilirubin Urine: NEGATIVE
Hgb urine dipstick: NEGATIVE
Ketones, ur: NEGATIVE
Nitrite: NEGATIVE
Specific Gravity, Urine: 1.015 (ref 1.000–1.030)
Total Protein, Urine: NEGATIVE
Urine Glucose: NEGATIVE
Urobilinogen, UA: 0.2 (ref 0.0–1.0)
pH: 6.5 (ref 5.0–8.0)

## 2021-11-18 LAB — HEPATIC FUNCTION PANEL
ALT: 42 U/L — ABNORMAL HIGH (ref 0–35)
AST: 56 U/L — ABNORMAL HIGH (ref 0–37)
Albumin: 4.6 g/dL (ref 3.5–5.2)
Alkaline Phosphatase: 76 U/L (ref 39–117)
Bilirubin, Direct: 0.1 mg/dL (ref 0.0–0.3)
Total Bilirubin: 0.6 mg/dL (ref 0.2–1.2)
Total Protein: 7.9 g/dL (ref 6.0–8.3)

## 2021-11-18 LAB — BASIC METABOLIC PANEL
BUN: 21 mg/dL (ref 6–23)
CO2: 32 mEq/L (ref 19–32)
Calcium: 10.5 mg/dL (ref 8.4–10.5)
Chloride: 99 mEq/L (ref 96–112)
Creatinine, Ser: 0.88 mg/dL (ref 0.40–1.20)
GFR: 70.43 mL/min (ref >60.00)
Glucose, Bld: 103 mg/dL — ABNORMAL HIGH (ref 70–99)
Potassium: 4.4 mEq/L (ref 3.5–5.1)
Sodium: 140 mEq/L (ref 135–145)

## 2021-11-18 LAB — IBC + FERRITIN
Ferritin: 162.2 ng/mL (ref 10.0–291.0)
Iron: 102 ug/dL (ref 42–145)
Saturation Ratios: 24.5 % (ref 20.0–50.0)
TIBC: 415.8 ug/dL (ref 250.0–450.0)
Transferrin: 297 mg/dL (ref 212.0–360.0)

## 2021-11-18 LAB — PROTIME-INR
INR: 1.1 ratio — ABNORMAL HIGH (ref 0.8–1.0)
Prothrombin Time: 11.7 s (ref 9.6–13.1)

## 2021-11-18 LAB — VITAMIN B12: Vitamin B-12: 540 pg/mL (ref 211–911)

## 2021-11-18 LAB — TSH: TSH: 2.42 u[IU]/mL (ref 0.35–5.50)

## 2021-11-18 LAB — FOLATE: Folate: >24.2 ng/mL (ref >5.9)

## 2021-11-18 MED ORDER — NEBIVOLOL HCL 5 MG PO TABS
5.0000 mg | ORAL_TABLET | Freq: Every day | ORAL | 0 refills | Status: DC
  Filled 2021-11-18: qty 90, 90d supply, fill #0

## 2021-11-18 MED ORDER — INDAPAMIDE 1.25 MG PO TABS
1.2500 mg | ORAL_TABLET | Freq: Every day | ORAL | 0 refills | Status: DC
  Filled 2021-11-18: qty 90, 90d supply, fill #0

## 2021-11-18 MED ORDER — OLMESARTAN MEDOXOMIL 20 MG PO TABS
20.0000 mg | ORAL_TABLET | Freq: Every day | ORAL | 0 refills | Status: DC
  Filled 2021-11-18: qty 90, 90d supply, fill #0

## 2021-11-18 NOTE — Patient Instructions (Signed)

## 2021-11-18 NOTE — Progress Notes (Signed)
Subjective:  Patient ID: [name] [name], female    DOB: 1961-04-17  Age: 61 y.o. MRN: [id number]  CC: Annual Exam, Hypertension, and Anemia  This visit occurred during the SARS-CoV-2 public health emergency.  Safety protocols were in place, including screening questions prior to the visit, additional usage of staff PPE, and extensive cleaning of exam room while observing appropriate contact time as indicated for disinfecting solutions.    HPI [name] [name] presents for a CPX and to establish.  She is concerned her blood pressure is not adequately well controlled.  She is active but does not exercise much because of chronic musculoskeletal pain.  When she is active she does not experience chest pain, shortness of breath, diaphoresis, dizziness, lightheadedness, or edema.  She tells me she is compliant with the current 3 meds.  Outpatient Medications Prior to Visit  Medication Sig Dispense Refill   Alirocumab (PRALUENT) 150 MG/ML SOAJ Inject 150 mg into the skin every 14 (fourteen) days. 6 mL 0   Calcium Citrate-Vitamin D 315-250 MG-UNIT TABS Take 1 tablet by mouth 2 (two) times daily.     celecoxib (CELEBREX) 200 MG capsule Take 200 mg by mouth daily as needed.     cycloSPORINE (RESTASIS) 0.05 % ophthalmic emulsion Instill 1 drop into both eyes twice a day 60 each 11   MULTIPLE VITAMINS-MINERALS PO Take 1 tablet by mouth daily.     Omega-3 Fatty Acids (FISH OIL) 1000 MG CAPS Take 1,000 mg by mouth daily.     pantoprazole (PROTONIX) 40 MG tablet TAKE 1 TABLET (40 MG TOTAL) BY MOUTH DAILY. 90 tablet 0   Pitavastatin Calcium (LIVALO) 1 MG TABS Take 1 tablet (1 mg total) by mouth daily. 90 tablet 3   traMADol (ULTRAM) 50 MG tablet      Turmeric 500 MG CAPS Take 500 mg by mouth 2 (two) times daily.     valACYclovir (VALTREX) 1000 MG tablet TAKE 1/2 TABLET BY MOUTH DAILY FOR SUPPRESSION, TAKE 2 TABLETS BY MOUTH EVERY MORNING AND EVENING FOR 1 DAY AS NEEDED 90 tablet 4   atenolol (TENORMIN) 50 MG  tablet Take 1 tablet (50 mg total) by mouth at bedtime. 90 tablet 0   hydrochlorothiazide (HYDRODIURIL) 25 MG tablet Take 1 tablet (25 mg total) by mouth daily. 90 tablet 0   losartan (COZAAR) 100 MG tablet Take 1 tablet (100 mg total) by mouth daily. 90 tablet 0   cycloSPORINE, PF, (CEQUA) 0.09 % SOLN Instill 1 drop into both eyes twice a day 60 each 11   No facility-administered medications prior to visit.    ROS Review of Systems  Constitutional:  Negative for appetite change, diaphoresis, fatigue and fever.  HENT: Negative.    Eyes: Negative.   Respiratory:  Negative for cough, chest tightness, shortness of breath and wheezing.   Cardiovascular:  Negative for chest pain, palpitations and leg swelling.  Gastrointestinal:  Negative for abdominal pain, constipation, diarrhea and nausea.  Endocrine: Negative.   Genitourinary: Negative.  Negative for difficulty urinating.  Musculoskeletal:  Positive for arthralgias. Negative for myalgias.  Skin: Negative.   Neurological: Negative.  Negative for dizziness and weakness.  Hematological:  Negative for adenopathy. Does not bruise/bleed easily.   Objective:  BP (!) 158/94 (BP Location: Left Arm, Patient Position: Sitting, Cuff Size: Large)    Pulse 70    Temp 98.2 F (36.8 C) (Oral)    Resp 16    Ht 5' 9"  (1.753 m)    Wt  170 lb (77.1 kg)    SpO2 98%    BMI 25.10 kg/m   BP Readings from Last 3 Encounters:  11/18/21 (!) 158/94  10/27/21 134/90  08/03/21 127/87    Wt Readings from Last 3 Encounters:  11/18/21 170 lb (77.1 kg)  10/27/21 172 lb (78 kg)  09/03/21 165 lb (74.8 kg)    Physical Exam Vitals reviewed.  Constitutional:      Appearance: Normal appearance.  HENT:     Nose: Nose normal.     Mouth/Throat:     Mouth: Mucous membranes are moist.  Eyes:     General: No scleral icterus.    Conjunctiva/sclera: Conjunctivae normal.  Cardiovascular:     Rate and Rhythm: Normal rate and regular rhythm.     Heart sounds: No  murmur heard. Pulmonary:     Effort: Pulmonary effort is normal.     Breath sounds: No stridor. No wheezing, rhonchi or rales.  Abdominal:     General: Abdomen is flat. Bowel sounds are normal. There is no distension.     Palpations: Abdomen is soft. There is no hepatomegaly, splenomegaly or mass.     Tenderness: There is no abdominal tenderness.  Musculoskeletal:        General: Normal range of motion.     Cervical back: Neck supple.     Right lower leg: No edema.     Left lower leg: No edema.  Lymphadenopathy:     Cervical: No cervical adenopathy.  Skin:    General: Skin is warm and dry.  Neurological:     General: No focal deficit present.     Mental Status: She is alert.  Psychiatric:        Mood and Affect: Mood normal.        Behavior: Behavior normal.    Lab Results  Component Value Date   WBC 6.1 11/18/2021   HGB 12.7 11/18/2021   HCT 37.7 11/18/2021   PLT 259.0 11/18/2021   GLUCOSE 103 (H) 11/18/2021   CHOL 139 01/13/2021   TRIG 86 01/13/2021   HDL 71 01/13/2021   LDLDIRECT 146.0 11/21/2017   LDLCALC 52 01/13/2021   ALT 42 (H) 11/18/2021   AST 56 (H) 11/18/2021   NA 140 11/18/2021   K 4.4 11/18/2021   CL 99 11/18/2021   CREATININE 0.88 11/18/2021   BUN 21 11/18/2021   CO2 32 11/18/2021   TSH 2.42 11/18/2021   INR 1.1 (H) 11/18/2021   HGBA1C 5.1 12/18/2017   MICROALBUR 2.4 (H) 05/27/2020    No results found.  Assessment & Plan:   March was seen today for annual exam, hypertension and anemia.  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  I will screen for vitamin deficiencies. -     Vitamin B12; Future -     IBC + Ferritin; Future -     CBC with Differential/Platelet; Future -     Folate; Future -     Vitamin B1; Future -     Vitamin B1 -     Folate -     CBC with Differential/Platelet -     IBC + Ferritin -     Vitamin B12  Primary hypertension- Her blood pressure is not adequately well  controlled.  I will check labs to screen for secondary causes and endorgan damage.  I recommended that she upgrade to more effective agents.  She will continue working on her lifestyle  modifications. -     Basic metabolic panel; Future -     TSH; Future -     Urinalysis, Routine w reflex microscopic; Future -     Aldosterone + renin activity w/ ratio; Future -     Aldosterone + renin activity w/ ratio -     Urinalysis, Routine w reflex microscopic -     TSH -     Basic metabolic panel -     nebivolol (BYSTOLIC) 5 MG tablet; Take 1 tablet (5 mg total) by mouth daily. -     olmesartan (BENICAR) 20 MG tablet; Take 1 tablet (20 mg total) by mouth daily. -     indapamide (LOZOL) 1.25 MG tablet; Take 1 tablet (1.25 mg total) by mouth daily.  Encounter for general adult medical examination with abnormal findings- Exam completed, labs reviewed, vaccines reviewed and updated, cancer screenings addressed: She tells me she has a mammogram scheduled, patient education material was given.  NASH (nonalcoholic steatohepatitis)- Her MELD score is reassuring but her liver enzymes remain elevated.  I have asked her to decrease her alcohol intake by at least 50%. -     Hepatic function panel; Future -     Protime-INR; Future -     Protime-INR -     Hepatic function panel  NAFLD (nonalcoholic fatty liver disease)   I have discontinued Criss S. Kimes's atenolol, hydrochlorothiazide, losartan, and Cequa. I am also having her start on nebivolol, olmesartan, and indapamide. Additionally, I am having her maintain her Fish Oil, Calcium Citrate-Vitamin D, MULTIPLE VITAMINS-MINERALS PO, Turmeric, valACYclovir, Livalo, pantoprazole, cycloSPORINE, Praluent, celecoxib, and traMADol.  Meds ordered this encounter  Medications   nebivolol (BYSTOLIC) 5 MG tablet    Sig: Take 1 tablet (5 mg total) by mouth daily.    Dispense:  90 tablet    Refill:  0   olmesartan (BENICAR) 20 MG tablet    Sig: Take 1 tablet (20 mg  total) by mouth daily.    Dispense:  90 tablet    Refill:  0   indapamide (LOZOL) 1.25 MG tablet    Sig: Take 1 tablet (1.25 mg total) by mouth daily.    Dispense:  90 tablet    Refill:  0     Follow-up: Return in about 3 months (around 02/16/2022).  Scarlette Calico, MD

## 2021-11-19 ENCOUNTER — Other Ambulatory Visit (HOSPITAL_COMMUNITY): Payer: Self-pay

## 2021-11-20 ENCOUNTER — Encounter: Payer: Self-pay | Admitting: Internal Medicine

## 2021-11-23 ENCOUNTER — Other Ambulatory Visit (HOSPITAL_COMMUNITY): Payer: Self-pay

## 2021-11-23 MED ORDER — XIIDRA 5 % OP SOLN
1.0000 [drp] | Freq: Two times a day (BID) | OPHTHALMIC | 5 refills | Status: DC
Start: 2021-11-23 — End: 2022-02-10
  Filled 2021-11-23: qty 60, 30d supply, fill #0

## 2021-11-24 ENCOUNTER — Ambulatory Visit
Admission: RE | Admit: 2021-11-24 | Discharge: 2021-11-24 | Disposition: A | Discharge status: Home / Self Care | Payer: 59 | Source: Ambulatory Visit

## 2021-11-24 ENCOUNTER — Other Ambulatory Visit: Payer: Self-pay | Admitting: Family Medicine

## 2021-11-24 DIAGNOSIS — Z1231 Encounter for screening mammogram for malignant neoplasm of breast: Secondary | ICD-10-CM | POA: Diagnosis not present

## 2021-11-24 LAB — ALDOSTERONE + RENIN ACTIVITY W/ RATIO
ALDO / PRA Ratio: 0.9 Ratio (ref 0.9–28.9)
Aldosterone: 7 ng/dL
Renin Activity: 8.03 ng/mL/h — ABNORMAL HIGH (ref 0.25–5.82)

## 2021-11-24 LAB — VITAMIN B1: Vitamin B1 (Thiamine): 20 nmol/L (ref 8–30)

## 2021-11-25 ENCOUNTER — Other Ambulatory Visit: Payer: Self-pay | Admitting: Internal Medicine

## 2021-11-25 DIAGNOSIS — E782 Mixed hyperlipidemia: Secondary | ICD-10-CM

## 2021-11-25 DIAGNOSIS — K760 Fatty (change of) liver, not elsewhere classified: Secondary | ICD-10-CM

## 2021-12-02 NOTE — Progress Notes (Signed)
[place] Phone: [phone] Subjective:   [name], am serving as a scribe for Dr. Hulan Saas. This visit occurred during the SARS-CoV-2 public health emergency.  Safety protocols were in place, including screening questions prior to the visit, additional usage of staff PPE, and extensive cleaning of exam room while observing appropriate contact time as indicated for disinfecting solutions.  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  I do believe that we may need to consider another imaging study.  Patient will be following up with her orthopedic surgeon to discuss neck steps if anything is necessary at this time.  Patient continues to have the left hip pain.  MRI has not shown anything specific.  Seems to be more secondary to a lumbar radiculopathy.  Has responded to physical therapy previously.  Discussed the potential for injection which patient declined today.  Discussed starting to increase activity and see how patient responds.  Patient will follow up again in 6 to 8 weeks for further evaluation if necessary.  Given the same restrictions patient has had previously as well.  Update 12/07/2021 [name] [name] is a 61 y.o. female coming in with complaint of L hip pain. Patient states that her hip is worse. She did take a trip to [facility] and for past 10 days her pain has increased and she feels like ligament pops out of place and leg becomes weak or wanting to give out on her.   Had L knee injection by Dr. Latanya Maudlin. Will see Dr. Latanya Maudlin tomorrow.   Unable to work with the letter that we have written. Returned to work on January 9th. Has to have letter that shows that has no restrictions. She has not had any changes in medical condition.  Patient has been told  potentially loss of employment if she continues to have the restriction or possibly having to move somewhere else.       Past Medical History:  Diagnosis Date   Bleeds easily ([place])    Cerebral aneurysm 1999   Surgically coiled   DDD (degenerative disc disease)    GERD (gastroesophageal reflux disease)    History of Barrett's esophagus    Hypercholesterolemia    Hypertension    Irritable bowel disease    Sinus disease    Past Surgical History:  Procedure Laterality Date   ABDOMINAL HYSTERECTOMY  2005   ANAL RECTAL MANOMETRY N/A 09/14/2020   Procedure: ANO RECTAL MANOMETRY;  Surgeon: Ileana Roup, MD;  Location: [facility];  Service: General;  Laterality: N/A;   APPENDECTOMY     ARTHROTOMY Right 12/11/2015   Procedure: OPEN ARTHROTOMY ;  Surgeon: Roseanne Kaufman, MD;  Location: [place];  Service: Orthopedics;  Laterality: Right;   AUGMENTATION MAMMAPLASTY Bilateral    BREAST ENHANCEMENT SURGERY     CEREBRAL ANEURYSM REPAIR  1999   8 platinum coils   DIAGNOSTIC LAPAROSCOPY     KNEE ARTHROSCOPY Left 08/03/2021   Procedure: LEFT KNEE ARTHROSCOPY, PARTIAL MEDIAL MENISCECTOMY AND CONDROPLASTY;  Surgeon: Melrose Nakayama, MD;  Location: [facility];  Service: Orthopedics;  Laterality: Left;   LUMBAR DISC SURGERY Left 06/03/2010   Procedure: ANTEROLATERAL DECOMPRESSION AND ARTHRODESIS L3-4, L4-5   NASAL SINUS SURGERY  2013   ORIF ANKLE FRACTURE  03/01/2012   Procedure: OPEN REDUCTION INTERNAL FIXATION (ORIF) ANKLE FRACTURE;  Surgeon: Wylene Simmer, MD;  Location: [facility];  Service: Orthopedics;  Laterality: Right;  Open reduction internal fixation right navicular fracture   REFRACTIVE SURGERY     SHOULDER ARTHROSCOPY Left    SYNOVECTOMY Right 12/11/2015   Procedure: SYNOVECTOMY DISTAL RADIOULNAR JOINT LOOSE BODY REMOVAL ;  Surgeon: Roseanne Kaufman, MD;  Location: [facility];  Service: Orthopedics;  Laterality: Right;   WRIST  ARTHROSCOPY Right 12/11/2015   Procedure: RIGHT WRIST ARTHROSCOPY WITH DEBRIDEMENT ;  Surgeon: Roseanne Kaufman, MD;  Location: [place];  Service: Orthopedics;  Laterality: Right;   Social History   Socioeconomic History   Marital status: Divorced    Spouse name: Not on file   Number of children: Not on file   Years of education: Not on file   Highest education level: Not on file  Occupational History    Employer: [employer]  Tobacco Use   Smoking status: Former    Types: Cigarettes    Quit date: 11/14/1984    Years since quitting: 37.0   Smokeless tobacco: Never  Vaping Use   Vaping Use: Never used  Substance and Sexual Activity   Alcohol use: Yes    Alcohol/week: 24.0 standard drinks    Types: 24 Glasses of wine per week   Drug use: No   Sexual activity: Not Currently    Partners: Male  Other Topics Concern   Not on file  Social History Narrative   The patient works as a Marine scientist in the electrophysiology department. Her daughter and 62-monthold granddaughter live with her. She does not smoke cigarettes.   Social Determinants of Health   Financial Resource Strain: Not on file  Food Insecurity: Not on file  Transportation Needs: Not on file  Physical Activity: Not on file  Stress: Not on file  Social Connections: Not on file   Allergies  Allergen Reactions   Hydrocodone-Acetaminophen Itching   Lipitor [Atorvastatin Calcium] Other (See Comments)    Elevated liver enzymes   Morphine And Related Nausea Only   Tetracyclines & Related Rash    Break out on all extremties   Family History  Problem Relation Age of Onset   Hypertension Mother    Osteopenia Mother    Hypertension Father    Prostate cancer Father    Osteopenia Maternal Grandmother      Current Outpatient Medications (Cardiovascular):    Alirocumab (PRALUENT) 150 MG/ML SOAJ, Inject 150 mg into the skin every 14 (fourteen) days.   indapamide (LOZOL) 1.25 MG tablet, Take 1 tablet (1.25 mg  total) by mouth daily.   nebivolol (BYSTOLIC) 5 MG tablet, Take 1 tablet (5 mg total) by mouth daily.   olmesartan (BENICAR) 20 MG tablet, Take 1 tablet (20 mg total) by mouth daily.   Pitavastatin Calcium (LIVALO) 1 MG TABS, Take 1 tablet (1 mg total) by mouth daily.   Current Outpatient Medications (Analgesics):    celecoxib (CELEBREX) 200 MG capsule, Take 200 mg by mouth daily as needed.   traMADol (ULTRAM) 50 MG tablet,    Current Outpatient Medications (Other):    Calcium Citrate-Vitamin D 315-250 MG-UNIT TABS, Take 1 tablet by mouth 2 (two) times daily.   cycloSPORINE (RESTASIS) 0.05 % ophthalmic emulsion, Instill 1 drop into both eyes twice a day   Lifitegrast (XIIDRA) 5 % SOLN, Place 1 drop into both eyes 2 (two) times daily.   MULTIPLE VITAMINS-MINERALS PO, Take  1 tablet by mouth daily.   Omega-3 Fatty Acids (FISH OIL) 1000 MG CAPS, Take 1,000 mg by mouth daily.   pantoprazole (PROTONIX) 40 MG tablet, TAKE 1 TABLET (40 MG TOTAL) BY MOUTH DAILY.   Turmeric 500 MG CAPS, Take 500 mg by mouth 2 (two) times daily.   valACYclovir (VALTREX) 1000 MG tablet, TAKE 1/2 TABLET BY MOUTH DAILY FOR SUPPRESSION, TAKE 2 TABLETS BY MOUTH EVERY MORNING AND EVENING FOR 1 DAY AS NEEDED   Reviewed prior external information including notes and imaging from  primary care provider As well as notes that were available from care everywhere and other healthcare systems.  This also includes patient's MRI of the left hip from 2020 was fairly normal.  Patient's most recent x-rays of the hip taken in January of last year did show some mild progression with mild arthritic changes.  Patient's last injection in the back of the left L2-L3 was done in January 2022.  Past medical history, social, surgical and family history all reviewed in electronic medical record.  No pertanent information unless stated regarding to the chief complaint.   Review of Systems:  No headache, visual changes, nausea, vomiting,  diarrhea, constipation, dizziness, abdominal pain, skin rash, fevers, chills, night sweats, weight loss, swollen lymph nodes,  joint swelling, chest pain, shortness of breath, mood changes. POSITIVE muscle aches, body aches  Objective  Blood pressure 100/72, pulse (!) 59, height 5' 9"  (1.753 m), weight 169 lb (76.7 kg), SpO2 99 %.   General: No apparent distress alert and oriented x3 mood and affect normal, dressed appropriately.  HEENT: Pupils equal, extraocular movements intact  Respiratory: Patient's speak in full sentences and does not appear short of breath  Cardiovascular: No lower extremity edema, non tender, no erythema  Gait patient does have some limited range of motion of the left hip noted. Patient is minorly tender over the tensor fascia lata.  Tightness noted of the hip flexor.  Patient does have tightness with straight leg test.  Back exam does have loss of lordosis.   Impression and Recommendations:     The above documentation has been reviewed and is accurate and complete Lyndal Pulley, DO

## 2021-12-07 ENCOUNTER — Other Ambulatory Visit: Payer: Self-pay

## 2021-12-07 ENCOUNTER — Other Ambulatory Visit (HOSPITAL_COMMUNITY): Payer: Self-pay

## 2021-12-07 ENCOUNTER — Ambulatory Visit: Payer: Self-pay

## 2021-12-07 ENCOUNTER — Ambulatory Visit: Payer: 59 | Admitting: Family Medicine

## 2021-12-07 ENCOUNTER — Other Ambulatory Visit: Payer: Self-pay | Admitting: Family Medicine

## 2021-12-07 ENCOUNTER — Encounter: Payer: Self-pay | Admitting: Family Medicine

## 2021-12-07 VITALS — BP 100/72 | HR 59 | Ht 69.0 in | Wt 169.0 lb

## 2021-12-07 DIAGNOSIS — M25552 Pain in left hip: Secondary | ICD-10-CM

## 2021-12-07 DIAGNOSIS — M4807 Spinal stenosis, lumbosacral region: Secondary | ICD-10-CM

## 2021-12-07 MED ORDER — PANTOPRAZOLE SODIUM 40 MG PO TBEC
40.0000 mg | DELAYED_RELEASE_TABLET | Freq: Every day | ORAL | 0 refills | Status: DC
  Filled 2021-12-07: qty 90, 90d supply, fill #0

## 2021-12-07 NOTE — Patient Instructions (Addendum)
Letter with no restrictions I will document  PT [address] See me again in 6 weeks if hip hasn't improved

## 2021-12-07 NOTE — Assessment & Plan Note (Addendum)
Patient has had known spinal stenosis and has responded fairly well to injections, physical therapy and medications.  Patient has also been able to continue to work as long as she works 2 shifts in a row and then a day off.  Patient has had these restrictions for nearly 2 years at this time.  Discussed with patient that I do still feel that this could be more beneficial and would allow her to extend her work life.  Patient though is requesting this to be lifted for the restrictions otherwise she has been told that there is a possibility that there would make her move to another department or potentially even lose employment.  We discussed with patient about the pain she is having in the left leg and we discussed the possibility of injection in the back.  Patient will start with physical therapy and we will see how patient responds.  Follow-up again with me in 2 to 3 months if needed.  Total time discussing patient's letter for appointment, long-term ramifications, medications and other treatment options greater than 40 minutes.  This includes reviewing patient's previous imaging.

## 2021-12-27 ENCOUNTER — Other Ambulatory Visit (HOSPITAL_COMMUNITY): Payer: Self-pay

## 2021-12-28 ENCOUNTER — Other Ambulatory Visit (HOSPITAL_COMMUNITY): Payer: Self-pay

## 2021-12-29 ENCOUNTER — Other Ambulatory Visit (HOSPITAL_COMMUNITY): Payer: Self-pay

## 2022-01-20 ENCOUNTER — Ambulatory Visit: Payer: 59 | Admitting: Family Medicine

## 2022-01-26 ENCOUNTER — Other Ambulatory Visit: Payer: Self-pay | Admitting: Internal Medicine

## 2022-01-26 DIAGNOSIS — R931 Abnormal findings on diagnostic imaging of heart and coronary circulation: Secondary | ICD-10-CM

## 2022-01-26 DIAGNOSIS — E782 Mixed hyperlipidemia: Secondary | ICD-10-CM

## 2022-01-28 ENCOUNTER — Other Ambulatory Visit (HOSPITAL_COMMUNITY): Payer: Self-pay

## 2022-01-28 MED ORDER — PRALUENT 150 MG/ML ~~LOC~~ SOAJ
150.0000 mg | SUBCUTANEOUS | 3 refills | Status: DC
  Filled 2022-01-28: qty 6, 84d supply, fill #0
  Filled 2022-04-18: qty 6, 84d supply, fill #1
  Filled 2022-07-06: qty 6, 84d supply, fill #2
  Filled 2022-09-14: qty 6, 84d supply, fill #3

## 2022-02-06 ENCOUNTER — Other Ambulatory Visit: Payer: Self-pay | Admitting: Internal Medicine

## 2022-02-06 DIAGNOSIS — I10 Essential (primary) hypertension: Secondary | ICD-10-CM

## 2022-02-06 MED ORDER — INDAPAMIDE 1.25 MG PO TABS
1.2500 mg | ORAL_TABLET | Freq: Every day | ORAL | 0 refills | Status: DC
Start: 2022-02-06 — End: 2022-05-01
  Filled 2022-02-06: qty 90, 90d supply, fill #0

## 2022-02-06 MED ORDER — OLMESARTAN MEDOXOMIL 20 MG PO TABS
20.0000 mg | ORAL_TABLET | Freq: Every day | ORAL | 0 refills | Status: DC
  Filled 2022-02-06: qty 90, 90d supply, fill #0

## 2022-02-06 MED ORDER — NEBIVOLOL HCL 5 MG PO TABS
5.0000 mg | ORAL_TABLET | Freq: Every day | ORAL | 0 refills | Status: DC
  Filled 2022-02-06: qty 90, 90d supply, fill #0

## 2022-02-07 ENCOUNTER — Other Ambulatory Visit (HOSPITAL_COMMUNITY): Payer: Self-pay

## 2022-02-10 ENCOUNTER — Ambulatory Visit: Payer: 59 | Admitting: Internal Medicine

## 2022-02-10 ENCOUNTER — Encounter: Payer: Self-pay | Admitting: Internal Medicine

## 2022-02-10 VITALS — BP 136/78 | HR 62 | Ht 69.0 in | Wt 170.0 lb

## 2022-02-10 DIAGNOSIS — T466X5A Adverse effect of antihyperlipidemic and antiarteriosclerotic drugs, initial encounter: Secondary | ICD-10-CM

## 2022-02-10 DIAGNOSIS — K760 Fatty (change of) liver, not elsewhere classified: Secondary | ICD-10-CM | POA: Diagnosis not present

## 2022-02-10 DIAGNOSIS — E782 Mixed hyperlipidemia: Secondary | ICD-10-CM

## 2022-02-10 DIAGNOSIS — T466X5D Adverse effect of antihyperlipidemic and antiarteriosclerotic drugs, subsequent encounter: Secondary | ICD-10-CM | POA: Diagnosis not present

## 2022-02-10 DIAGNOSIS — M791 Myalgia, unspecified site: Secondary | ICD-10-CM

## 2022-02-10 DIAGNOSIS — R931 Abnormal findings on diagnostic imaging of heart and coronary circulation: Secondary | ICD-10-CM

## 2022-02-10 NOTE — Patient Instructions (Signed)
Medication Instructions:  ?Your physician recommends that you continue on your current medications as directed. Please refer to the Current Medication list given to you today. ? ?*If you need a refill on your cardiac medications before your next appointment, please call your pharmacy* ? ? ?Lab Work: ?Lipid Panel, Liver Function Panel  ? ?FASTING labs in 1 year (before next visit) ? ?If you have labs (blood work) drawn today and your tests are completely normal, you will receive your results only by: ?MyChart Message (if you have MyChart) OR ?A paper copy in the mail ?If you have any lab test that is abnormal or we need to change your treatment, we will call you to review the results. ? ? ?Testing/Procedures: ?NONE ? ? ?Follow-Up: ?At [facility], you and your health needs are our priority.  As part of our continuing mission to provide you with exceptional heart care, we have created designated Provider Care Teams.  These Care Teams include your primary Cardiologist (physician) and Advanced Practice Providers (APPs -  Physician Assistants and Nurse Practitioners) who all work together to provide you with the care you need, when you need it. ? ?We recommend signing up for the patient portal called "MyChart".  Sign up information is provided on this After Visit Summary.  MyChart is used to connect with patients for Virtual Visits (Telemedicine).  Patients are able to view lab/test results, encounter notes, upcoming appointments, etc.  Non-urgent messages can be sent to your provider as well.   ?To learn more about what you can do with MyChart, go to [email or web address].   ? ?Your next appointment:   ?12 month(s) ? ?The format for your next appointment:   ?In Person ? ?Provider:   ?Lyman Bishop MD -- lipid clinic ? ?

## 2022-02-10 NOTE — Progress Notes (Signed)
? ?OFFICE NOTE ? ?Chief Complaint:  ?Follow-up dyslipidemia ? ?Primary Care Physician: ?Janith Lima, MD ? ?HPI:  ?[name] [name] is a 61 y.o. female with a past medial history significant for cerebral aneurysm, hypertension, dyslipidemia and family history of hypertension and dyslipidemia, but no significant early onset heart disease.  She was noted to have dyslipidemia in the past and this was monitored.  When she changed primary care providers she was started on statin therapy which caused a marked reduction in cholesterol.  The time this was atorvastatin 10 mg, however she had significant elevation of liver enzymes.  An abdominal ultrasound in 2018 demonstrated fatty liver disease is the likely cause of this.  After stopping the statin therapy her liver enzymes returned to baseline which was borderline abnormal.  She was seen by Tana Coast, PharmD, for evaluation of her dyslipidemia as a new patient. Georgina Peer recommended trying Livalo 1 mg daily.  [name] [name] reports compliance with this medication and had liver enzyme testing 2 months ago which indicated stable liver enzymes.  I suspect she is tolerating this well as the statin is predominantly glucuronidated.  However, it is a low to moderate intensity statin at various doses and may not reduce her cholesterol significantly.  We further discussed her diet today which she felt was very healthy.  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  She reports a very deep knowledge of healthy diet.  There is no documented ASCVD. ? ?05/01/2018 ? ?[name] [name] returns today for follow-up.  She has aggressively tried to alter her diet as well as maintain compliance with Livalo 1 mg daily.  We repeated recent lab work which indicates reduction in cholesterol the 271, triglycerides were 154 (reduced from 290), HDL increased from 45-73 and calculated LDL  was 167 (up from 146).  This does represent an increase LDL, however she has had particle shifting.  Liver enzymes have remained stable he mild elevated less than 2 times the upper limit of normal.  She underwent coronary artery calcium scoring, which demonstrated a calcium score of 6.  There was some scattered sub-pulmonary nodules and I would recommend a repeat CT scan without contrast in 1 year. ? ?07/31/2018 ? ?[name] [name] is seen today in follow-up.  Overall she seems to be doing well.  I increased her Livalo to 2 mg daily.  This was associated with an increase in liver enzymes which was significant.  Her AST went from 21-67 and ALT from 26-138.  Again, she remains asymptomatic with this, however I have some concern given her history of underlying liver disease whether she will be tolerating this increase.  It is seems that she will not.  ? ?12/19/2018 ? ?[name] [name] returns today for follow-up.  Overall she has been doing well and tolerating Praluent.  We had to reduce the dose of her Livalo due to elevated liver enzymes.  Her most recent liver enzymes however have normalized with AST of 33 and ALT of 23.  Her cholesterol has also dropped significantly on Praluent.  Her total cholesterol is 132, triglycerides 173 (decreased from 220), HDL 57 and LDL 40 (decreased from 167).  She is tolerating the medication without any myalgias or other side effects. ? ?12/24/2018 ? ?[name] [name] is seen today for annual follow-up.  She continues to do fairly well.  She struggles with low back pain  and some issues related to that.  She has been less physically active due to that.  Over the summer her cholesterol had gone up.  Her total was 199, triglycerides 117, HDL 55 and LDL 91 (increased from 40).  She reports compliance with her Praluent and Livalo.  She is due for refills.  She has had repeat prior authorization for the medication.  ? ?01/20/2021 ? ?[name] [name] returns today for follow-up.  She continues to do well on Praluent in  addition to low-dose Livalo.  Her most recent lipid profile showed total cholesterol 139, HDL 71, triglycerides 86 and LDL 52.  She did have mild elevation ALT to 40 and in October 2021.  In the past she has had more elevated liver enzymes however for the past 2 years they have been generally normal.  She does have underlying hepatic steatosis. ? ?02/10/2022 ? ?[name] [name] returns today for follow-up.  She said continues to do well on Praluent and Livalo.  She has not had repeat lipids but is fasting today.  She would like to have them assessed.  She denies any chest pain or worsening shortness of breath.  She is physically active.  She continues to work in the [place] at [employer] ? ?PMHx:  ?Past Medical History:  ?Diagnosis Date  ? Bleeds easily ([place])   ? Cerebral aneurysm 1999  ? Surgically coiled  ? DDD (degenerative disc disease)   ? GERD (gastroesophageal reflux disease)   ? History of Barrett's esophagus   ? Hypercholesterolemia   ? Hypertension   ? Irritable bowel disease   ? Sinus disease   ? ? ?Past Surgical History:  ?Procedure Laterality Date  ? ABDOMINAL HYSTERECTOMY  2005  ? ANAL RECTAL MANOMETRY N/A 09/14/2020  ? Procedure: ANO RECTAL MANOMETRY;  Surgeon: Ileana Roup, MD;  Location: [facility];  Service: General;  Laterality: N/A;  ? APPENDECTOMY    ? ARTHROTOMY Right 12/11/2015  ? Procedure: OPEN ARTHROTOMY ;  Surgeon: Roseanne Kaufman, MD;  Location: [facility];  Service: Orthopedics;  Laterality: Right;  ? AUGMENTATION MAMMAPLASTY Bilateral   ? BREAST ENHANCEMENT SURGERY    ? CEREBRAL ANEURYSM REPAIR  1999  ? 8 platinum coils  ? DIAGNOSTIC LAPAROSCOPY    ? KNEE ARTHROSCOPY Left 08/03/2021  ? Procedure: LEFT KNEE ARTHROSCOPY, PARTIAL MEDIAL MENISCECTOMY AND CONDROPLASTY;  Surgeon: Melrose Nakayama, MD;  Location: [facility];  Service: Orthopedics;  Laterality: Left;  ? LUMBAR DISC SURGERY Left 06/03/2010  ? Procedure: ANTEROLATERAL DECOMPRESSION AND ARTHRODESIS L3-4, L4-5  ? NASAL SINUS  SURGERY  2013  ? ORIF ANKLE FRACTURE  03/01/2012  ? Procedure: OPEN REDUCTION INTERNAL FIXATION (ORIF) ANKLE FRACTURE;  Surgeon: Wylene Simmer, MD;  Location: [facility];  Service: Orthopedics;  Laterality: Right;  Open reduction internal fixation right navicular fracture  ? REFRACTIVE SURGERY    ? SHOULDER ARTHROSCOPY Left   ? SYNOVECTOMY Right 12/11/2015  ? Procedure: SYNOVECTOMY DISTAL RADIOULNAR JOINT LOOSE BODY REMOVAL ;  Surgeon: Roseanne Kaufman, MD;  Location: [facility];  Service: Orthopedics;  Laterality: Right;  ? WRIST ARTHROSCOPY Right 12/11/2015  ? Procedure: RIGHT WRIST ARTHROSCOPY WITH DEBRIDEMENT ;  Surgeon: Roseanne Kaufman, MD;  Location: [place];  Service: Orthopedics;  Laterality: Right;  ? ? ?FAMHx:  ?Family History  ?Problem Relation Age of Onset  ? Hypertension Mother   ? Osteopenia Mother   ? Hypertension Father   ? Prostate cancer Father   ? Osteopenia Maternal  Grandmother   ? ? ?SOCHx:  ? reports that she quit smoking about 37 years ago. Her smoking use included cigarettes. She has never used smokeless tobacco. She reports current alcohol use of about 24.0 standard drinks per week. She reports that she does not use drugs. ? ?ALLERGIES:  ?Allergies  ?Allergen Reactions  ? Hydrocodone-Acetaminophen Itching  ? Lipitor [Atorvastatin Calcium] Other (See Comments)  ?  Elevated liver enzymes  ? Morphine And Related Nausea Only  ? Tetracyclines & Related Rash  ?  Break out on all extremties  ? ? ?ROS: ?Pertinent items noted in HPI and remainder of comprehensive ROS otherwise negative. ? ?HOME MEDS: ?Current Outpatient Medications on File Prior to Visit  ?Medication Sig Dispense Refill  ? Alirocumab (PRALUENT) 150 MG/ML SOAJ Inject 150 mg into the skin every 14 (fourteen) days. 6 mL 3  ? Calcium Citrate-Vitamin D 315-250 MG-UNIT TABS Take 1 tablet by mouth 2 (two) times daily.    ? indapamide (LOZOL) 1.25 MG tablet Take 1 tablet (1.25 mg total) by mouth  daily. 90 tablet 0  ? MULTIPLE VITAMINS-MINERALS PO Take 1 tablet by mouth daily.    ? nebivolol (BYSTOLIC) 5 MG tablet Take 1 tablet (5 mg total) by mouth daily. 90 tablet 0  ? olmesartan (BENICAR) 20 MG ta

## 2022-02-11 LAB — LIPID PANEL
Chol/HDL Ratio: 2 ratio (ref 0.0–4.4)
Cholesterol, Total: 131 mg/dL (ref 100–199)
HDL: 65 mg/dL (ref >39)
LDL Chol Calc (NIH): 39 mg/dL (ref 0–99)
Triglycerides: 169 mg/dL — ABNORMAL HIGH (ref 0–149)
VLDL Cholesterol Cal: 27 mg/dL (ref 5–40)

## 2022-02-16 ENCOUNTER — Encounter: Payer: Self-pay | Admitting: Internal Medicine

## 2022-02-16 ENCOUNTER — Ambulatory Visit: Payer: 59 | Admitting: Internal Medicine

## 2022-02-16 VITALS — BP 124/80 | HR 67 | Temp 98.5°F | Resp 16 | Ht 69.0 in | Wt 169.0 lb

## 2022-02-16 DIAGNOSIS — K760 Fatty (change of) liver, not elsewhere classified: Secondary | ICD-10-CM | POA: Diagnosis not present

## 2022-02-16 DIAGNOSIS — N182 Chronic kidney disease, stage 2 (mild): Secondary | ICD-10-CM | POA: Diagnosis not present

## 2022-02-16 DIAGNOSIS — I10 Essential (primary) hypertension: Secondary | ICD-10-CM | POA: Diagnosis not present

## 2022-02-16 LAB — BASIC METABOLIC PANEL
BUN: 26 mg/dL — ABNORMAL HIGH (ref 6–23)
CO2: 30 mEq/L (ref 19–32)
Calcium: 10.3 mg/dL (ref 8.4–10.5)
Chloride: 101 mEq/L (ref 96–112)
Creatinine, Ser: 1.03 mg/dL (ref 0.40–1.20)
GFR: 58.21 mL/min — ABNORMAL LOW (ref >60.00)
Glucose, Bld: 102 mg/dL — ABNORMAL HIGH (ref 70–99)
Potassium: 4.4 mEq/L (ref 3.5–5.1)
Sodium: 139 mEq/L (ref 135–145)

## 2022-02-16 LAB — HEPATIC FUNCTION PANEL
ALT: 22 U/L (ref 0–35)
AST: 30 U/L (ref 0–37)
Albumin: 4.8 g/dL (ref 3.5–5.2)
Alkaline Phosphatase: 57 U/L (ref 39–117)
Bilirubin, Direct: 0.1 mg/dL (ref 0.0–0.3)
Total Bilirubin: 0.5 mg/dL (ref 0.2–1.2)
Total Protein: 7.3 g/dL (ref 6.0–8.3)

## 2022-02-16 NOTE — Progress Notes (Signed)
? ?Subjective:  ?Patient ID: [name] [name], female    DOB: 1960/11/20  Age: 61 y.o. MRN: [id number] ? ?CC: Hypertension ? ? ?HPI ?[name] [name] presents for f/up - ? ?She tells me her blood pressure has been well controlled and she is active and denies chest pain, shortness of breath, diaphoresis, or edema. ?. ?Outpatient Medications Prior to Visit  ?Medication Sig Dispense Refill  ? Alirocumab (PRALUENT) 150 MG/ML SOAJ Inject 150 mg into the skin every 14 (fourteen) days. 6 mL 3  ? Calcium Citrate-Vitamin D 315-250 MG-UNIT TABS Take 1 tablet by mouth 2 (two) times daily.    ? indapamide (LOZOL) 1.25 MG tablet Take 1 tablet (1.25 mg total) by mouth daily. 90 tablet 0  ? MULTIPLE VITAMINS-MINERALS PO Take 1 tablet by mouth daily.    ? nebivolol (BYSTOLIC) 5 MG tablet Take 1 tablet (5 mg total) by mouth daily. 90 tablet 0  ? olmesartan (BENICAR) 20 MG tablet Take 1 tablet (20 mg total) by mouth daily. 90 tablet 0  ? Omega-3 Fatty Acids (FISH OIL) 1000 MG CAPS Take 1,000 mg by mouth daily.    ? pantoprazole (PROTONIX) 40 MG tablet Take 1 tablet (40 mg total) by mouth daily. 90 tablet 0  ? Pitavastatin Calcium (LIVALO) 1 MG TABS Take 1 tablet (1 mg total) by mouth daily. 90 tablet 3  ? Turmeric 500 MG CAPS Take 500 mg by mouth 2 (two) times daily.    ? ?No facility-administered medications prior to visit.  ? ? ?ROS ?Review of Systems  ?Constitutional:  Negative for chills, diaphoresis, fatigue and fever.  ?HENT: Negative.  Negative for trouble swallowing.   ?Respiratory: Negative.    ?Cardiovascular:  Negative for chest pain, palpitations and leg swelling.  ?Gastrointestinal:  Negative for abdominal pain, constipation, diarrhea, nausea and vomiting.  ?Endocrine: Negative.   ?Genitourinary: Negative.  Negative for difficulty urinating and dyspareunia.  ?Musculoskeletal:  Positive for arthralgias. Negative for myalgias.  ?Skin: Negative.   ?Neurological: Negative.  Negative for dizziness and weakness.  ?Hematological:   Negative for adenopathy. Does not bruise/bleed easily.  ?Psychiatric/Behavioral: Negative.    ? ?Objective:  ?BP 124/80 (BP Location: Right Arm, Patient Position: Sitting, Cuff Size: Normal)   Pulse 67   Temp 98.5 ?F (36.9 ?C) (Oral)   Resp 16   Ht 5' 9"  (1.753 m)   Wt 169 lb (76.7 kg)   SpO2 94%   BMI 24.96 kg/m?  ? ?BP Readings from Last 3 Encounters:  ?02/16/22 124/80  ?02/10/22 136/78  ?12/07/21 100/72  ? ? ?Wt Readings from Last 3 Encounters:  ?02/16/22 169 lb (76.7 kg)  ?02/10/22 170 lb (77.1 kg)  ?12/07/21 169 lb (76.7 kg)  ? ? ?Physical Exam ?Vitals reviewed.  ?HENT:  ?   Nose: Nose normal.  ?   Mouth/Throat:  ?   Mouth: Mucous membranes are moist.  ?Eyes:  ?   General: No scleral icterus. ?   Conjunctiva/sclera: Conjunctivae normal.  ?Cardiovascular:  ?   Rate and Rhythm: Normal rate and regular rhythm.  ?   Heart sounds: No murmur heard. ?Pulmonary:  ?   Effort: Pulmonary effort is normal.  ?   Breath sounds: No stridor. No wheezing, rhonchi or rales.  ?Abdominal:  ?   General: Abdomen is flat. There is no distension.  ?   Palpations: There is no mass.  ?   Tenderness: There is no abdominal tenderness. There is no guarding.  ?   Hernia: No  hernia is present.  ?Musculoskeletal:     ?   General: Normal range of motion.  ?   Cervical back: Neck supple.  ?   Right lower leg: No edema.  ?   Left lower leg: No edema.  ?Lymphadenopathy:  ?   Cervical: No cervical adenopathy.  ?Skin: ?   General: Skin is warm.  ?Neurological:  ?   General: No focal deficit present.  ?   Mental Status: She is alert.  ?Psychiatric:     ?   Mood and Affect: Mood normal.     ?   Behavior: Behavior normal.  ? ? ?Lab Results  ?Component Value Date  ? WBC 6.1 11/18/2021  ? HGB 12.7 11/18/2021  ? HCT 37.7 11/18/2021  ? PLT 259.0 11/18/2021  ? GLUCOSE 102 (H) 02/16/2022  ? CHOL 131 02/10/2022  ? TRIG 169 (H) 02/10/2022  ? HDL 65 02/10/2022  ? LDLDIRECT 146.0 11/21/2017  ? LDLCALC 39 02/10/2022  ? ALT 22 02/16/2022  ? AST 30  02/16/2022  ? NA 139 02/16/2022  ? K 4.4 02/16/2022  ? CL 101 02/16/2022  ? CREATININE 1.03 02/16/2022  ? BUN 26 (H) 02/16/2022  ? CO2 30 02/16/2022  ? TSH 2.42 11/18/2021  ? INR 1.1 (H) 11/18/2021  ? HGBA1C 5.1 12/18/2017  ? MICROALBUR 2.4 (H) 05/27/2020  ? ? ?MM 3D SCREEN BREAST W/IMPLANT BILATERAL ? ?Result Date: 11/24/2021 ?CLINICAL DATA:  Screening. EXAM: DIGITAL SCREENING BILATERAL MAMMOGRAM WITH IMPLANTS, CAD AND TOMOSYNTHESIS TECHNIQUE: Bilateral screening digital craniocaudal and mediolateral oblique mammograms were obtained. Bilateral screening digital breast tomosynthesis was performed. The images were evaluated with computer-aided detection. Standard and/or implant displaced views were performed. COMPARISON:  Previous exam(s). ACR Breast Density Category b: There are scattered areas of fibroglandular density. FINDINGS: The patient has retroglandular implants. There are no findings suspicious for malignancy. IMPRESSION: No mammographic evidence of malignancy. A result letter of this screening mammogram will be mailed directly to the patient. RECOMMENDATION: Screening mammogram in one year. (Code:SM-B-01Y) BI-RADS CATEGORY  1:  Negative. Electronically Signed   By: Marin Olp M.D.   On: 11/24/2021 12:16  ? ? ?Assessment & Plan:  ? ?[name] was seen today for hypertension. ? ?Diagnoses and all orders for this visit: ? ?NAFLD (nonalcoholic fatty liver disease)- Her LFTs have improved and she is immune to hep A and B. ?-     Hepatic function panel; Future ?-     Hepatitis B surface antibody,quantitative; Future ?-     Hepatitis B surface antigen; Future ?-     Hepatitis A antibody, total; Future ?-     Hepatitis B core antibody, total; Future ?-     Hepatitis B core antibody, total ?-     Hepatitis A antibody, total ?-     Hepatitis B surface antigen ?-     Hepatitis B surface antibody,quantitative ?-     Hepatic function panel ? ?Primary hypertension- Her blood pressure is adequately well controlled. ?-      Basic metabolic panel; Future ?-     Hepatic function panel; Future ?-     Hepatic function panel ?-     Basic metabolic panel ? ?Chronic renal disease, stage 2, mildly decreased glomerular filtration rate (GFR) between 60-89 mL/min/1.73 square meter- Will maintain control of her blood pressure and I have asked her to avoid NSAIDs. ? ? ?I am having Tuyet S. Akard maintain her Fish Oil, Calcium Citrate-Vitamin D, MULTIPLE VITAMINS-MINERALS PO, Turmeric, Livalo, pantoprazole,  Praluent, indapamide, nebivolol, and olmesartan. ? ?No orders of the defined types were placed in this encounter. ? ? ? ?Follow-up: Return in about 6 months (around 08/18/2022). ? ?Scarlette Calico, MD ?

## 2022-02-16 NOTE — Patient Instructions (Signed)

## 2022-02-17 LAB — HEPATITIS B SURFACE ANTIBODY, QUANTITATIVE: Hep B S AB Quant (Post): 368 m[IU]/mL (ref >=10)

## 2022-02-17 LAB — HEPATITIS B CORE ANTIBODY, TOTAL: Hep B Core Total Ab: NONREACTIVE

## 2022-02-17 LAB — HEPATITIS A ANTIBODY, TOTAL: Hepatitis A AB,Total: REACTIVE — AB

## 2022-02-17 LAB — HEPATITIS B SURFACE ANTIGEN: Hepatitis B Surface Ag: NONREACTIVE

## 2022-03-06 ENCOUNTER — Other Ambulatory Visit: Payer: Self-pay | Admitting: Internal Medicine

## 2022-03-07 ENCOUNTER — Other Ambulatory Visit (HOSPITAL_COMMUNITY): Payer: Self-pay

## 2022-03-07 MED ORDER — LIVALO 1 MG PO TABS
1.0000 | ORAL_TABLET | Freq: Every day | ORAL | 3 refills | Status: DC
  Filled 2022-03-07: qty 90, 90d supply, fill #0
  Filled 2022-06-01: qty 90, 90d supply, fill #1
  Filled 2022-07-12 – 2022-09-14 (×3): qty 90, 90d supply, fill #2

## 2022-03-08 ENCOUNTER — Other Ambulatory Visit: Payer: Self-pay | Admitting: Family Medicine

## 2022-03-09 ENCOUNTER — Other Ambulatory Visit (HOSPITAL_COMMUNITY): Payer: Self-pay

## 2022-03-09 ENCOUNTER — Other Ambulatory Visit: Payer: Self-pay | Admitting: Internal Medicine

## 2022-03-09 MED ORDER — PANTOPRAZOLE SODIUM 40 MG PO TBEC
40.0000 mg | DELAYED_RELEASE_TABLET | Freq: Every day | ORAL | 1 refills | Status: DC
  Filled 2022-03-09: qty 90, 90d supply, fill #0
  Filled 2022-06-01: qty 90, 90d supply, fill #1

## 2022-03-09 MED ORDER — EYSUVIS 0.25 % OP SUSP
1.0000 [drp] | Freq: Four times a day (QID) | OPHTHALMIC | 0 refills | Status: DC
  Filled 2022-03-09: qty 8.3, 20d supply, fill #0
  Filled 2022-03-10: qty 8.3, 21d supply, fill #0
  Filled 2022-03-23: qty 8.3, 14d supply, fill #0

## 2022-03-10 ENCOUNTER — Other Ambulatory Visit (HOSPITAL_COMMUNITY): Payer: Self-pay

## 2022-03-23 ENCOUNTER — Other Ambulatory Visit (HOSPITAL_COMMUNITY): Payer: Self-pay

## 2022-04-08 DIAGNOSIS — M25562 Pain in left knee: Secondary | ICD-10-CM | POA: Diagnosis not present

## 2022-04-18 ENCOUNTER — Other Ambulatory Visit (HOSPITAL_COMMUNITY): Payer: Self-pay

## 2022-05-01 ENCOUNTER — Other Ambulatory Visit: Payer: Self-pay | Admitting: Internal Medicine

## 2022-05-01 DIAGNOSIS — I10 Essential (primary) hypertension: Secondary | ICD-10-CM

## 2022-05-01 MED ORDER — NEBIVOLOL HCL 5 MG PO TABS
5.0000 mg | ORAL_TABLET | Freq: Every day | ORAL | 0 refills | Status: DC
Start: 2022-05-01 — End: 2022-07-12
  Filled 2022-05-01: qty 16, 16d supply, fill #0
  Filled 2022-05-02: qty 74, 74d supply, fill #0

## 2022-05-01 MED ORDER — OLMESARTAN MEDOXOMIL 20 MG PO TABS
20.0000 mg | ORAL_TABLET | Freq: Every day | ORAL | 0 refills | Status: DC
  Filled 2022-05-01: qty 90, 90d supply, fill #0

## 2022-05-01 MED ORDER — INDAPAMIDE 1.25 MG PO TABS
1.2500 mg | ORAL_TABLET | Freq: Every day | ORAL | 0 refills | Status: DC
  Filled 2022-05-01: qty 90, 90d supply, fill #0

## 2022-05-02 ENCOUNTER — Other Ambulatory Visit (HOSPITAL_COMMUNITY): Payer: Self-pay

## 2022-06-01 ENCOUNTER — Other Ambulatory Visit (HOSPITAL_COMMUNITY): Payer: Self-pay

## 2022-06-01 DIAGNOSIS — Z01419 Encounter for gynecological examination (general) (routine) without abnormal findings: Secondary | ICD-10-CM | POA: Diagnosis not present

## 2022-06-01 DIAGNOSIS — Z90710 Acquired absence of both cervix and uterus: Secondary | ICD-10-CM | POA: Diagnosis not present

## 2022-06-01 DIAGNOSIS — B009 Herpesviral infection, unspecified: Secondary | ICD-10-CM | POA: Diagnosis not present

## 2022-06-01 DIAGNOSIS — N9089 Other specified noninflammatory disorders of vulva and perineum: Secondary | ICD-10-CM | POA: Diagnosis not present

## 2022-06-01 MED ORDER — VALACYCLOVIR HCL 1 G PO TABS
ORAL_TABLET | ORAL | 4 refills | Status: DC
  Filled 2022-06-01: qty 45, 75d supply, fill #0
  Filled 2022-08-31 – 2022-09-14 (×2): qty 45, 75d supply, fill #1
  Filled 2022-12-12: qty 45, 75d supply, fill #2
  Filled 2023-02-20: qty 45, 75d supply, fill #3
  Filled 2023-05-08: qty 45, 75d supply, fill #4

## 2022-06-02 ENCOUNTER — Other Ambulatory Visit (HOSPITAL_COMMUNITY): Payer: Self-pay

## 2022-07-06 ENCOUNTER — Other Ambulatory Visit (HOSPITAL_COMMUNITY): Payer: Self-pay

## 2022-07-11 DIAGNOSIS — M25562 Pain in left knee: Secondary | ICD-10-CM | POA: Diagnosis not present

## 2022-07-11 DIAGNOSIS — M1712 Unilateral primary osteoarthritis, left knee: Secondary | ICD-10-CM | POA: Diagnosis not present

## 2022-07-12 ENCOUNTER — Other Ambulatory Visit (HOSPITAL_COMMUNITY): Payer: Self-pay

## 2022-07-12 ENCOUNTER — Other Ambulatory Visit: Payer: Self-pay | Admitting: Internal Medicine

## 2022-07-12 DIAGNOSIS — I10 Essential (primary) hypertension: Secondary | ICD-10-CM

## 2022-07-12 MED ORDER — OLMESARTAN MEDOXOMIL 20 MG PO TABS
20.0000 mg | ORAL_TABLET | Freq: Every day | ORAL | 0 refills | Status: DC
  Filled 2022-07-12 – 2022-08-12 (×2): qty 90, 90d supply, fill #0

## 2022-07-12 MED ORDER — NEBIVOLOL HCL 5 MG PO TABS
5.0000 mg | ORAL_TABLET | Freq: Every day | ORAL | 0 refills | Status: DC
  Filled 2022-07-12 – 2022-08-16 (×3): qty 90, 90d supply, fill #0

## 2022-07-12 MED ORDER — INDAPAMIDE 1.25 MG PO TABS
1.2500 mg | ORAL_TABLET | Freq: Every day | ORAL | 0 refills | Status: DC
  Filled 2022-07-12 – 2022-08-12 (×2): qty 90, 90d supply, fill #0

## 2022-07-12 MED ORDER — PANTOPRAZOLE SODIUM 40 MG PO TBEC
40.0000 mg | DELAYED_RELEASE_TABLET | Freq: Every day | ORAL | 0 refills | Status: DC
  Filled 2022-07-12 – 2022-09-14 (×3): qty 90, 90d supply, fill #0

## 2022-07-14 ENCOUNTER — Other Ambulatory Visit (HOSPITAL_COMMUNITY): Payer: Self-pay

## 2022-08-12 ENCOUNTER — Other Ambulatory Visit (HOSPITAL_COMMUNITY): Payer: Self-pay

## 2022-08-15 ENCOUNTER — Other Ambulatory Visit (HOSPITAL_COMMUNITY): Payer: Self-pay

## 2022-08-16 ENCOUNTER — Other Ambulatory Visit (HOSPITAL_COMMUNITY): Payer: Self-pay

## 2022-08-16 ENCOUNTER — Telehealth: Payer: Self-pay

## 2022-08-16 NOTE — Telephone Encounter (Signed)
Key: [id number]

## 2022-08-16 NOTE — Telephone Encounter (Signed)
Approved 08/16/2022 - 08/16/2023

## 2022-08-31 ENCOUNTER — Other Ambulatory Visit (HOSPITAL_COMMUNITY): Payer: Self-pay

## 2022-09-01 ENCOUNTER — Other Ambulatory Visit (HOSPITAL_COMMUNITY): Payer: Self-pay

## 2022-09-13 ENCOUNTER — Other Ambulatory Visit (HOSPITAL_COMMUNITY): Payer: Self-pay

## 2022-09-14 ENCOUNTER — Telehealth: Payer: Self-pay | Admitting: Internal Medicine

## 2022-09-14 ENCOUNTER — Other Ambulatory Visit (HOSPITAL_COMMUNITY): Payer: Self-pay

## 2022-09-14 NOTE — Telephone Encounter (Signed)
  Pt c/o medication issue:  1. Name of Medication:  Pitavastatin Calcium (LIVALO) 1 MG TABS Alirocumab (PRALUENT) 150 MG/ML SOAJ  2. How are you currently taking this medication (dosage and times per day)?  Take 1 tablet (1 mg total) by mouth daily. Inject 150 mg into the skin every 14 (fourteen) days.  3. Are you having a reaction (difficulty breathing--STAT)? no  4. What is your medication issue? Patient recently changed insurance, and her insurance won't cover these two medications.  It's not on their formulary.

## 2022-09-14 NOTE — Telephone Encounter (Signed)
Spoke with patient.  She switched insurance on October 1, will need new PA for both Praluent and Livalo.    Explained that we may need to switch Praluent to [place], depending on insurance preference.  She is fine with that.    [employer]   ID [id number] BIN [id number] PCN [id number]

## 2022-09-21 NOTE — Telephone Encounter (Signed)
Pt calling back for an update. She states she is out of medication

## 2022-09-22 NOTE — Telephone Encounter (Signed)
PA for pitavastatin (Livalo) sent 09/21/22  No answer yet

## 2022-09-27 NOTE — Telephone Encounter (Signed)
Brand name Chanda Busing is NOT covered with insurance per [employer] [facility] [employer] [facility]  Routed to MD to see if Zyptimag is OK thru [facility]?  Praluent is the preferred PCSK9i right now and no PA needed

## 2022-09-27 NOTE — Telephone Encounter (Signed)
MyChart message sent to patient about medications

## 2022-09-27 NOTE — Telephone Encounter (Signed)
Pixie Casino, MD  Fidel Levy, RN Caller: Unspecified (1 week ago) Ok to substitute Zyptimag from [place]

## 2022-09-30 NOTE — Telephone Encounter (Signed)
I have no way of knowing, but very rarely would a branded drug be < $35-50 even after appeal.

## 2022-10-19 NOTE — Telephone Encounter (Signed)
Per patient, she is out of Praluent and needs PA for Repatha. Her insurance changed 08/14/2022.

## 2022-10-19 NOTE — Telephone Encounter (Signed)
PA submitted. This one might require an appeals based on the questions asked

## 2022-10-25 NOTE — Telephone Encounter (Signed)
Returned call to patient who states that she is out of her Praulent. Patient reports that she is due to take another dose this weekend. Patient reports that she got a message from her insurance stating that they refused the request but patient is unsure which medication was denied if it was the praulent or the repatha. Patient would like to know where the process is on this and also if she can receive samples for the time being so she does not miss any doses. Advised patient I would forward message over to our pharmacy, patient verbalized understanding.

## 2022-10-25 NOTE — Telephone Encounter (Signed)
Her Repatha was denied. We faxed over an appeals letter today. She can get a sample of repatha from the [facility] or [facility] st office.

## 2022-10-25 NOTE — Telephone Encounter (Signed)
Patient is calling back for update. States that she she is out of medication and do for another dose this weekend. Please advise

## 2022-10-26 NOTE — Telephone Encounter (Signed)
Left repatha sample x1 for pick up. Patient notified.

## 2022-11-03 ENCOUNTER — Other Ambulatory Visit: Payer: Self-pay | Admitting: Internal Medicine

## 2022-11-03 DIAGNOSIS — I10 Essential (primary) hypertension: Secondary | ICD-10-CM

## 2022-11-08 ENCOUNTER — Other Ambulatory Visit (HOSPITAL_COMMUNITY): Payer: Self-pay

## 2022-11-13 ENCOUNTER — Telehealth: Payer: Self-pay | Admitting: Internal Medicine

## 2022-11-13 DIAGNOSIS — I10 Essential (primary) hypertension: Secondary | ICD-10-CM

## 2022-11-14 DIAGNOSIS — Z419 Encounter for procedure for purposes other than remedying health state, unspecified: Secondary | ICD-10-CM | POA: Diagnosis not present

## 2022-11-15 NOTE — Telephone Encounter (Signed)
Received letter in mail that appeals was received and would be reviewed 15 days from Dec 13. Expect letter soon with determination.

## 2022-11-17 ENCOUNTER — Other Ambulatory Visit (HOSPITAL_COMMUNITY): Payer: Self-pay

## 2022-11-17 ENCOUNTER — Other Ambulatory Visit: Payer: Self-pay | Admitting: Internal Medicine

## 2022-11-17 DIAGNOSIS — I10 Essential (primary) hypertension: Secondary | ICD-10-CM

## 2022-11-17 MED ORDER — NEBIVOLOL HCL 5 MG PO TABS
5.0000 mg | ORAL_TABLET | Freq: Every day | ORAL | 0 refills | Status: DC
  Filled 2022-11-17: qty 30, 30d supply, fill #0

## 2022-11-17 MED ORDER — INDAPAMIDE 1.25 MG PO TABS
1.2500 mg | ORAL_TABLET | Freq: Every day | ORAL | 0 refills | Status: DC
  Filled 2022-11-17: qty 30, 30d supply, fill #0

## 2022-11-17 MED ORDER — OLMESARTAN MEDOXOMIL 20 MG PO TABS
20.0000 mg | ORAL_TABLET | Freq: Every day | ORAL | 0 refills | Status: DC
  Filled 2022-11-17: qty 30, 30d supply, fill #0

## 2022-11-17 NOTE — Telephone Encounter (Addendum)
Patient called to follow up on this, She is out of medications and has been for 5 days. She has her CPE on 12/06/22

## 2022-11-30 ENCOUNTER — Telehealth: Payer: Self-pay | Admitting: Internal Medicine

## 2022-11-30 DIAGNOSIS — E782 Mixed hyperlipidemia: Secondary | ICD-10-CM

## 2022-11-30 DIAGNOSIS — R931 Abnormal findings on diagnostic imaging of heart and coronary circulation: Secondary | ICD-10-CM

## 2022-11-30 DIAGNOSIS — K760 Fatty (change of) liver, not elsewhere classified: Secondary | ICD-10-CM

## 2022-11-30 NOTE — Telephone Encounter (Signed)
Pt states she has been trying to get approved on some medications and now her insurance has changed (see previous phone note). She would like to speak to RN about it

## 2022-11-30 NOTE — Telephone Encounter (Signed)
Contacted patient who states she got approved for Medicaid, she does not work now and she just got notified she was approved for this. She would like to know if this would change any information for her Repatha, she is now overdue for an injection.   She is also questioning Livalo? If she can do this.   Advised I would send a message to RN to review further since I know she was working on this.   Thank you!

## 2022-11-30 NOTE — Telephone Encounter (Signed)
I don't think Medicaid has a preference for PCSK9i, should be able to submit for whichever she prefers. I don't think they cover Leqvio well though.

## 2022-11-30 NOTE — Telephone Encounter (Signed)
Spoke with patient of Dr. Debara Pickett. She previously had [place] insurance thru marketplace. She now has Medicaid.   Medicaid ID:  [id number] N  Patient is overdue x1 week for PCSK9i dose. Provided repatha sample x1.   Patient had been on Praluent > [employer] preferred Repatha but was waiting on PA/appeal for this. Now with change in insurance, unsure which PCSK9i Medicaid prefers.   Will route to pharmacy team for assistance with PA(s) for PCSK9i and Livalo

## 2022-12-01 NOTE — Telephone Encounter (Signed)
Per patient message/call, she now has [place] medicaid. Will have to resubmit authorizations

## 2022-12-02 MED ORDER — PRALUENT 150 MG/ML ~~LOC~~ SOAJ
150.0000 mg | SUBCUTANEOUS | 3 refills | Status: DC
  Filled 2022-12-02: qty 6, 84d supply, fill #0
  Filled 2023-02-20: qty 6, 84d supply, fill #1
  Filled 2023-05-25: qty 6, 84d supply, fill #2

## 2022-12-02 NOTE — Telephone Encounter (Signed)
PA for Praluent sent to [facility] Key: [id number] I accidentally hit send to plan before I attached the chart notes Someone from the plan called and said she I needed to re-do it in covermymeds, they didn't have a fax line I could send chart notes to. However, I am trying to re-do it and it says plan can't respond with questions contact plan. Finally able to resubmit with attached documents

## 2022-12-02 NOTE — Telephone Encounter (Signed)
Livalo denied

## 2022-12-02 NOTE — Telephone Encounter (Signed)
PA for Praluent approved through 12/02/23.  Pt made aware. Rx sent to [facility] per pt request She still needs PA for Livalo '1mg'$  done. I will submit that.  Key: [id number]

## 2022-12-03 ENCOUNTER — Other Ambulatory Visit (HOSPITAL_COMMUNITY): Payer: Self-pay

## 2022-12-05 ENCOUNTER — Other Ambulatory Visit (HOSPITAL_COMMUNITY): Payer: Self-pay

## 2022-12-05 MED ORDER — ROSUVASTATIN CALCIUM 5 MG PO TABS
5.0000 mg | ORAL_TABLET | Freq: Every day | ORAL | 3 refills | Status: DC
  Filled 2022-12-05: qty 90, 90d supply, fill #0
  Filled 2023-02-20: qty 90, 90d supply, fill #1

## 2022-12-05 NOTE — Telephone Encounter (Signed)
Pixie Casino, MD  You5 hours ago (8:10 AM)    Would substitute rosuvastatin 5 mg daily - check liver enzymes in 2 weeks, repeat lipid in 3 months  [place]    MyChart message sent to patient about this change

## 2022-12-06 ENCOUNTER — Encounter: Payer: Self-pay | Admitting: Internal Medicine

## 2022-12-06 ENCOUNTER — Other Ambulatory Visit (HOSPITAL_COMMUNITY): Payer: Self-pay

## 2022-12-06 ENCOUNTER — Ambulatory Visit: Payer: Medicaid Other | Admitting: Internal Medicine

## 2022-12-06 VITALS — BP 136/88 | HR 77 | Temp 98.3°F | Resp 16 | Ht 69.0 in | Wt 159.0 lb

## 2022-12-06 DIAGNOSIS — N1831 Chronic kidney disease, stage 3a: Secondary | ICD-10-CM | POA: Insufficient documentation

## 2022-12-06 DIAGNOSIS — N182 Chronic kidney disease, stage 2 (mild): Secondary | ICD-10-CM | POA: Diagnosis not present

## 2022-12-06 DIAGNOSIS — K760 Fatty (change of) liver, not elsewhere classified: Secondary | ICD-10-CM | POA: Diagnosis not present

## 2022-12-06 DIAGNOSIS — T50905A Adverse effect of unspecified drugs, medicaments and biological substances, initial encounter: Secondary | ICD-10-CM | POA: Diagnosis not present

## 2022-12-06 DIAGNOSIS — I10 Essential (primary) hypertension: Secondary | ICD-10-CM

## 2022-12-06 LAB — URINALYSIS, ROUTINE W REFLEX MICROSCOPIC
Bilirubin Urine: NEGATIVE
Hgb urine dipstick: NEGATIVE
Ketones, ur: NEGATIVE
Nitrite: NEGATIVE
Specific Gravity, Urine: >=1.03 — AB (ref 1.000–1.030)
Total Protein, Urine: NEGATIVE
Urine Glucose: NEGATIVE
Urobilinogen, UA: 0.2 (ref 0.0–1.0)
pH: 5 (ref 5.0–8.0)

## 2022-12-06 LAB — CBC WITH DIFFERENTIAL/PLATELET
Basophils Absolute: 0 10*3/uL (ref 0.0–0.1)
Basophils Relative: 0.5 % (ref 0.0–3.0)
Eosinophils Absolute: 0.1 10*3/uL (ref 0.0–0.7)
Eosinophils Relative: 1.5 % (ref 0.0–5.0)
HCT: 35.6 % — ABNORMAL LOW (ref 36.0–46.0)
Hemoglobin: 12 g/dL (ref 12.0–15.0)
Lymphocytes Relative: 35.9 % (ref 12.0–46.0)
Lymphs Abs: 1.8 10*3/uL (ref 0.7–4.0)
MCHC: 33.8 g/dL (ref 30.0–36.0)
MCV: 97.7 fl (ref 78.0–100.0)
Monocytes Absolute: 0.5 10*3/uL (ref 0.1–1.0)
Monocytes Relative: 10.1 % (ref 3.0–12.0)
Neutro Abs: 2.6 10*3/uL (ref 1.4–7.7)
Neutrophils Relative %: 52 % (ref 43.0–77.0)
Platelets: 247 10*3/uL (ref 150.0–400.0)
RBC: 3.64 Mil/uL — ABNORMAL LOW (ref 3.87–5.11)
RDW: 12.5 % (ref 11.5–15.5)
WBC: 4.9 10*3/uL (ref 4.0–10.5)

## 2022-12-06 LAB — HEPATIC FUNCTION PANEL
ALT: 14 U/L (ref 0–35)
AST: 24 U/L (ref 0–37)
Albumin: 4.7 g/dL (ref 3.5–5.2)
Alkaline Phosphatase: 54 U/L (ref 39–117)
Bilirubin, Direct: 0.1 mg/dL (ref 0.0–0.3)
Total Bilirubin: 0.5 mg/dL (ref 0.2–1.2)
Total Protein: 7.8 g/dL (ref 6.0–8.3)

## 2022-12-06 LAB — BASIC METABOLIC PANEL
BUN: 24 mg/dL — ABNORMAL HIGH (ref 6–23)
CO2: 29 mEq/L (ref 19–32)
Calcium: 10.6 mg/dL — ABNORMAL HIGH (ref 8.4–10.5)
Chloride: 102 mEq/L (ref 96–112)
Creatinine, Ser: 1.23 mg/dL — ABNORMAL HIGH (ref 0.40–1.20)
GFR: 46.78 mL/min — ABNORMAL LOW (ref >60.00)
Glucose, Bld: 105 mg/dL — ABNORMAL HIGH (ref 70–99)
Potassium: 4.7 mEq/L (ref 3.5–5.1)
Sodium: 142 mEq/L (ref 135–145)

## 2022-12-06 LAB — TSH: TSH: 2.05 u[IU]/mL (ref 0.35–5.50)

## 2022-12-06 MED ORDER — OLMESARTAN MEDOXOMIL 20 MG PO TABS
20.0000 mg | ORAL_TABLET | Freq: Every day | ORAL | 0 refills | Status: DC
  Filled 2022-12-06 – 2022-12-12 (×2): qty 90, 90d supply, fill #0

## 2022-12-06 MED ORDER — NEBIVOLOL HCL 5 MG PO TABS
5.0000 mg | ORAL_TABLET | Freq: Every day | ORAL | 0 refills | Status: DC
  Filled 2022-12-06 – 2022-12-27 (×3): qty 90, 90d supply, fill #0

## 2022-12-06 NOTE — Progress Notes (Unsigned)
Subjective:  Patient ID: [name] [name], female    DOB: [birth year]  Age: 62 y.o. MRN: [id number]  CC: Hypertension   HPI HARLYNN KIMBELL presents for f/up -   She is active and denies chest pain, shortness of breath, diaphoresis, or edema.  Outpatient Medications Prior to Visit  Medication Sig Dispense Refill   Alirocumab (PRALUENT) 150 MG/ML SOAJ Inject 150 mg into the skin every 14 (fourteen) days. 6 mL 3   Calcium Citrate-Vitamin D 315-250 MG-UNIT TABS Take 1 tablet by mouth 2 (two) times daily.     Loteprednol Etabonate (EYSUVIS) 0.25 % SUSP Place 1 drop into both eyes 4 (four) times daily as directed 8.3 mL 0   MULTIPLE VITAMINS-MINERALS PO Take 1 tablet by mouth daily.     Omega-3 Fatty Acids (FISH OIL) 1000 MG CAPS Take 1,000 mg by mouth daily.     pantoprazole (PROTONIX) 40 MG tablet Take 1 tablet (40 mg total) by mouth daily. 90 tablet 0   rosuvastatin (CRESTOR) 5 MG tablet Take 1 tablet (5 mg total) by mouth daily. 90 tablet 3   Turmeric 500 MG CAPS Take 500 mg by mouth 2 (two) times daily.     valACYclovir (VALTREX) 1000 MG tablet Take 1/2 tablet by mouth daily for suppression. Take 2 tablets in the morning & evening for 1 day as needed 90 tablet 4   indapamide (LOZOL) 1.25 MG tablet Take 1 tablet (1.25 mg total) by mouth daily. 30 tablet 0   nebivolol (BYSTOLIC) 5 MG tablet Take 1 tablet (5 mg total) by mouth daily. 30 tablet 0   olmesartan (BENICAR) 20 MG tablet Take 1 tablet (20 mg total) by mouth daily. 30 tablet 0   No facility-administered medications prior to visit.    ROS Review of Systems  Constitutional: Negative.  Negative for diaphoresis and fatigue.  HENT: Negative.    Eyes: Negative.   Respiratory:  Negative for cough, chest tightness, shortness of breath and wheezing.   Cardiovascular:  Negative for chest pain, palpitations and leg swelling.  Gastrointestinal:  Negative for abdominal pain, constipation, diarrhea and vomiting.  Endocrine: Negative.    Genitourinary: Negative.  Negative for difficulty urinating and dysuria.  Musculoskeletal: Negative.   Skin: Negative.   Neurological:  Negative for dizziness and weakness.  Hematological:  Negative for adenopathy. Does not bruise/bleed easily.  Psychiatric/Behavioral: Negative.      Objective:  BP 136/88 (BP Location: Left Arm, Patient Position: Sitting, Cuff Size: Large)   Pulse 77   Temp 98.3 F (36.8 C) (Oral)   Resp 16   Ht '5\' 9"'$  (1.753 m)   Wt 159 lb (72.1 kg)   SpO2 95%   BMI 23.48 kg/m   BP Readings from Last 3 Encounters:  12/06/22 136/88  02/16/22 124/80  02/10/22 136/78    Wt Readings from Last 3 Encounters:  12/06/22 159 lb (72.1 kg)  02/16/22 169 lb (76.7 kg)  02/10/22 170 lb (77.1 kg)    Physical Exam Vitals reviewed.  HENT:     Nose: Nose normal.     Mouth/Throat:     Mouth: Mucous membranes are moist.  Eyes:     General: No scleral icterus.    Conjunctiva/sclera: Conjunctivae normal.  Cardiovascular:     Rate and Rhythm: Normal rate and regular rhythm.     Heart sounds: No murmur heard.    No gallop.  Pulmonary:     Effort: Pulmonary effort is normal.     Breath  sounds: No stridor. No wheezing, rhonchi or rales.  Abdominal:     General: Abdomen is flat.     Palpations: There is no mass.     Tenderness: There is no abdominal tenderness. There is no guarding.     Hernia: No hernia is present.  Musculoskeletal:        General: Normal range of motion.     Cervical back: Neck supple.     Right lower leg: No edema.     Left lower leg: No edema.  Lymphadenopathy:     Cervical: No cervical adenopathy.  Skin:    General: Skin is warm and dry.  Neurological:     General: No focal deficit present.     Mental Status: She is alert. Mental status is at baseline.  Psychiatric:        Mood and Affect: Mood normal.        Behavior: Behavior normal.     Lab Results  Component Value Date   WBC 4.9 12/06/2022   HGB 12.0 12/06/2022   HCT 35.6  (L) 12/06/2022   PLT 247.0 12/06/2022   GLUCOSE 105 (H) 12/06/2022   CHOL 131 02/10/2022   TRIG 169 (H) 02/10/2022   HDL 65 02/10/2022   LDLDIRECT 146.0 11/21/2017   LDLCALC 39 02/10/2022   ALT 14 12/06/2022   AST 24 12/06/2022   NA 142 12/06/2022   K 4.7 12/06/2022   CL 102 12/06/2022   CREATININE 1.23 (H) 12/06/2022   BUN 24 (H) 12/06/2022   CO2 29 12/06/2022   TSH 2.05 12/06/2022   INR 1.1 (H) 11/18/2021   HGBA1C 5.1 12/18/2017   MICROALBUR 2.4 (H) 05/27/2020    MM 3D SCREEN BREAST W/IMPLANT BILATERAL  Result Date: 11/24/2021 CLINICAL DATA:  Screening. EXAM: DIGITAL SCREENING BILATERAL MAMMOGRAM WITH IMPLANTS, CAD AND TOMOSYNTHESIS TECHNIQUE: Bilateral screening digital craniocaudal and mediolateral oblique mammograms were obtained. Bilateral screening digital breast tomosynthesis was performed. The images were evaluated with computer-aided detection. Standard and/or implant displaced views were performed. COMPARISON:  Previous exam(s). ACR Breast Density Category b: There are scattered areas of fibroglandular density. FINDINGS: The patient has retroglandular implants. There are no findings suspicious for malignancy. IMPRESSION: No mammographic evidence of malignancy. A result letter of this screening mammogram will be mailed directly to the patient. RECOMMENDATION: Screening mammogram in one year. (Code:SM-B-01Y) BI-RADS CATEGORY  1:  Negative. Electronically Signed   By: Marin Olp M.D.   On: 11/24/2021 12:16    Assessment & Plan:   Angeletta was seen today for hypertension.  Diagnoses and all orders for this visit:  Primary hypertension- She has a prerenal azotemia and hypercalcemia.  Will discontinue the thiazide diuretic but continue the ARB and beta-blocker. -     Urinalysis, Routine w reflex microscopic; Future -     TSH; Future -     CBC with Differential/Platelet; Future -     Basic metabolic panel; Future -     Basic metabolic panel -     CBC with  Differential/Platelet -     TSH -     Urinalysis, Routine w reflex microscopic -     olmesartan (BENICAR) 20 MG tablet; Take 1 tablet (20 mg total) by mouth daily. -     nebivolol (BYSTOLIC) 5 MG tablet; Take 1 tablet (5 mg total) by mouth daily.  NAFLD (nonalcoholic fatty liver disease)- Her liver enzymes have improved. 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 Buckbee's indapamide. I am also having her maintain her Fish Oil, Calcium Citrate-Vitamin D, MULTIPLE VITAMINS-MINERALS PO, Turmeric, Eysuvis, valACYclovir, pantoprazole, Praluent, rosuvastatin, olmesartan, and nebivolol.  Meds ordered this encounter  Medications   olmesartan (BENICAR) 20 MG tablet    Sig: Take 1 tablet (20 mg total) by mouth daily.    Dispense:  90 tablet    Refill:  0   nebivolol (BYSTOLIC) 5 MG tablet    Sig: Take 1 tablet (5 mg total) by mouth daily.    Dispense:  90 tablet    Refill:  0     Follow-up: Return in about 6 months (around 06/06/2023).  Scarlette Calico, MD

## 2022-12-06 NOTE — Patient Instructions (Signed)
Hypertension, Adult High blood pressure (hypertension) is when the force of blood pumping through the arteries is too strong. The arteries are the blood vessels that carry blood from the heart throughout the body. Hypertension forces the heart to work harder to pump blood and may cause arteries to become narrow or stiff. Untreated or uncontrolled hypertension can lead to a heart attack, heart failure, a stroke, kidney disease, and other problems. A blood pressure reading consists of a higher number over a lower number. Ideally, your blood pressure should be below 120/80. The first ("top") number is called the systolic pressure. It is a measure of the pressure in your arteries as your heart beats. The second ("bottom") number is called the diastolic pressure. It is a measure of the pressure in your arteries as the heart relaxes. What are the causes? The exact cause of this condition is not known. There are some conditions that result in high blood pressure. What increases the risk? Certain factors may make you more likely to develop high blood pressure. Some of these risk factors are under your control, including: Smoking. Not getting enough exercise or physical activity. Being overweight. Having too much fat, sugar, calories, or salt (sodium) in your diet. Drinking too much alcohol. Other risk factors include: Having a personal history of heart disease, diabetes, high cholesterol, or kidney disease. Stress. Having a family history of high blood pressure and high cholesterol. Having obstructive sleep apnea. Age. The risk increases with age. What are the signs or symptoms? High blood pressure may not cause symptoms. Very high blood pressure (hypertensive crisis) may cause: Headache. Fast or irregular heartbeats (palpitations). Shortness of breath. Nosebleed. Nausea and vomiting. Vision changes. Severe chest pain, dizziness, and seizures. How is this diagnosed? This condition is diagnosed by  measuring your blood pressure while you are seated, with your arm resting on a flat surface, your legs uncrossed, and your feet flat on the floor. The cuff of the blood pressure monitor will be placed directly against the skin of your upper arm at the level of your heart. Blood pressure should be measured at least twice using the same arm. Certain conditions can cause a difference in blood pressure between your right and left arms. If you have a high blood pressure reading during one visit or you have normal blood pressure with other risk factors, you may be asked to: Return on a different day to have your blood pressure checked again. Monitor your blood pressure at home for 1 week or longer. If you are diagnosed with hypertension, you may have other blood or imaging tests to help your health care provider understand your overall risk for other conditions. How is this treated? This condition is treated by making healthy lifestyle changes, such as eating healthy foods, exercising more, and reducing your alcohol intake. You may be referred for counseling on a healthy diet and physical activity. Your health care provider may prescribe medicine if lifestyle changes are not enough to get your blood pressure under control and if: Your systolic blood pressure is above 130. Your diastolic blood pressure is above 80. Your personal target blood pressure may vary depending on your medical conditions, your age, and other factors. Follow these instructions at home: Eating and drinking  Eat a diet that is high in fiber and potassium, and low in sodium, added sugar, and fat. An example of this eating plan is called the DASH diet. DASH stands for Dietary Approaches to Stop Hypertension. To eat this way: Eat   plenty of fresh fruits and vegetables. Try to fill one half of your plate at each meal with fruits and vegetables. Eat whole grains, such as whole-wheat pasta, brown rice, or whole-grain bread. Fill about one  fourth of your plate with whole grains. Eat or drink low-fat dairy products, such as skim milk or low-fat yogurt. Avoid fatty cuts of meat, processed or cured meats, and poultry with skin. Fill about one fourth of your plate with lean proteins, such as fish, chicken without skin, beans, eggs, or tofu. Avoid pre-made and processed foods. These tend to be higher in sodium, added sugar, and fat. Reduce your daily sodium intake. Many people with hypertension should eat less than 1,500 mg of sodium a day. Do not drink alcohol if: Your health care provider tells you not to drink. You are pregnant, may be pregnant, or are planning to become pregnant. If you drink alcohol: Limit how much you have to: 0-1 drink a day for women. 0-2 drinks a day for men. Know how much alcohol is in your drink. In the [place] one drink equals one 12 oz bottle of beer (355 mL), one 5 oz glass of wine (148 mL), or one 1 oz glass of hard liquor (44 mL). Lifestyle  Work with your health care provider to maintain a healthy body weight or to lose weight. Ask what an ideal weight is for you. Get at least 30 minutes of exercise that causes your heart to beat faster (aerobic exercise) most days of the week. Activities may include walking, swimming, or biking. Include exercise to strengthen your muscles (resistance exercise), such as Pilates or lifting weights, as part of your weekly exercise routine. Try to do these types of exercises for 30 minutes at least 3 days a week. Do not use any products that contain nicotine or tobacco. These products include cigarettes, chewing tobacco, and vaping devices, such as e-cigarettes. If you need help quitting, ask your health care provider. Monitor your blood pressure at home as told by your health care provider. Keep all follow-up visits. This is important. Medicines Take over-the-counter and prescription medicines only as told by your health care provider. Follow directions carefully. Blood  pressure medicines must be taken as prescribed. Do not skip doses of blood pressure medicine. Doing this puts you at risk for problems and can make the medicine less effective. Ask your health care provider about side effects or reactions to medicines that you should watch for. Contact a health care provider if you: Think you are having a reaction to a medicine you are taking. Have headaches that keep coming back (recurring). Feel dizzy. Have swelling in your ankles. Have trouble with your vision. Get help right away if you: Develop a severe headache or confusion. Have unusual weakness or numbness. Feel faint. Have severe pain in your chest or abdomen. Vomit repeatedly. Have trouble breathing. These symptoms may be an emergency. Get help right away. Call 911. Do not wait to see if the symptoms will go away. Do not drive yourself to the hospital. Summary Hypertension is when the force of blood pumping through your arteries is too strong. If this condition is not controlled, it may put you at risk for serious complications. Your personal target blood pressure may vary depending on your medical conditions, your age, and other factors. For most people, a normal blood pressure is less than 120/80. Hypertension is treated with lifestyle changes, medicines, or a combination of both. Lifestyle changes include losing weight, eating a healthy,   low-sodium diet, exercising more, and limiting alcohol. This information is not intended to replace advice given to you by your health care provider. Make sure you discuss any questions you have with your health care provider. Document Revised: 09/07/2021 Document Reviewed: 09/07/2021 Elsevier Patient Education  2023 Elsevier Inc.  

## 2022-12-07 ENCOUNTER — Other Ambulatory Visit (HOSPITAL_COMMUNITY): Payer: Self-pay

## 2022-12-07 NOTE — Telephone Encounter (Signed)
Rx(s) sent to pharmacy electronically - per patient MyChart message

## 2022-12-12 ENCOUNTER — Other Ambulatory Visit: Payer: Self-pay | Admitting: Internal Medicine

## 2022-12-12 ENCOUNTER — Other Ambulatory Visit (HOSPITAL_COMMUNITY): Payer: Self-pay

## 2022-12-12 MED ORDER — PANTOPRAZOLE SODIUM 40 MG PO TBEC
40.0000 mg | DELAYED_RELEASE_TABLET | Freq: Every day | ORAL | 1 refills | Status: DC
  Filled 2022-12-12 – 2022-12-13 (×2): qty 90, 90d supply, fill #0
  Filled 2023-02-20: qty 90, 90d supply, fill #1

## 2022-12-13 ENCOUNTER — Other Ambulatory Visit (HOSPITAL_COMMUNITY): Payer: Self-pay

## 2022-12-13 ENCOUNTER — Other Ambulatory Visit: Payer: Self-pay

## 2022-12-14 ENCOUNTER — Other Ambulatory Visit: Payer: Self-pay | Admitting: *Deleted

## 2022-12-14 DIAGNOSIS — T466X5A Adverse effect of antihyperlipidemic and antiarteriosclerotic drugs, initial encounter: Secondary | ICD-10-CM

## 2022-12-14 DIAGNOSIS — K760 Fatty (change of) liver, not elsewhere classified: Secondary | ICD-10-CM

## 2022-12-14 DIAGNOSIS — E782 Mixed hyperlipidemia: Secondary | ICD-10-CM

## 2022-12-15 ENCOUNTER — Ambulatory Visit
Admission: RE | Admit: 2022-12-15 | Discharge: 2022-12-15 | Disposition: A | Discharge status: Home / Self Care | Payer: Medicaid Other | Source: Ambulatory Visit | Attending: Internal Medicine | Admitting: Internal Medicine

## 2022-12-15 DIAGNOSIS — Z419 Encounter for procedure for purposes other than remedying health state, unspecified: Secondary | ICD-10-CM | POA: Diagnosis not present

## 2022-12-15 DIAGNOSIS — N1831 Chronic kidney disease, stage 3a: Secondary | ICD-10-CM

## 2022-12-27 ENCOUNTER — Other Ambulatory Visit: Payer: Self-pay

## 2022-12-27 ENCOUNTER — Telehealth: Payer: Self-pay | Admitting: Internal Medicine

## 2022-12-27 NOTE — Telephone Encounter (Signed)
Patient called and said her pharmacy has sent in a prior authorization request to get her nebivolol (BYSTOLIC) 5 MG tablet  filled. She would like it sent to [facility] ASAP. She said she has about 1 weeks worth left of the medication. Best callback number is [phone]

## 2022-12-28 ENCOUNTER — Other Ambulatory Visit (HOSPITAL_COMMUNITY): Payer: Self-pay

## 2022-12-29 ENCOUNTER — Other Ambulatory Visit (HOSPITAL_COMMUNITY): Payer: Self-pay

## 2022-12-29 NOTE — Telephone Encounter (Signed)
Patient Advocate Encounter   Received notification from [facility] that prior authorization for Nebivolol is required.   PA submitted on 12/29/2022 Key [id number] Status is pending

## 2022-12-29 NOTE — Telephone Encounter (Signed)
Pharmacy Patient Advocate Encounter   Received notification that prior authorization for Nebivolol 67m  is required/requested.  Per Test Claim: Prior authorizaiton required   PA [facility] on 12/29/22 [facility] [place]eds Key [id number] Status is pending

## 2022-12-29 NOTE — Telephone Encounter (Signed)
Pharmacy Patient Advocate Encounter  Received notification from [facility] that the request for prior authorization for Nebivolol has been denied due to .    Please be advised we currently do not have a Pharmacist to review denials, therefore you will need to process appeals accordingly as needed. Thanks for your support at this time.   If appeal is needed, please see below:

## 2022-12-30 ENCOUNTER — Encounter: Payer: Self-pay | Admitting: Internal Medicine

## 2022-12-30 NOTE — Telephone Encounter (Signed)
Appeal letter faxed.

## 2022-12-30 NOTE — Telephone Encounter (Signed)
Appeal Letter to be sent

## 2023-01-03 DIAGNOSIS — K760 Fatty (change of) liver, not elsewhere classified: Secondary | ICD-10-CM | POA: Diagnosis not present

## 2023-01-03 DIAGNOSIS — R931 Abnormal findings on diagnostic imaging of heart and coronary circulation: Secondary | ICD-10-CM | POA: Diagnosis not present

## 2023-01-03 DIAGNOSIS — E782 Mixed hyperlipidemia: Secondary | ICD-10-CM | POA: Diagnosis not present

## 2023-01-04 LAB — HEPATIC FUNCTION PANEL
ALT: 14 IU/L (ref 0–32)
AST: 27 IU/L (ref 0–40)
Albumin: 5.2 g/dL — ABNORMAL HIGH (ref 3.9–4.9)
Alkaline Phosphatase: 68 IU/L (ref 44–121)
Bilirubin Total: 0.5 mg/dL (ref 0.0–1.2)
Bilirubin, Direct: 0.18 mg/dL (ref 0.00–0.40)
Total Protein: 7.6 g/dL (ref 6.0–8.5)

## 2023-01-06 ENCOUNTER — Telehealth: Payer: Self-pay | Admitting: Internal Medicine

## 2023-01-06 NOTE — Telephone Encounter (Signed)
Error

## 2023-01-06 NOTE — Telephone Encounter (Signed)
Pt called asking what she need to do about her Rx Nebivolol . Pt stated her insurance no longer covers it and she is completely out is there any alternates that can be given and cover by her insurance. Pt would would like a phone call with update.

## 2023-01-08 ENCOUNTER — Other Ambulatory Visit: Payer: Self-pay | Admitting: Internal Medicine

## 2023-01-08 DIAGNOSIS — I10 Essential (primary) hypertension: Secondary | ICD-10-CM

## 2023-01-08 MED ORDER — CARVEDILOL 3.125 MG PO TABS
3.1250 mg | ORAL_TABLET | Freq: Two times a day (BID) | ORAL | 1 refills | Status: DC
  Filled 2023-01-08 – 2023-01-09 (×2): qty 180, 90d supply, fill #0
  Filled 2023-02-20 – 2023-03-30 (×2): qty 180, 90d supply, fill #1

## 2023-01-09 ENCOUNTER — Other Ambulatory Visit (HOSPITAL_COMMUNITY): Payer: Self-pay

## 2023-01-09 ENCOUNTER — Other Ambulatory Visit: Payer: Self-pay

## 2023-01-13 DIAGNOSIS — Z419 Encounter for procedure for purposes other than remedying health state, unspecified: Secondary | ICD-10-CM | POA: Diagnosis not present

## 2023-02-13 DIAGNOSIS — Z419 Encounter for procedure for purposes other than remedying health state, unspecified: Secondary | ICD-10-CM | POA: Diagnosis not present

## 2023-02-20 ENCOUNTER — Other Ambulatory Visit: Payer: Self-pay | Admitting: Internal Medicine

## 2023-02-20 ENCOUNTER — Other Ambulatory Visit (HOSPITAL_COMMUNITY): Payer: Self-pay

## 2023-02-20 DIAGNOSIS — I10 Essential (primary) hypertension: Secondary | ICD-10-CM

## 2023-02-20 MED ORDER — OLMESARTAN MEDOXOMIL 20 MG PO TABS
20.0000 mg | ORAL_TABLET | Freq: Every day | ORAL | 0 refills | Status: DC
  Filled 2023-02-20: qty 90, 90d supply, fill #0

## 2023-02-21 ENCOUNTER — Other Ambulatory Visit: Payer: Self-pay

## 2023-02-21 ENCOUNTER — Other Ambulatory Visit (HOSPITAL_COMMUNITY): Payer: Self-pay

## 2023-02-23 ENCOUNTER — Other Ambulatory Visit (HOSPITAL_COMMUNITY): Payer: Self-pay

## 2023-03-06 ENCOUNTER — Other Ambulatory Visit (INDEPENDENT_AMBULATORY_CARE_PROVIDER_SITE_OTHER): Payer: Medicaid Other

## 2023-03-06 ENCOUNTER — Other Ambulatory Visit: Payer: Self-pay | Admitting: Internal Medicine

## 2023-03-06 ENCOUNTER — Telehealth: Payer: Self-pay | Admitting: Internal Medicine

## 2023-03-06 DIAGNOSIS — T50905A Adverse effect of unspecified drugs, medicaments and biological substances, initial encounter: Secondary | ICD-10-CM

## 2023-03-06 DIAGNOSIS — N1831 Chronic kidney disease, stage 3a: Secondary | ICD-10-CM

## 2023-03-06 DIAGNOSIS — K760 Fatty (change of) liver, not elsewhere classified: Secondary | ICD-10-CM | POA: Diagnosis not present

## 2023-03-06 DIAGNOSIS — E782 Mixed hyperlipidemia: Secondary | ICD-10-CM | POA: Diagnosis not present

## 2023-03-06 DIAGNOSIS — M791 Myalgia, unspecified site: Secondary | ICD-10-CM | POA: Diagnosis not present

## 2023-03-06 DIAGNOSIS — I10 Essential (primary) hypertension: Secondary | ICD-10-CM

## 2023-03-06 DIAGNOSIS — T466X5A Adverse effect of antihyperlipidemic and antiarteriosclerotic drugs, initial encounter: Secondary | ICD-10-CM | POA: Diagnosis not present

## 2023-03-06 LAB — BASIC METABOLIC PANEL
BUN: 17 mg/dL (ref 6–23)
CO2: 28 mEq/L (ref 19–32)
Calcium: 10.1 mg/dL (ref 8.4–10.5)
Chloride: 104 mEq/L (ref 96–112)
Creatinine, Ser: 0.66 mg/dL (ref 0.40–1.20)
GFR: 93.16 mL/min (ref >60.00)
Glucose, Bld: 104 mg/dL — ABNORMAL HIGH (ref 70–99)
Potassium: 4.1 mEq/L (ref 3.5–5.1)
Sodium: 140 mEq/L (ref 135–145)

## 2023-03-06 NOTE — Telephone Encounter (Signed)
I have pt wanting Dr. Yetta Barre to order her labs pt in office. Dr Yetta Barre wanted her to come back in 3 months

## 2023-03-10 ENCOUNTER — Ambulatory Visit: Payer: Medicaid Other | Attending: Internal Medicine | Admitting: Internal Medicine

## 2023-03-10 ENCOUNTER — Encounter: Payer: Self-pay | Admitting: Internal Medicine

## 2023-03-10 VITALS — BP 112/76 | HR 88 | Ht 69.0 in | Wt 158.8 lb

## 2023-03-10 DIAGNOSIS — E782 Mixed hyperlipidemia: Secondary | ICD-10-CM | POA: Diagnosis not present

## 2023-03-10 DIAGNOSIS — M791 Myalgia, unspecified site: Secondary | ICD-10-CM | POA: Diagnosis not present

## 2023-03-10 DIAGNOSIS — T466X5A Adverse effect of antihyperlipidemic and antiarteriosclerotic drugs, initial encounter: Secondary | ICD-10-CM | POA: Diagnosis not present

## 2023-03-10 DIAGNOSIS — R931 Abnormal findings on diagnostic imaging of heart and coronary circulation: Secondary | ICD-10-CM | POA: Diagnosis not present

## 2023-03-10 NOTE — Progress Notes (Signed)
Virtual Visit via Video Note   Because of [name]'s co-morbid illnesses, she is at least at moderate risk for complications without adequate follow up.  This format is felt to be most appropriate for this patient at this time.  All issues noted in this document were discussed and addressed.  A limited physical exam was performed with this format.  Please refer to the patient's chart for her consent to telehealth for [facility].      Date:  03/10/2023   ID:  [name] [name], DOB [birth year], MRN [id number] The patient was identified using 2 identifiers.  Evaluation Performed:  Follow-Up Visit  Patient Location:  [address] [place] [address]  Provider location:   [address], [address] [place], [place] [address]  PCP:  Etta Grandchild, MD  Cardiologist:  None Electrophysiologist:  None   Chief Complaint:  No complaints  History of Present Illness:    [name] [name] is a 62 y.o. female who presents via audio/video conferencing for a telehealth visit today. She has a past medial history significant for cerebral aneurysm, hypertension, dyslipidemia and family history of hypertension and dyslipidemia, but no significant early onset heart disease.  She was noted to have dyslipidemia in the past and this was monitored.  When she changed primary care providers she was started on statin therapy which caused a marked reduction in cholesterol.  The time this was atorvastatin 10 mg, however she had significant elevation of liver enzymes.  An abdominal ultrasound in 2018 demonstrated fatty liver disease is the likely cause of this.  After stopping the statin therapy her liver enzymes returned to baseline which was borderline abnormal.  She was seen by Prudence Davidson, PharmD, for evaluation of her dyslipidemia as a new patient. Nicholaus Bloom recommended trying Livalo 1 mg daily.  [name] reports compliance with this medication and had liver enzyme testing 2 months ago  which indicated stable liver enzymes.  I suspect she is tolerating this well as the statin is predominantly glucuronidated.  However, it is a low to moderate intensity statin at various doses and may not reduce her cholesterol significantly.  We further discussed her diet today which she felt was very healthy.  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  She reports a very deep knowledge of healthy diet.  There is no documented ASCVD.  05/01/2018  [name] [name] returns today for follow-up.  She has aggressively tried to alter her diet as well as maintain compliance with Livalo 1 mg daily.  We repeated recent lab work which indicates reduction in cholesterol the 271, triglycerides were 154 (reduced from 290), HDL increased from 45-73 and calculated LDL was 167 (up from 146).  This does represent an increase LDL, however she has had particle shifting.  Liver enzymes have remained stable he mild elevated less than 2 times the upper limit of normal.  She underwent coronary artery calcium scoring, which demonstrated a calcium score of 6.  There was some scattered sub-pulmonary nodules and I would recommend a repeat CT scan without contrast in 1 year.  07/31/2018  [name] [name] is seen today in follow-up.  Overall she seems to be doing well.  I increased her Livalo to 2 mg daily.  This was associated with an increase in liver enzymes which was significant.  Her AST went from 21-67  and ALT from 26-138.  Again, she remains asymptomatic with this, however I have some concern given her history of underlying liver disease whether she will be tolerating this increase.  It is seems that she will not.   12/19/2018  [name] [name] returns today for follow-up.  Overall she has been doing well and tolerating Praluent.  We had to reduce the dose of her Livalo due to elevated liver enzymes.  Her most recent liver  enzymes however have normalized with AST of 33 and ALT of 23.  Her cholesterol has also dropped significantly on Praluent.  Her total cholesterol is 132, triglycerides 173 (decreased from 220), HDL 57 and LDL 40 (decreased from 167).  She is tolerating the medication without any myalgias or other side effects.  12/24/2018  [name] [name] is seen today for annual follow-up.  She continues to do fairly well.  She struggles with low back pain and some issues related to that.  She has been less physically active due to that.  Over the summer her cholesterol had gone up.  Her total was 199, triglycerides 117, HDL 55 and LDL 91 (increased from 40).  She reports compliance with her Praluent and Livalo.  She is due for refills.  She has had repeat prior authorization for the medication.   01/20/2021  [name] [name] returns today for follow-up.  She continues to do well on Praluent in addition to low-dose Livalo.  Her most recent lipid profile showed total cholesterol 139, HDL 71, triglycerides 86 and LDL 52.  She did have mild elevation ALT to 40 and in October 2021.  In the past she has had more elevated liver enzymes however for the past 2 years they have been generally normal.  She does have underlying hepatic steatosis.  02/10/2022  [name] [name] returns today for follow-up.  She said continues to do well on Praluent and Livalo.  She has not had repeat lipids but is fasting today.  She would like to have them assessed.  She denies any chest pain or worsening shortness of breath.  She is physically active.  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  She continues on Praluent and low-dose rosuvastatin 5 mg daily.  The rosuvastatin was substituted for Livalo.  Overall she feels well.  She has retired from the EP lab at [employer].  Since retirement she says she has done quite a bit better.  She has been more active and has been able to do strengthening and  rehabilitation of her knees. She did have bloodwork earlier this week, however, it is not currently available.  Prior CV studies:   The following studies were reviewed today:  Chart reviewed, labwork  PMHx:  Past Medical History:  Diagnosis Date   Bleeds easily (HCC)    Cerebral aneurysm 1999   Surgically coiled   DDD (degenerative disc disease)    GERD (gastroesophageal reflux disease)    History of Barrett's esophagus    Hypercholesterolemia    Hypertension    Irritable bowel disease    Sinus disease     Past Surgical History:  Procedure Laterality Date   ABDOMINAL HYSTERECTOMY  2005   ANAL RECTAL MANOMETRY N/A 09/14/2020   Procedure: ANO RECTAL MANOMETRY;  Surgeon: Andria Meuse, MD;  Location: [facility];  Service: General;  Laterality: N/A;   APPENDECTOMY     ARTHROTOMY Right 12/11/2015   Procedure: OPEN ARTHROTOMY ;  Surgeon: Dominica Severin, MD;  Location: [facility];  Service: Orthopedics;  Laterality: Right;   AUGMENTATION MAMMAPLASTY Bilateral    BREAST ENHANCEMENT SURGERY     CEREBRAL ANEURYSM REPAIR  1999   8 platinum coils   DIAGNOSTIC LAPAROSCOPY     KNEE ARTHROSCOPY Left 08/03/2021   Procedure: LEFT KNEE ARTHROSCOPY, PARTIAL MEDIAL MENISCECTOMY AND CONDROPLASTY;  Surgeon: Marcene Corning, MD;  Location: [facility];  Service: Orthopedics;  Laterality: Left;   LUMBAR DISC SURGERY Left 06/03/2010   Procedure: ANTEROLATERAL DECOMPRESSION AND ARTHRODESIS L3-4, L4-5   NASAL SINUS SURGERY  2013   ORIF ANKLE FRACTURE  03/01/2012   Procedure: OPEN REDUCTION INTERNAL FIXATION (ORIF) ANKLE FRACTURE;  Surgeon: Toni Arthurs, MD;  Location: [facility];  Service: Orthopedics;  Laterality: Right;  Open reduction internal fixation right navicular fracture   REFRACTIVE SURGERY     SHOULDER ARTHROSCOPY Left    SYNOVECTOMY Right 12/11/2015   Procedure: SYNOVECTOMY DISTAL RADIOULNAR JOINT LOOSE BODY REMOVAL ;  Surgeon: Dominica Severin, MD;   Location: [facility];  Service: Orthopedics;  Laterality: Right;   WRIST ARTHROSCOPY Right 12/11/2015   Procedure: RIGHT WRIST ARTHROSCOPY WITH DEBRIDEMENT ;  Surgeon: Dominica Severin, MD;  Location: [facility];  Service: Orthopedics;  Laterality: Right;    FAMHx:  Family History  Problem Relation Age of Onset   Hypertension Mother    Osteopenia Mother    Hypertension Father    Prostate cancer Father    Osteopenia Maternal Grandmother     SOCHx:   reports that she quit smoking about 38 years ago. Her smoking use included cigarettes. She has never used smokeless tobacco. She reports current alcohol use of about 24.0 standard drinks of alcohol per week. She reports that she does not use drugs.  ALLERGIES:  Allergies  Allergen Reactions   Hydrocodone-Acetaminophen Itching   Lipitor [Atorvastatin Calcium] Other (See Comments)    Elevated liver enzymes   Morphine And Related Nausea Only   Tetracyclines & Related Rash    Break out on all extremties    MEDS:  Current Meds  Medication Sig   Alirocumab (PRALUENT) 150 MG/ML SOAJ Inject 150 mg into the skin every 14 (fourteen) days.   Calcium Citrate-Vitamin D 315-250 MG-UNIT TABS Take 1 tablet by mouth 2 (two) times daily.   carvedilol (COREG) 3.125 MG tablet Take 1 tablet (3.125 mg total) by mouth 2 (two) times daily with a meal.   MULTIPLE VITAMINS-MINERALS PO Take 1 tablet by mouth daily.   olmesartan (BENICAR) 20 MG tablet Take 1 tablet (20 mg total) by mouth daily.   Omega-3 Fatty Acids (FISH OIL) 1000 MG CAPS Take 1,000 mg by mouth daily.   pantoprazole (PROTONIX) 40 MG tablet Take 1 tablet (40 mg total) by mouth daily.   PREBIOTIC PRODUCT PO Take 1 capsule by mouth daily.   rosuvastatin (CRESTOR) 5 MG tablet Take 1 tablet (5 mg total) by mouth daily.   Turmeric 500 MG CAPS Take 500 mg by mouth 2 (two) times daily.   valACYclovir (VALTREX) 1000 MG tablet Take 1/2 tablet by mouth daily for  suppression. Take 2 tablets in the morning & evening for 1 day as needed     ROS: Pertinent items noted in HPI and remainder of comprehensive ROS otherwise negative.  Labs/Other Tests and Data Reviewed:    Recent Labs: 12/06/2022: Hemoglobin 12.0; Platelets 247.0; TSH 2.05 01/03/2023: ALT 14 03/06/2023: BUN 17; Creatinine, Ser 0.66; Potassium 4.1; Sodium 140  Recent Lipid Panel Lab Results  Component Value Date/Time   CHOL 131 02/10/2022 02:45 PM   TRIG 169 (H) 02/10/2022 02:45 PM   HDL 65 02/10/2022 02:45 PM   CHOLHDL 2.0 02/10/2022 02:45 PM   CHOLHDL 2 05/27/2019 11:18 AM   LDLCALC 39 02/10/2022 02:45 PM   LDLDIRECT 146.0 11/21/2017 09:13 AM    Wt Readings from Last 3 Encounters:  03/10/23 158 lb 12.8 oz (72 kg)  12/06/22 159 lb (72.1 kg)  02/16/22 169 lb (76.7 kg)     Exam:    Vital Signs:  BP 112/76   Pulse 88   Ht 5\' 9"  (1.753 m)   Wt 158 lb 12.8 oz (72 kg)   BMI 23.45 kg/m    General appearance: alert and no distress Lungs: no wheezes Abdomen: normal weight Neurologic: Grossly normal  ASSESSMENT & PLAN:    Dyslipidemia -low 10-year risk of 5% CAC of 6 (03/2018) Family history of dyslipidemia and hypertension Hypertension-controlled NAFLD Subcentimeter pulmonary nodules  Ms. Herrmann is doing very well now after retiring. She has been focusing on her health and it shows. Blood pressure is well controlled and she is tolerating lipid-lowering therapy. She had repeat labs earlier this week and we will try to track those down.  Plan follow-up with me annually or sooner as necessary.  Patient Risk:   After full review of this patients clinical status, I feel that they are at least moderate risk at this time.  Time:   Today, I have spent 15 minutes with the patient with telehealth technology discussing dyslipidemia.     Medication Adjustments/Labs and Tests Ordered: Current medicines are reviewed at length with the patient today.  Concerns regarding medicines  are outlined above.   Tests Ordered: No orders of the defined types were placed in this encounter.   Medication Changes: No orders of the defined types were placed in this encounter.   Disposition:  in 1 year(s)  Chrystie Nose, MD, [facility], FACP  [place]  [facility]  Medical Director of the [facility]  [facility] Diplomate of the American Board of Clinical Lipidology Attending Cardiologist  Direct Dial: [phone]  Fax: [phone]  Website:  [email or web address]  Chrystie Nose, MD  03/10/2023 9:20 AM

## 2023-03-12 DIAGNOSIS — R051 Acute cough: Secondary | ICD-10-CM | POA: Diagnosis not present

## 2023-03-12 DIAGNOSIS — J209 Acute bronchitis, unspecified: Secondary | ICD-10-CM | POA: Diagnosis not present

## 2023-03-12 DIAGNOSIS — R509 Fever, unspecified: Secondary | ICD-10-CM | POA: Diagnosis not present

## 2023-03-12 DIAGNOSIS — N390 Urinary tract infection, site not specified: Secondary | ICD-10-CM | POA: Diagnosis not present

## 2023-03-12 LAB — HEPATIC FUNCTION PANEL
ALT: 21 IU/L (ref 0–32)
AST: 32 IU/L (ref 0–40)
Albumin: 4.7 g/dL (ref 3.9–4.9)
Alkaline Phosphatase: 68 IU/L (ref 44–121)
Bilirubin Total: <0.2 mg/dL (ref 0.0–1.2)
Bilirubin, Direct: <0.1 mg/dL (ref 0.00–0.40)
Total Protein: 7.1 g/dL (ref 6.0–8.5)

## 2023-03-12 LAB — LIPID PANEL
Chol/HDL Ratio: 1.7 ratio (ref 0.0–4.4)
Cholesterol, Total: 110 mg/dL (ref 100–199)
HDL: 65 mg/dL (ref >39)
LDL Chol Calc (NIH): 23 mg/dL (ref 0–99)
Triglycerides: 127 mg/dL (ref 0–149)
VLDL Cholesterol Cal: 22 mg/dL (ref 5–40)

## 2023-03-15 DIAGNOSIS — Z419 Encounter for procedure for purposes other than remedying health state, unspecified: Secondary | ICD-10-CM | POA: Diagnosis not present

## 2023-03-30 ENCOUNTER — Other Ambulatory Visit (HOSPITAL_COMMUNITY): Payer: Self-pay

## 2023-04-15 DIAGNOSIS — Z419 Encounter for procedure for purposes other than remedying health state, unspecified: Secondary | ICD-10-CM | POA: Diagnosis not present

## 2023-05-08 ENCOUNTER — Other Ambulatory Visit (HOSPITAL_COMMUNITY): Payer: Self-pay

## 2023-05-15 DIAGNOSIS — Z419 Encounter for procedure for purposes other than remedying health state, unspecified: Secondary | ICD-10-CM | POA: Diagnosis not present

## 2023-05-26 ENCOUNTER — Other Ambulatory Visit (HOSPITAL_COMMUNITY): Payer: Self-pay

## 2023-05-26 ENCOUNTER — Other Ambulatory Visit: Payer: Self-pay

## 2023-06-06 ENCOUNTER — Other Ambulatory Visit: Payer: Self-pay

## 2023-06-06 ENCOUNTER — Encounter: Payer: Self-pay | Admitting: Internal Medicine

## 2023-06-06 ENCOUNTER — Ambulatory Visit: Payer: Medicaid Other | Admitting: Internal Medicine

## 2023-06-06 ENCOUNTER — Other Ambulatory Visit (HOSPITAL_COMMUNITY): Payer: Self-pay

## 2023-06-06 VITALS — BP 156/92 | HR 72 | Temp 98.3°F | Resp 16 | Ht 69.0 in | Wt 158.0 lb

## 2023-06-06 DIAGNOSIS — N182 Chronic kidney disease, stage 2 (mild): Secondary | ICD-10-CM

## 2023-06-06 DIAGNOSIS — Z1231 Encounter for screening mammogram for malignant neoplasm of breast: Secondary | ICD-10-CM

## 2023-06-06 DIAGNOSIS — E2839 Other primary ovarian failure: Secondary | ICD-10-CM

## 2023-06-06 DIAGNOSIS — K219 Gastro-esophageal reflux disease without esophagitis: Secondary | ICD-10-CM | POA: Diagnosis not present

## 2023-06-06 DIAGNOSIS — E785 Hyperlipidemia, unspecified: Secondary | ICD-10-CM

## 2023-06-06 DIAGNOSIS — I10 Essential (primary) hypertension: Secondary | ICD-10-CM | POA: Diagnosis not present

## 2023-06-06 DIAGNOSIS — R931 Abnormal findings on diagnostic imaging of heart and coronary circulation: Secondary | ICD-10-CM | POA: Insufficient documentation

## 2023-06-06 LAB — URINALYSIS, ROUTINE W REFLEX MICROSCOPIC
Bilirubin Urine: NEGATIVE
Hgb urine dipstick: NEGATIVE
Ketones, ur: NEGATIVE
Nitrite: NEGATIVE
Specific Gravity, Urine: >=1.03 — AB (ref 1.000–1.030)
Total Protein, Urine: NEGATIVE
Urine Glucose: NEGATIVE
Urobilinogen, UA: 0.2 (ref 0.0–1.0)
pH: 5.5 (ref 5.0–8.0)

## 2023-06-06 LAB — BASIC METABOLIC PANEL
BUN: 22 mg/dL (ref 6–23)
CO2: 28 mEq/L (ref 19–32)
Calcium: 10.3 mg/dL (ref 8.4–10.5)
Chloride: 102 mEq/L (ref 96–112)
Creatinine, Ser: 0.68 mg/dL (ref 0.40–1.20)
GFR: 92.33 mL/min (ref >60.00)
Glucose, Bld: 107 mg/dL — ABNORMAL HIGH (ref 70–99)
Potassium: 4.6 mEq/L (ref 3.5–5.1)
Sodium: 139 mEq/L (ref 135–145)

## 2023-06-06 LAB — CBC WITH DIFFERENTIAL/PLATELET
Basophils Absolute: 0 10*3/uL (ref 0.0–0.1)
Basophils Relative: 1 % (ref 0.0–3.0)
Eosinophils Absolute: 0.1 10*3/uL (ref 0.0–0.7)
Eosinophils Relative: 2.1 % (ref 0.0–5.0)
HCT: 37.4 % (ref 36.0–46.0)
Hemoglobin: 12.3 g/dL (ref 12.0–15.0)
Lymphocytes Relative: 37.1 % (ref 12.0–46.0)
Lymphs Abs: 1.6 10*3/uL (ref 0.7–4.0)
MCHC: 33 g/dL (ref 30.0–36.0)
MCV: 99 fl (ref 78.0–100.0)
Monocytes Absolute: 0.5 10*3/uL (ref 0.1–1.0)
Monocytes Relative: 11 % (ref 3.0–12.0)
Neutro Abs: 2.1 10*3/uL (ref 1.4–7.7)
Neutrophils Relative %: 48.8 % (ref 43.0–77.0)
Platelets: 235 10*3/uL (ref 150.0–400.0)
RBC: 3.78 Mil/uL — ABNORMAL LOW (ref 3.87–5.11)
RDW: 12.8 % (ref 11.5–15.5)
WBC: 4.4 10*3/uL (ref 4.0–10.5)

## 2023-06-06 LAB — TSH: TSH: 2.27 u[IU]/mL (ref 0.35–5.50)

## 2023-06-06 MED ORDER — PANTOPRAZOLE SODIUM 40 MG PO TBEC
40.0000 mg | DELAYED_RELEASE_TABLET | Freq: Every day | ORAL | 0 refills | Status: DC
  Filled 2023-06-06 (×2): qty 90, 90d supply, fill #0

## 2023-06-06 MED ORDER — OLMESARTAN MEDOXOMIL 20 MG PO TABS
20.0000 mg | ORAL_TABLET | Freq: Every day | ORAL | 0 refills | Status: DC
  Filled 2023-06-06 (×2): qty 90, 90d supply, fill #0

## 2023-06-06 MED ORDER — CARVEDILOL 3.125 MG PO TABS
3.1250 mg | ORAL_TABLET | Freq: Two times a day (BID) | ORAL | 0 refills | Status: DC
  Filled 2023-06-06 (×2): qty 180, 90d supply, fill #0

## 2023-06-06 MED ORDER — PRALUENT 150 MG/ML ~~LOC~~ SOAJ
150.0000 mg | SUBCUTANEOUS | 0 refills | Status: DC
  Filled 2023-06-06 – 2023-08-14 (×3): qty 6, 84d supply, fill #0

## 2023-06-06 MED ORDER — INDAPAMIDE 1.25 MG PO TABS
1.2500 mg | ORAL_TABLET | Freq: Every day | ORAL | 0 refills | Status: DC
  Filled 2023-06-06: qty 90, 90d supply, fill #0

## 2023-06-06 MED ORDER — ROSUVASTATIN CALCIUM 5 MG PO TABS
5.0000 mg | ORAL_TABLET | Freq: Every day | ORAL | 0 refills | Status: DC
  Filled 2023-06-06 (×2): qty 90, 90d supply, fill #0

## 2023-06-06 NOTE — Progress Notes (Signed)
Subjective:  Patient ID: [name] [name], female    DOB: 03/19/1961  Age: 62 y.o. MRN: [id number]  CC: Hypertension   HPI [name] [name] presents for f/up ----   Discussed the use of AI scribe software for clinical note transcription with the patient, who gave verbal consent to proceed.  History of Present Illness   The patient presents with concerns about elevated blood pressure readings during the visit, which are higher than their usual home readings in the mid-120s over mid-80s. They deny any associated symptoms such as headache, blurred vision, chest pain, or shortness of breath. 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  The patient is due for a mammogram, which they have overlooked scheduling, and does not recall having a recent bone density test. They deny any tobacco use but admit to daily alcohol consumption, specifically wine, averaging about three glasses per day. They deny any symptoms of heartburn or indigestion.        Outpatient Medications Prior to Visit  Medication Sig Dispense Refill   Calcium Citrate-Vitamin D 315-250 MG-UNIT TABS Take 1 tablet by mouth 2 (two) times daily.     MULTIPLE VITAMINS-MINERALS PO Take 1 tablet by mouth daily.     Omega-3 Fatty Acids (FISH OIL) 1000 MG CAPS Take 1,000 mg by mouth daily.     PREBIOTIC PRODUCT PO Take 1 capsule by mouth daily.     Turmeric 500 MG CAPS Take 500 mg by mouth 2 (two) times daily.     valACYclovir (VALTREX) 1000 MG tablet Take 1/2 tablet by mouth daily for suppression. Take 2 tablets in the morning & evening for 1 day as needed 90 tablet 4   Alirocumab (PRALUENT) 150 MG/ML SOAJ Inject 150 mg into the skin every 14 (fourteen) days. 6 mL 3   carvedilol (COREG) 3.125 MG tablet Take 1 tablet (3.125 mg total) by mouth 2 (two) times daily with a meal. 190 tablet 1   olmesartan (BENICAR) 20 MG tablet Take 1 tablet (20 mg  total) by mouth daily. 90 tablet 0   pantoprazole (PROTONIX) 40 MG tablet Take 1 tablet (40 mg total) by mouth daily. 90 tablet 1   rosuvastatin (CRESTOR) 5 MG tablet Take 1 tablet (5 mg total) by mouth daily. 90 tablet 3   No facility-administered medications prior to visit.    ROS Review of Systems  Objective:  BP (!) 156/92 (BP Location: Right Arm, Patient Position: Sitting, Cuff Size: Large)   Pulse 72   Temp 98.3 F (36.8 C) (Oral)   Resp 16   Ht 5\' 9"  (1.753 m)   Wt 158 lb (71.7 kg)   SpO2 97%   BMI 23.33 kg/m   BP Readings from Last 3 Encounters:  06/06/23 (!) 156/92  03/10/23 112/76  12/06/22 136/88    Wt Readings from Last 3 Encounters:  06/06/23 158 lb (71.7 kg)  03/10/23 158 lb 12.8 oz (72 kg)  12/06/22 159 lb (72.1 kg)    Physical Exam Cardiovascular:     Rate and Rhythm: Normal rate and regular rhythm.     Heart sounds: Normal heart sounds, S1 normal and S2 normal. No murmur heard.    No gallop.     Comments: EKG- NSR, 66 bpm ?LAE Low voltage TWI in V1 V2 Septal infarct pattern Unchanged Musculoskeletal:     Right lower leg: No edema.  Left lower leg: No edema.     Lab Results  Component Value Date   WBC 4.4 06/06/2023   HGB 12.3 06/06/2023   HCT 37.4 06/06/2023   PLT 235.0 06/06/2023   GLUCOSE 107 (H) 06/06/2023   CHOL 110 03/06/2023   TRIG 127 03/06/2023   HDL 65 03/06/2023   LDLDIRECT 146.0 11/21/2017   LDLCALC 23 03/06/2023   ALT 21 03/06/2023   AST 32 03/06/2023   NA 139 06/06/2023   K 4.6 06/06/2023   CL 102 06/06/2023   CREATININE 0.68 06/06/2023   BUN 22 06/06/2023   CO2 28 06/06/2023   TSH 2.27 06/06/2023   INR 1.1 (H) 11/18/2021   HGBA1C 5.1 12/18/2017   MICROALBUR 2.4 (H) 05/27/2020    US Renal  Result Date: 12/15/2022 CLINICAL DATA:  Decreased renal function.  Hypertension. EXAM: RENAL / URINARY TRACT ULTRASOUND COMPLETE COMPARISON:  None Available. FINDINGS: Right Kidney: Renal measurements: 11.1 x 5.0 x 6.2  cm = volume: 179 mL. Echogenicity within normal limits. No mass or hydronephrosis visualized. Left Kidney: Renal measurements: 11.2 x 5.6 x 5.5 cm = volume: 181 mL. Echogenicity within normal limits. No mass or hydronephrosis visualized. Bladder: Appears normal for degree of bladder distention. Other: None. IMPRESSION: Normal study. No cause for the patient's symptoms identified. Electronically Signed   By: Gerome Sam III M.D.   On: 12/15/2022 16:00    Assessment & Plan:   Primary hypertension- She has not achieved her blood pressure goal.  Will add indapamide to the current regimen. -     EKG 12-Lead -     Aldosterone + renin activity w/ ratio; Future -     Basic metabolic panel; Future -     CBC with Differential/Platelet; Future -     Urinalysis, Routine w reflex microscopic; Future -     TSH; Future -     Carvedilol; Take 1 tablet (3.125 mg total) by mouth 2 (two) times daily with a meal.  Dispense: 180 tablet; Refill: 0 -     Olmesartan Medoxomil; Take 1 tablet (20 mg total) by mouth daily.  Dispense: 90 tablet; Refill: 0 -     Indapamide; Take 1 tablet (1.25 mg total) by mouth daily.  Dispense: 90 tablet; Refill: 0  Estrogen deficiency -     DG Bone Density; Future  Screening mammogram for breast cancer -     3D Screening Mammogram, Left and Right; Future  Gastroesophageal reflux disease without esophagitis -     Pantoprazole Sodium; Take 1 tablet (40 mg total) by mouth daily.  Dispense: 90 tablet; Refill: 0  Dyslipidemia, goal LDL below 70 -     Rosuvastatin Calcium; Take 1 tablet (5 mg total) by mouth daily.  Dispense: 90 tablet; Refill: 0 -     Praluent; Inject 1 mL (150 mg total) into the skin every 14 (fourteen) days.  Dispense: 6 mL; Refill: 0  Agatston coronary artery calcium score less than 100 -     Praluent; Inject 1 mL (150 mg total) into the skin every 14 (fourteen) days.  Dispense: 6 mL; Refill: 0  Chronic renal disease, stage 2, mildly decreased glomerular  filtration rate (GFR) between 60-89 mL/min/1.73 square meter- Her renal function has improved.     Follow-up: Return in about 3 months (around 09/06/2023).  Sanda Linger, MD

## 2023-06-06 NOTE — Patient Instructions (Signed)
Hypertension, Adult High blood pressure (hypertension) is when the force of blood pumping through the arteries is too strong. The arteries are the blood vessels that carry blood from the heart throughout the body. Hypertension forces the heart to work harder to pump blood and may cause arteries to become narrow or stiff. Untreated or uncontrolled hypertension can lead to a heart attack, heart failure, a stroke, kidney disease, and other problems. A blood pressure reading consists of a higher number over a lower number. Ideally, your blood pressure should be below 120/80. The first ("top") number is called the systolic pressure. It is a measure of the pressure in your arteries as your heart beats. The second ("bottom") number is called the diastolic pressure. It is a measure of the pressure in your arteries as the heart relaxes. What are the causes? The exact cause of this condition is not known. There are some conditions that result in high blood pressure. What increases the risk? Certain factors may make you more likely to develop high blood pressure. Some of these risk factors are under your control, including: Smoking. Not getting enough exercise or physical activity. Being overweight. Having too much fat, sugar, calories, or salt (sodium) in your diet. Drinking too much alcohol. Other risk factors include: Having a personal history of heart disease, diabetes, high cholesterol, or kidney disease. Stress. Having a family history of high blood pressure and high cholesterol. Having obstructive sleep apnea. Age. The risk increases with age. What are the signs or symptoms? High blood pressure may not cause symptoms. Very high blood pressure (hypertensive crisis) may cause: Headache. Fast or irregular heartbeats (palpitations). Shortness of breath. Nosebleed. Nausea and vomiting. Vision changes. Severe chest pain, dizziness, and seizures. How is this diagnosed? This condition is diagnosed by  measuring your blood pressure while you are seated, with your arm resting on a flat surface, your legs uncrossed, and your feet flat on the floor. The cuff of the blood pressure monitor will be placed directly against the skin of your upper arm at the level of your heart. Blood pressure should be measured at least twice using the same arm. Certain conditions can cause a difference in blood pressure between your right and left arms. If you have a high blood pressure reading during one visit or you have normal blood pressure with other risk factors, you may be asked to: Return on a different day to have your blood pressure checked again. Monitor your blood pressure at home for 1 week or longer. If you are diagnosed with hypertension, you may have other blood or imaging tests to help your health care provider understand your overall risk for other conditions. How is this treated? This condition is treated by making healthy lifestyle changes, such as eating healthy foods, exercising more, and reducing your alcohol intake. You may be referred for counseling on a healthy diet and physical activity. Your health care provider may prescribe medicine if lifestyle changes are not enough to get your blood pressure under control and if: Your systolic blood pressure is above 130. Your diastolic blood pressure is above 80. Your personal target blood pressure may vary depending on your medical conditions, your age, and other factors. Follow these instructions at home: Eating and drinking  Eat a diet that is high in fiber and potassium, and low in sodium, added sugar, and fat. An example of this eating plan is called the DASH diet. DASH stands for Dietary Approaches to Stop Hypertension. To eat this way: Eat   plenty of fresh fruits and vegetables. Try to fill one half of your plate at each meal with fruits and vegetables. Eat whole grains, such as whole-wheat pasta, brown rice, or whole-grain bread. Fill about one  fourth of your plate with whole grains. Eat or drink low-fat dairy products, such as skim milk or low-fat yogurt. Avoid fatty cuts of meat, processed or cured meats, and poultry with skin. Fill about one fourth of your plate with lean proteins, such as fish, chicken without skin, beans, eggs, or tofu. Avoid pre-made and processed foods. These tend to be higher in sodium, added sugar, and fat. Reduce your daily sodium intake. Many people with hypertension should eat less than 1,500 mg of sodium a day. Do not drink alcohol if: Your health care provider tells you not to drink. You are pregnant, may be pregnant, or are planning to become pregnant. If you drink alcohol: Limit how much you have to: 0-1 drink a day for women. 0-2 drinks a day for men. Know how much alcohol is in your drink. In the [place] one drink equals one 12 oz bottle of beer (355 mL), one 5 oz glass of wine (148 mL), or one 1 oz glass of hard liquor (44 mL). Lifestyle  Work with your health care provider to maintain a healthy body weight or to lose weight. Ask what an ideal weight is for you. Get at least 30 minutes of exercise that causes your heart to beat faster (aerobic exercise) most days of the week. Activities may include walking, swimming, or biking. Include exercise to strengthen your muscles (resistance exercise), such as Pilates or lifting weights, as part of your weekly exercise routine. Try to do these types of exercises for 30 minutes at least 3 days a week. Do not use any products that contain nicotine or tobacco. These products include cigarettes, chewing tobacco, and vaping devices, such as e-cigarettes. If you need help quitting, ask your health care provider. Monitor your blood pressure at home as told by your health care provider. Keep all follow-up visits. This is important. Medicines Take over-the-counter and prescription medicines only as told by your health care provider. Follow directions carefully. Blood  pressure medicines must be taken as prescribed. Do not skip doses of blood pressure medicine. Doing this puts you at risk for problems and can make the medicine less effective. Ask your health care provider about side effects or reactions to medicines that you should watch for. Contact a health care provider if you: Think you are having a reaction to a medicine you are taking. Have headaches that keep coming back (recurring). Feel dizzy. Have swelling in your ankles. Have trouble with your vision. Get help right away if you: Develop a severe headache or confusion. Have unusual weakness or numbness. Feel faint. Have severe pain in your chest or abdomen. Vomit repeatedly. Have trouble breathing. These symptoms may be an emergency. Get help right away. Call 911. Do not wait to see if the symptoms will go away. Do not drive yourself to the hospital. Summary Hypertension is when the force of blood pumping through your arteries is too strong. If this condition is not controlled, it may put you at risk for serious complications. Your personal target blood pressure may vary depending on your medical conditions, your age, and other factors. For most people, a normal blood pressure is less than 120/80. Hypertension is treated with lifestyle changes, medicines, or a combination of both. Lifestyle changes include losing weight, eating a healthy,   low-sodium diet, exercising more, and limiting alcohol. This information is not intended to replace advice given to you by your health care provider. Make sure you discuss any questions you have with your health care provider. Document Revised: 09/07/2021 Document Reviewed: 09/07/2021 Elsevier Patient Education  2024 Elsevier Inc.  

## 2023-06-07 ENCOUNTER — Other Ambulatory Visit (HOSPITAL_COMMUNITY): Payer: Self-pay

## 2023-06-15 DIAGNOSIS — Z419 Encounter for procedure for purposes other than remedying health state, unspecified: Secondary | ICD-10-CM | POA: Diagnosis not present

## 2023-07-16 DIAGNOSIS — Z419 Encounter for procedure for purposes other than remedying health state, unspecified: Secondary | ICD-10-CM | POA: Diagnosis not present

## 2023-07-21 ENCOUNTER — Ambulatory Visit
Admission: RE | Admit: 2023-07-21 | Discharge: 2023-07-21 | Disposition: A | Discharge status: Home / Self Care | Payer: Medicaid Other | Source: Ambulatory Visit | Attending: Internal Medicine | Admitting: Internal Medicine

## 2023-07-21 ENCOUNTER — Other Ambulatory Visit: Payer: Self-pay | Admitting: Internal Medicine

## 2023-07-21 DIAGNOSIS — R931 Abnormal findings on diagnostic imaging of heart and coronary circulation: Secondary | ICD-10-CM

## 2023-07-21 DIAGNOSIS — I10 Essential (primary) hypertension: Secondary | ICD-10-CM

## 2023-07-21 DIAGNOSIS — E785 Hyperlipidemia, unspecified: Secondary | ICD-10-CM

## 2023-07-21 DIAGNOSIS — E2839 Other primary ovarian failure: Secondary | ICD-10-CM

## 2023-07-21 DIAGNOSIS — K219 Gastro-esophageal reflux disease without esophagitis: Secondary | ICD-10-CM

## 2023-07-21 DIAGNOSIS — Z1231 Encounter for screening mammogram for malignant neoplasm of breast: Secondary | ICD-10-CM

## 2023-07-21 DIAGNOSIS — N182 Chronic kidney disease, stage 2 (mild): Secondary | ICD-10-CM

## 2023-07-25 ENCOUNTER — Other Ambulatory Visit: Payer: Self-pay | Admitting: Internal Medicine

## 2023-07-25 DIAGNOSIS — E2839 Other primary ovarian failure: Secondary | ICD-10-CM

## 2023-08-14 ENCOUNTER — Other Ambulatory Visit: Payer: Self-pay | Admitting: Internal Medicine

## 2023-08-14 ENCOUNTER — Other Ambulatory Visit: Payer: Self-pay

## 2023-08-14 ENCOUNTER — Other Ambulatory Visit (HOSPITAL_COMMUNITY): Payer: Self-pay

## 2023-08-14 DIAGNOSIS — I10 Essential (primary) hypertension: Secondary | ICD-10-CM

## 2023-08-14 DIAGNOSIS — E785 Hyperlipidemia, unspecified: Secondary | ICD-10-CM

## 2023-08-14 DIAGNOSIS — K219 Gastro-esophageal reflux disease without esophagitis: Secondary | ICD-10-CM

## 2023-08-14 MED ORDER — ROSUVASTATIN CALCIUM 5 MG PO TABS
5.0000 mg | ORAL_TABLET | Freq: Every day | ORAL | 0 refills | Status: DC
  Filled 2023-08-14: qty 90, 90d supply, fill #0

## 2023-08-14 MED ORDER — OLMESARTAN MEDOXOMIL 20 MG PO TABS
20.0000 mg | ORAL_TABLET | Freq: Every day | ORAL | 0 refills | Status: DC
  Filled 2023-08-14: qty 90, 90d supply, fill #0

## 2023-08-14 MED ORDER — INDAPAMIDE 1.25 MG PO TABS
1.2500 mg | ORAL_TABLET | Freq: Every day | ORAL | 0 refills | Status: DC
  Filled 2023-08-14: qty 90, 90d supply, fill #0

## 2023-08-14 MED ORDER — PANTOPRAZOLE SODIUM 40 MG PO TBEC
40.0000 mg | DELAYED_RELEASE_TABLET | Freq: Every day | ORAL | 0 refills | Status: DC
  Filled 2023-08-14: qty 90, 90d supply, fill #0

## 2023-08-15 ENCOUNTER — Other Ambulatory Visit: Payer: Self-pay

## 2023-08-15 DIAGNOSIS — Z419 Encounter for procedure for purposes other than remedying health state, unspecified: Secondary | ICD-10-CM | POA: Diagnosis not present

## 2023-09-07 ENCOUNTER — Encounter: Payer: Self-pay | Admitting: Internal Medicine

## 2023-09-07 ENCOUNTER — Ambulatory Visit: Payer: Medicaid Other | Admitting: Internal Medicine

## 2023-09-07 ENCOUNTER — Ambulatory Visit: Payer: Medicaid Other

## 2023-09-07 VITALS — BP 136/84 | HR 81 | Temp 97.9°F | Resp 16 | Ht 69.0 in | Wt 158.6 lb

## 2023-09-07 DIAGNOSIS — Z23 Encounter for immunization: Secondary | ICD-10-CM | POA: Diagnosis not present

## 2023-09-07 DIAGNOSIS — N1831 Chronic kidney disease, stage 3a: Secondary | ICD-10-CM | POA: Diagnosis not present

## 2023-09-07 DIAGNOSIS — M25552 Pain in left hip: Secondary | ICD-10-CM

## 2023-09-07 DIAGNOSIS — M160 Bilateral primary osteoarthritis of hip: Secondary | ICD-10-CM | POA: Diagnosis not present

## 2023-09-07 DIAGNOSIS — I10 Essential (primary) hypertension: Secondary | ICD-10-CM | POA: Diagnosis not present

## 2023-09-07 LAB — URINALYSIS, ROUTINE W REFLEX MICROSCOPIC
Bilirubin Urine: NEGATIVE
Hgb urine dipstick: NEGATIVE
Ketones, ur: NEGATIVE
Leukocytes,Ua: NEGATIVE
Nitrite: NEGATIVE
Specific Gravity, Urine: 1.025 (ref 1.000–1.030)
Total Protein, Urine: NEGATIVE
Urine Glucose: NEGATIVE
Urobilinogen, UA: 0.2 (ref 0.0–1.0)
pH: 5.5 (ref 5.0–8.0)

## 2023-09-07 LAB — BASIC METABOLIC PANEL
BUN: 33 mg/dL — ABNORMAL HIGH (ref 6–23)
CO2: 29 meq/L (ref 19–32)
Calcium: 10.7 mg/dL — ABNORMAL HIGH (ref 8.4–10.5)
Chloride: 101 meq/L (ref 96–112)
Creatinine, Ser: 0.86 mg/dL (ref 0.40–1.20)
GFR: 71.49 mL/min (ref >60.00)
Glucose, Bld: 102 mg/dL — ABNORMAL HIGH (ref 70–99)
Potassium: 4.4 meq/L (ref 3.5–5.1)
Sodium: 140 meq/L (ref 135–145)

## 2023-09-07 NOTE — Patient Instructions (Signed)
Hip Pain The hip is the joint between the upper legs and the lower pelvis. The bones, cartilage, tendons, and muscles of your hip joint support your body and allow you to move around. Hip pain can range from a minor ache to severe pain in one or both of your hips. The pain may be felt on the inside of the hip joint near the groin, or on the outside near the buttocks and upper thigh. You may also have swelling or stiffness in your hip area. Follow these instructions at home: Managing pain, stiffness, and swelling     If told, put ice on the painful area. Put ice in a plastic bag. Place a towel between your skin and the bag. Leave the ice on for 20 minutes, 2-3 times a day. If told, apply heat to the affected area as often as told by your health care provider. Use the heat source that your provider recommends, such as a moist heat pack or a heating pad. Place a towel between your skin and the heat source. Leave the heat on for 20-30 minutes. If your skin turns bright red, remove the ice or heat right away to prevent skin damage. The risk of damage is higher if you cannot feel pain, heat, or cold. Activity Do exercises as told by your provider. Avoid activities that cause pain. General instructions  Take over-the-counter and prescription medicines only as told by your provider. Keep a journal of your symptoms. Write down: How often you have hip pain. The location of your pain. What the pain feels like. What makes the pain worse. Sleep with a pillow between your legs on your most comfortable side. Keep all follow-up visits. Your provider will monitor your pain and activity. Contact a health care provider if: You cannot put weight on your leg. Your pain or swelling gets worse after a week. It gets harder to walk. You have a fever. Get help right away if: You fall. You have a sudden increase in pain and swelling in your hip. Your hip is red or swollen or very tender to touch. This  information is not intended to replace advice given to you by your health care provider. Make sure you discuss any questions you have with your health care provider. Document Revised: 07/05/2022 Document Reviewed: 07/05/2022 Elsevier Patient Education  2024 Elsevier Inc.  

## 2023-09-07 NOTE — Progress Notes (Unsigned)
Subjective:  Patient ID: [name] [name], female    DOB: 09-22-1961  Age: 62 y.o. MRN: [id number]  CC: Hypertension   HPI [name] [name] presents for f/up ----  Discussed the use of AI scribe software for clinical note transcription with the patient, who gave verbal consent to proceed.  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 The patient describes the sensation as if the hip would "pop out of place," rendering them unable to walk temporarily. The issue was investigated with an MRI, which revealed no structural abnormalities.  Despite the lack of structural issues, the patient continues to experience problems with the hip. A recent incident occurred while vacuuming, where a sudden turn almost caused a fall due to the hip issue. The patient describes the pain as being associated with the rotation of the leg, particularly outward rotation, and believes it to be related to soft tissue rather than the joint itself.  The patient has not been taking any medication for the pain.   The patient also mentions a change in exercise routine due to seasonal weather changes. They used to walk three to five miles a day on hills during cooler weather, which seemed to alleviate the hip pain. However, with the onset of summer heat, the patient switched to using a rowing machine for 30 to 40 minutes a day, during which they noticed a worsening of the hip pain. The patient denies any chest pain or shortness of breath while exercising.       Outpatient Medications Prior to Visit  Medication Sig Dispense Refill   Alirocumab (PRALUENT) 150 MG/ML SOAJ Inject 1 mL (150 mg total) into the skin every 14 (fourteen) days. 6 mL 0   Calcium Citrate-Vitamin D 315-250 MG-UNIT TABS  Take 1 tablet by mouth 2 (two) times daily.     carvedilol (COREG) 3.125 MG tablet Take 1 tablet (3.125 mg total) by mouth 2 (two) times daily with a meal. 180 tablet 0   indapamide (LOZOL) 1.25 MG tablet Take 1 tablet (1.25 mg total) by mouth daily. 90 tablet 0   MULTIPLE VITAMINS-MINERALS PO Take 1 tablet by mouth daily.     olmesartan (BENICAR) 20 MG tablet Take 1 tablet (20 mg total) by mouth daily. 90 tablet 0   Omega-3 Fatty Acids (FISH OIL) 1000 MG CAPS Take 1,000 mg by mouth daily.     pantoprazole (PROTONIX) 40 MG tablet Take 1 tablet (40 mg total) by mouth daily. 90 tablet 0   PREBIOTIC PRODUCT PO Take 1 capsule by mouth daily.     Probiotic Product (PROBIOTIC PO) Take by mouth daily.     rosuvastatin (CRESTOR) 5 MG tablet Take 1 tablet (5 mg total) by mouth daily. 90 tablet 0   Turmeric 500 MG CAPS Take 500 mg by mouth 2 (two) times daily.     valACYclovir (VALTREX) 1000 MG tablet Take 1/2 tablet by mouth daily for suppression. Take 2 tablets in the morning & evening for 1 day as needed 90 tablet 4   No facility-administered medications prior to visit.    ROS Review of Systems  Objective:  BP 136/84 (BP Location: Left Arm, Patient Position: Sitting, Cuff Size: Normal)   Pulse 81   Temp 97.9 F (36.6 C) (Oral)  Resp 16   Ht 5\' 9"  (1.753 m)   Wt 158 lb 9.6 oz (71.9 kg)   SpO2 95%   BMI 23.42 kg/m   BP Readings from Last 3 Encounters:  09/07/23 136/84  06/06/23 (!) 156/92  03/10/23 112/76    Wt Readings from Last 3 Encounters:  09/07/23 158 lb 9.6 oz (71.9 kg)  06/06/23 158 lb (71.7 kg)  03/10/23 158 lb 12.8 oz (72 kg)    Physical Exam  Lab Results  Component Value Date   WBC 4.4 06/06/2023   HGB 12.3 06/06/2023   HCT 37.4 06/06/2023   PLT 235.0 06/06/2023   GLUCOSE 107 (H) 06/06/2023   CHOL 110 03/06/2023   TRIG 127 03/06/2023   HDL 65 03/06/2023   LDLDIRECT 146.0 11/21/2017   LDLCALC 23 03/06/2023   ALT 21 03/06/2023   AST 32 03/06/2023   NA 139  06/06/2023   K 4.6 06/06/2023   CL 102 06/06/2023   CREATININE 0.68 06/06/2023   BUN 22 06/06/2023   CO2 28 06/06/2023   TSH 2.27 06/06/2023   INR 1.1 (H) 11/18/2021   HGBA1C 5.1 12/18/2017   MICROALBUR 2.4 (H) 05/27/2020    MM 3D SCREEN BREAST W/IMPLANT BILATERAL  Result Date: 07/24/2023 CLINICAL DATA:  Screening. EXAM: DIGITAL SCREENING BILATERAL MAMMOGRAM WITH IMPLANTS, CAD AND TOMOSYNTHESIS TECHNIQUE: Bilateral screening digital craniocaudal and mediolateral oblique mammograms were obtained. Bilateral screening digital breast tomosynthesis was performed. The images were evaluated with computer-aided detection. Standard and/or implant displaced views were performed. COMPARISON:  Previous exam(s). ACR Breast Density Category b: There are scattered areas of fibroglandular density. FINDINGS: The patient has retroglandular implants. There are no findings suspicious for malignancy. IMPRESSION: No mammographic evidence of malignancy. A result letter of this screening mammogram will be mailed directly to the patient. RECOMMENDATION: Screening mammogram in one year. (Code:SM-B-01Y) BI-RADS CATEGORY  1: Negative. Electronically Signed   By: Baird Lyons M.D.   On: 07/24/2023 14:19   DG HIP UNILAT WITH PELVIS 2-3 VIEWS LEFT  Result Date: 09/07/2023 CLINICAL DATA:  Left hip pain for 4 years EXAM: DG HIP (WITH OR WITHOUT PELVIS) 2-3V LEFT COMPARISON:  Hip radiographs 12/08/2020 FINDINGS: There is no acute osseous finding in the pelvis. Femoroacetabular alignment is normal bilaterally. There is a minimal femoroacetabular joint space narrowing bilaterally. The SI joints and symphysis pubis are intact. There is no erosive change. The soft tissues are unremarkable. IMPRESSION: Minimal degenerative changes about the hips.  No acute finding. Electronically Signed   By: Lesia Hausen M.D.   On: 09/07/2023 11:21     Assessment & Plan:  Stage 3a chronic kidney disease (HCC) -     Basic metabolic panel; Future -      Urinalysis, Routine w reflex microscopic; Future  Primary hypertension -     Basic metabolic panel; Future -     Urinalysis, Routine w reflex microscopic; Future  Need for immunization against influenza -     Flu vaccine trivalent PF, 6mos and older(Flulaval,Afluria,Fluarix,Fluzone)  Acute hip pain, left -     DG HIP UNILAT W OR W/O PELVIS MIN 4 VIEWS LEFT; Future     Follow-up: Return in about 4 months (around 01/08/2024).  Sanda Linger, MD

## 2023-09-14 ENCOUNTER — Other Ambulatory Visit (HOSPITAL_COMMUNITY): Payer: Self-pay

## 2023-09-14 ENCOUNTER — Other Ambulatory Visit: Payer: Self-pay

## 2023-09-14 DIAGNOSIS — Z0000 Encounter for general adult medical examination without abnormal findings: Secondary | ICD-10-CM | POA: Diagnosis not present

## 2023-09-14 DIAGNOSIS — N9089 Other specified noninflammatory disorders of vulva and perineum: Secondary | ICD-10-CM | POA: Diagnosis not present

## 2023-09-14 DIAGNOSIS — B009 Herpesviral infection, unspecified: Secondary | ICD-10-CM | POA: Diagnosis not present

## 2023-09-14 DIAGNOSIS — Z90710 Acquired absence of both cervix and uterus: Secondary | ICD-10-CM | POA: Diagnosis not present

## 2023-09-14 MED ORDER — VALACYCLOVIR HCL 1 G PO TABS
500.0000 mg | ORAL_TABLET | Freq: Every day | ORAL | 4 refills | Status: AC
  Filled 2023-09-14: qty 90, 30d supply, fill #0
  Filled 2023-11-06: qty 90, 30d supply, fill #1
  Filled 2024-01-31: qty 90, 30d supply, fill #2
  Filled 2024-05-20: qty 90, 30d supply, fill #3
  Filled 2024-06-10 – 2024-08-21 (×2): qty 90, 30d supply, fill #4

## 2023-09-15 DIAGNOSIS — Z419 Encounter for procedure for purposes other than remedying health state, unspecified: Secondary | ICD-10-CM | POA: Diagnosis not present

## 2023-09-17 ENCOUNTER — Other Ambulatory Visit: Payer: Self-pay | Admitting: Internal Medicine

## 2023-09-17 DIAGNOSIS — I10 Essential (primary) hypertension: Secondary | ICD-10-CM

## 2023-09-17 MED ORDER — CARVEDILOL 3.125 MG PO TABS
3.1250 mg | ORAL_TABLET | Freq: Two times a day (BID) | ORAL | 0 refills | Status: DC
  Filled 2023-09-17: qty 180, 90d supply, fill #0

## 2023-09-18 ENCOUNTER — Other Ambulatory Visit (HOSPITAL_COMMUNITY): Payer: Self-pay

## 2023-09-18 ENCOUNTER — Other Ambulatory Visit: Payer: Self-pay

## 2023-10-15 DIAGNOSIS — Z419 Encounter for procedure for purposes other than remedying health state, unspecified: Secondary | ICD-10-CM | POA: Diagnosis not present

## 2023-11-06 ENCOUNTER — Other Ambulatory Visit: Payer: Self-pay | Admitting: Internal Medicine

## 2023-11-06 ENCOUNTER — Other Ambulatory Visit: Payer: Self-pay

## 2023-11-06 DIAGNOSIS — R931 Abnormal findings on diagnostic imaging of heart and coronary circulation: Secondary | ICD-10-CM

## 2023-11-06 DIAGNOSIS — E785 Hyperlipidemia, unspecified: Secondary | ICD-10-CM

## 2023-11-07 ENCOUNTER — Other Ambulatory Visit: Payer: Self-pay

## 2023-11-07 MED ORDER — PRALUENT 150 MG/ML ~~LOC~~ SOAJ
150.0000 mg | SUBCUTANEOUS | 0 refills | Status: DC
  Filled 2023-11-07: qty 6, 84d supply, fill #0

## 2023-11-15 DIAGNOSIS — Z419 Encounter for procedure for purposes other than remedying health state, unspecified: Secondary | ICD-10-CM | POA: Diagnosis not present

## 2023-11-26 ENCOUNTER — Other Ambulatory Visit: Payer: Self-pay | Admitting: Internal Medicine

## 2023-11-26 DIAGNOSIS — E785 Hyperlipidemia, unspecified: Secondary | ICD-10-CM

## 2023-11-26 DIAGNOSIS — K219 Gastro-esophageal reflux disease without esophagitis: Secondary | ICD-10-CM

## 2023-11-26 DIAGNOSIS — I10 Essential (primary) hypertension: Secondary | ICD-10-CM

## 2023-11-26 MED ORDER — ROSUVASTATIN CALCIUM 5 MG PO TABS
5.0000 mg | ORAL_TABLET | Freq: Every day | ORAL | 0 refills | Status: DC
  Filled 2023-11-26: qty 90, 90d supply, fill #0

## 2023-11-26 MED ORDER — OLMESARTAN MEDOXOMIL 20 MG PO TABS
20.0000 mg | ORAL_TABLET | Freq: Every day | ORAL | 0 refills | Status: DC
  Filled 2023-11-26: qty 90, 90d supply, fill #0

## 2023-11-26 MED ORDER — INDAPAMIDE 1.25 MG PO TABS
1.2500 mg | ORAL_TABLET | Freq: Every day | ORAL | 0 refills | Status: DC
  Filled 2023-11-26: qty 90, 90d supply, fill #0

## 2023-11-26 MED ORDER — PANTOPRAZOLE SODIUM 40 MG PO TBEC
40.0000 mg | DELAYED_RELEASE_TABLET | Freq: Every day | ORAL | 0 refills | Status: DC
  Filled 2023-11-26: qty 90, 90d supply, fill #0

## 2023-11-27 ENCOUNTER — Other Ambulatory Visit: Payer: Self-pay

## 2023-11-27 ENCOUNTER — Other Ambulatory Visit (HOSPITAL_COMMUNITY): Payer: Self-pay

## 2023-12-13 ENCOUNTER — Other Ambulatory Visit: Payer: Self-pay | Admitting: Internal Medicine

## 2023-12-13 DIAGNOSIS — I10 Essential (primary) hypertension: Secondary | ICD-10-CM

## 2023-12-13 MED ORDER — CARVEDILOL 3.125 MG PO TABS
3.1250 mg | ORAL_TABLET | Freq: Two times a day (BID) | ORAL | 0 refills | Status: DC
  Filled 2023-12-13: qty 180, 90d supply, fill #0

## 2023-12-14 ENCOUNTER — Other Ambulatory Visit: Payer: Self-pay

## 2023-12-16 DIAGNOSIS — Z419 Encounter for procedure for purposes other than remedying health state, unspecified: Secondary | ICD-10-CM | POA: Diagnosis not present

## 2024-01-08 ENCOUNTER — Ambulatory Visit: Payer: Medicaid Other | Admitting: Internal Medicine

## 2024-01-13 DIAGNOSIS — Z419 Encounter for procedure for purposes other than remedying health state, unspecified: Secondary | ICD-10-CM | POA: Diagnosis not present

## 2024-01-31 ENCOUNTER — Ambulatory Visit
Admission: RE | Admit: 2024-01-31 | Discharge: 2024-01-31 | Disposition: A | Discharge status: Home / Self Care | Payer: Medicaid Other | Source: Ambulatory Visit | Attending: Internal Medicine | Admitting: Internal Medicine

## 2024-01-31 ENCOUNTER — Other Ambulatory Visit: Payer: Self-pay | Admitting: Internal Medicine

## 2024-01-31 ENCOUNTER — Other Ambulatory Visit (HOSPITAL_COMMUNITY): Payer: Self-pay

## 2024-01-31 DIAGNOSIS — N958 Other specified menopausal and perimenopausal disorders: Secondary | ICD-10-CM | POA: Diagnosis not present

## 2024-01-31 DIAGNOSIS — E2839 Other primary ovarian failure: Secondary | ICD-10-CM

## 2024-01-31 DIAGNOSIS — R931 Abnormal findings on diagnostic imaging of heart and coronary circulation: Secondary | ICD-10-CM

## 2024-01-31 DIAGNOSIS — M8588 Other specified disorders of bone density and structure, other site: Secondary | ICD-10-CM | POA: Diagnosis not present

## 2024-01-31 DIAGNOSIS — E785 Hyperlipidemia, unspecified: Secondary | ICD-10-CM

## 2024-01-31 MED ORDER — PRALUENT 150 MG/ML ~~LOC~~ SOAJ
150.0000 mg | SUBCUTANEOUS | 0 refills | Status: DC
  Filled 2024-01-31: qty 2, 28d supply, fill #0

## 2024-02-05 ENCOUNTER — Telehealth: Payer: Self-pay | Admitting: Pharmacy Technician

## 2024-02-05 ENCOUNTER — Other Ambulatory Visit (HOSPITAL_COMMUNITY): Payer: Self-pay

## 2024-02-05 NOTE — Telephone Encounter (Signed)
 Pharmacy Patient Advocate Encounter  Received notification from [facility] Medicaid that Prior Authorization for Praluent 150MG /ML auto-injectors has been APPROVED from 02/05/2024 to 02/04/2025. Ran test claim, Copay is $4.00. This test claim was processed through [facility]- copay amounts may vary at other pharmacies due to pharmacy/plan contracts, or as the patient moves through the different stages of their insurance plan.   PA #/Case ID/Reference #: [id number]

## 2024-02-05 NOTE — Telephone Encounter (Signed)
 Pharmacy Patient Advocate Encounter   Received notification from CoverMyMeds that prior authorization for Praluent 150MG /ML auto-injectors is required/requested.   Insurance verification completed.   The patient is insured through [facility] [place] .   Per test claim: PA required; PA submitted to above mentioned insurance via CoverMyMeds Key/confirmation #/EOC [id number] Status is pending

## 2024-02-06 ENCOUNTER — Other Ambulatory Visit (HOSPITAL_COMMUNITY): Payer: Self-pay

## 2024-02-12 ENCOUNTER — Ambulatory Visit (INDEPENDENT_AMBULATORY_CARE_PROVIDER_SITE_OTHER): Payer: Medicaid Other | Admitting: Internal Medicine

## 2024-02-12 ENCOUNTER — Encounter: Payer: Self-pay | Admitting: Internal Medicine

## 2024-02-12 VITALS — BP 132/80 | HR 66 | Temp 98.6°F | Resp 16 | Ht 69.0 in | Wt 161.0 lb

## 2024-02-12 DIAGNOSIS — K760 Fatty (change of) liver, not elsewhere classified: Secondary | ICD-10-CM | POA: Diagnosis not present

## 2024-02-12 DIAGNOSIS — R339 Retention of urine, unspecified: Secondary | ICD-10-CM | POA: Insufficient documentation

## 2024-02-12 DIAGNOSIS — D539 Nutritional anemia, unspecified: Secondary | ICD-10-CM | POA: Diagnosis not present

## 2024-02-12 DIAGNOSIS — Z23 Encounter for immunization: Secondary | ICD-10-CM | POA: Diagnosis not present

## 2024-02-12 DIAGNOSIS — I10 Essential (primary) hypertension: Secondary | ICD-10-CM

## 2024-02-12 DIAGNOSIS — E785 Hyperlipidemia, unspecified: Secondary | ICD-10-CM

## 2024-02-12 LAB — LIPID PANEL
Cholesterol: 117 mg/dL (ref 0–200)
HDL: 80.4 mg/dL (ref >39.00)
LDL Cholesterol: 11 mg/dL (ref 0–99)
NonHDL: 36.59
Total CHOL/HDL Ratio: 1
Triglycerides: 129 mg/dL (ref 0.0–149.0)
VLDL: 25.8 mg/dL (ref 0.0–40.0)

## 2024-02-12 LAB — BASIC METABOLIC PANEL WITH GFR
BUN: 21 mg/dL (ref 6–23)
CO2: 30 meq/L (ref 19–32)
Calcium: 9.9 mg/dL (ref 8.4–10.5)
Chloride: 100 meq/L (ref 96–112)
Creatinine, Ser: 0.85 mg/dL (ref 0.40–1.20)
GFR: 72.28 mL/min (ref >60.00)
Glucose, Bld: 105 mg/dL — ABNORMAL HIGH (ref 70–99)
Potassium: 4.6 meq/L (ref 3.5–5.1)
Sodium: 139 meq/L (ref 135–145)

## 2024-02-12 LAB — CBC WITH DIFFERENTIAL/PLATELET
Basophils Absolute: 0.1 10*3/uL (ref 0.0–0.1)
Basophils Relative: 0.9 % (ref 0.0–3.0)
Eosinophils Absolute: 0.1 10*3/uL (ref 0.0–0.7)
Eosinophils Relative: 1.6 % (ref 0.0–5.0)
HCT: 35.2 % — ABNORMAL LOW (ref 36.0–46.0)
Hemoglobin: 11.9 g/dL — ABNORMAL LOW (ref 12.0–15.0)
Lymphocytes Relative: 32 % (ref 12.0–46.0)
Lymphs Abs: 2 10*3/uL (ref 0.7–4.0)
MCHC: 33.8 g/dL (ref 30.0–36.0)
MCV: 97.8 fl (ref 78.0–100.0)
Monocytes Absolute: 0.5 10*3/uL (ref 0.1–1.0)
Monocytes Relative: 8.3 % (ref 3.0–12.0)
Neutro Abs: 3.6 10*3/uL (ref 1.4–7.7)
Neutrophils Relative %: 57.2 % (ref 43.0–77.0)
Platelets: 265 10*3/uL (ref 150.0–400.0)
RBC: 3.6 Mil/uL — ABNORMAL LOW (ref 3.87–5.11)
RDW: 12.4 % (ref 11.5–15.5)
WBC: 6.3 10*3/uL (ref 4.0–10.5)

## 2024-02-12 LAB — URINALYSIS, ROUTINE W REFLEX MICROSCOPIC
Bilirubin Urine: NEGATIVE
Hgb urine dipstick: NEGATIVE
Ketones, ur: NEGATIVE
Leukocytes,Ua: NEGATIVE
Nitrite: NEGATIVE
RBC / HPF: NONE SEEN (ref 0–?)
Specific Gravity, Urine: 1.015 (ref 1.000–1.030)
Total Protein, Urine: NEGATIVE
Urine Glucose: NEGATIVE
Urobilinogen, UA: 1 (ref 0.0–1.0)
pH: 7.5 (ref 5.0–8.0)

## 2024-02-12 LAB — HEPATIC FUNCTION PANEL
ALT: 16 U/L (ref 0–35)
AST: 27 U/L (ref 0–37)
Albumin: 4.9 g/dL (ref 3.5–5.2)
Alkaline Phosphatase: 56 U/L (ref 39–117)
Bilirubin, Direct: 0.1 mg/dL (ref 0.0–0.3)
Total Bilirubin: 0.5 mg/dL (ref 0.2–1.2)
Total Protein: 7.9 g/dL (ref 6.0–8.3)

## 2024-02-12 LAB — PROTIME-INR
INR: 1.2 ratio — ABNORMAL HIGH (ref 0.8–1.0)
Prothrombin Time: 12.5 s (ref 9.6–13.1)

## 2024-02-12 LAB — TSH: TSH: 2.23 u[IU]/mL (ref 0.35–5.50)

## 2024-02-12 NOTE — Patient Instructions (Signed)
 Hypertension, Adult High blood pressure (hypertension) is when the force of blood pumping through the arteries is too strong. The arteries are the blood vessels that carry blood from the heart throughout the body. Hypertension forces the heart to work harder to pump blood and may cause arteries to become narrow or stiff. Untreated or uncontrolled hypertension can lead to a heart attack, heart failure, a stroke, kidney disease, and other problems. A blood pressure reading consists of a higher number over a lower number. Ideally, your blood pressure should be below 120/80. The first ("top") number is called the systolic pressure. It is a measure of the pressure in your arteries as your heart beats. The second ("bottom") number is called the diastolic pressure. It is a measure of the pressure in your arteries as the heart relaxes. What are the causes? The exact cause of this condition is not known. There are some conditions that result in high blood pressure. What increases the risk? Certain factors may make you more likely to develop high blood pressure. Some of these risk factors are under your control, including: Smoking. Not getting enough exercise or physical activity. Being overweight. Having too much fat, sugar, calories, or salt (sodium) in your diet. Drinking too much alcohol. Other risk factors include: Having a personal history of heart disease, diabetes, high cholesterol, or kidney disease. Stress. Having a family history of high blood pressure and high cholesterol. Having obstructive sleep apnea. Age. The risk increases with age. What are the signs or symptoms? High blood pressure may not cause symptoms. Very high blood pressure (hypertensive crisis) may cause: Headache. Fast or irregular heartbeats (palpitations). Shortness of breath. Nosebleed. Nausea and vomiting. Vision changes. Severe chest pain, dizziness, and seizures. How is this diagnosed? This condition is diagnosed by  measuring your blood pressure while you are seated, with your arm resting on a flat surface, your legs uncrossed, and your feet flat on the floor. The cuff of the blood pressure monitor will be placed directly against the skin of your upper arm at the level of your heart. Blood pressure should be measured at least twice using the same arm. Certain conditions can cause a difference in blood pressure between your right and left arms. If you have a high blood pressure reading during one visit or you have normal blood pressure with other risk factors, you may be asked to: Return on a different day to have your blood pressure checked again. Monitor your blood pressure at home for 1 week or longer. If you are diagnosed with hypertension, you may have other blood or imaging tests to help your health care provider understand your overall risk for other conditions. How is this treated? This condition is treated by making healthy lifestyle changes, such as eating healthy foods, exercising more, and reducing your alcohol intake. You may be referred for counseling on a healthy diet and physical activity. Your health care provider may prescribe medicine if lifestyle changes are not enough to get your blood pressure under control and if: Your systolic blood pressure is above 130. Your diastolic blood pressure is above 80. Your personal target blood pressure may vary depending on your medical conditions, your age, and other factors. Follow these instructions at home: Eating and drinking  Eat a diet that is high in fiber and potassium, and low in sodium, added sugar, and fat. An example of this eating plan is called the DASH diet. DASH stands for Dietary Approaches to Stop Hypertension. To eat this way: Eat  plenty of fresh fruits and vegetables. Try to fill one half of your plate at each meal with fruits and vegetables. Eat whole grains, such as whole-wheat pasta, brown rice, or whole-grain bread. Fill about one  fourth of your plate with whole grains. Eat or drink low-fat dairy products, such as skim milk or low-fat yogurt. Avoid fatty cuts of meat, processed or cured meats, and poultry with skin. Fill about one fourth of your plate with lean proteins, such as fish, chicken without skin, beans, eggs, or tofu. Avoid pre-made and processed foods. These tend to be higher in sodium, added sugar, and fat. Reduce your daily sodium intake. Many people with hypertension should eat less than 1,500 mg of sodium a day. Do not drink alcohol if: Your health care provider tells you not to drink. You are pregnant, may be pregnant, or are planning to become pregnant. If you drink alcohol: Limit how much you have to: 0-1 drink a day for women. 0-2 drinks a day for men. Know how much alcohol is in your drink. In the [place] one drink equals one 12 oz bottle of beer (355 mL), one 5 oz glass of wine (148 mL), or one 1 oz glass of hard liquor (44 mL). Lifestyle  Work with your health care provider to maintain a healthy body weight or to lose weight. Ask what an ideal weight is for you. Get at least 30 minutes of exercise that causes your heart to beat faster (aerobic exercise) most days of the week. Activities may include walking, swimming, or biking. Include exercise to strengthen your muscles (resistance exercise), such as Pilates or lifting weights, as part of your weekly exercise routine. Try to do these types of exercises for 30 minutes at least 3 days a week. Do not use any products that contain nicotine or tobacco. These products include cigarettes, chewing tobacco, and vaping devices, such as e-cigarettes. If you need help quitting, ask your health care provider. Monitor your blood pressure at home as told by your health care provider. Keep all follow-up visits. This is important. Medicines Take over-the-counter and prescription medicines only as told by your health care provider. Follow directions carefully. Blood  pressure medicines must be taken as prescribed. Do not skip doses of blood pressure medicine. Doing this puts you at risk for problems and can make the medicine less effective. Ask your health care provider about side effects or reactions to medicines that you should watch for. Contact a health care provider if you: Think you are having a reaction to a medicine you are taking. Have headaches that keep coming back (recurring). Feel dizzy. Have swelling in your ankles. Have trouble with your vision. Get help right away if you: Develop a severe headache or confusion. Have unusual weakness or numbness. Feel faint. Have severe pain in your chest or abdomen. Vomit repeatedly. Have trouble breathing. These symptoms may be an emergency. Get help right away. Call 911. Do not wait to see if the symptoms will go away. Do not drive yourself to the hospital. Summary Hypertension is when the force of blood pumping through your arteries is too strong. If this condition is not controlled, it may put you at risk for serious complications. Your personal target blood pressure may vary depending on your medical conditions, your age, and other factors. For most people, a normal blood pressure is less than 120/80. Hypertension is treated with lifestyle changes, medicines, or a combination of both. Lifestyle changes include losing weight, eating a healthy,  low-sodium diet, exercising more, and limiting alcohol. This information is not intended to replace advice given to you by your health care provider. Make sure you discuss any questions you have with your health care provider. Document Revised: 09/07/2021 Document Reviewed: 09/07/2021 Elsevier Patient Education  2024 [employer].

## 2024-02-12 NOTE — Progress Notes (Unsigned)
 Subjective:  Patient ID: [name] [name], female    DOB: 06-20-1961  Age: 63 y.o. MRN: [id number]  CC: Hypertension, Hyperlipidemia, and Anemia   HPI [name] [name] presents for f/up ----  Discussed the use of AI scribe software for clinical note transcription with the patient, who gave verbal consent to proceed.  History of Present Illness   [name] [name] is a 63 year old female who presents with symptoms of incomplete bladder emptying.  She has been experiencing symptoms of incomplete bladder emptying for approximately six weeks. There is a sensation of the bladder not emptying completely after urination, occurring frequently, including at night and in the morning. She often needs to urinate again within 15 to 20 minutes after initially voiding. The sensation is unusual for her and has been progressively worsening, causing discomfort. No pain during urination, hematuria, or symptoms suggestive of kidney stones. There is no history of urinary incontinence unless urination is delayed when there is a strong urge. Her mother had a history of similar bladder emptying issues, though it is unclear if medical evaluation was sought.  No chest pain, shortness of breath, dizziness, lightheadedness, palpitations, pain during urination, hematuria, or symptoms suggestive of kidney stones.  She engages in walking for over an hour at least five days a week, which she enjoys and feels great doing, although she sometimes experiences discomfort in her hips and back. She does not take any medication for pain, citing a high tolerance for it.       Outpatient Medications Prior to Visit  Medication Sig Dispense Refill   Alirocumab (PRALUENT) 150 MG/ML SOAJ Inject 1 mL (150 mg total) into the skin every 14 (fourteen) days. 6 mL 0   Calcium Citrate-Vitamin D 315-250 MG-UNIT TABS Take 1 tablet by mouth 2 (two) times daily.     carvedilol (COREG) 3.125 MG tablet Take 1 tablet (3.125 mg total) by mouth 2 (two) times  daily with a meal. 180 tablet 0   indapamide (LOZOL) 1.25 MG tablet Take 1 tablet (1.25 mg total) by mouth daily. 90 tablet 0   MULTIPLE VITAMINS-MINERALS PO Take 1 tablet by mouth daily.     olmesartan (BENICAR) 20 MG tablet Take 1 tablet (20 mg total) by mouth daily. 90 tablet 0   Omega-3 Fatty Acids (FISH OIL) 1000 MG CAPS Take 1,000 mg by mouth daily.     pantoprazole (PROTONIX) 40 MG tablet Take 1 tablet (40 mg total) by mouth daily. 90 tablet 0   Probiotic Product (PROBIOTIC PO) Take by mouth daily.     rosuvastatin (CRESTOR) 5 MG tablet Take 1 tablet (5 mg total) by mouth daily. 90 tablet 0   Turmeric 500 MG CAPS Take 500 mg by mouth 2 (two) times daily.     valACYclovir (VALTREX) 1000 MG tablet Take 1/2 tablet by mouth daily for suppression. Take 2 tablets in the morning & evening for 1 day as needed 90 tablet 4   valACYclovir (VALTREX) 1000 MG tablet Take 0.5 tablets (500 mg total) by mouth daily for supression. May take 2 tablets every morning and evening for 1 day as needed 90 tablet 4   PREBIOTIC PRODUCT PO Take 1 capsule by mouth daily.     No facility-administered medications prior to visit.    ROS Review of Systems  Constitutional: Negative.  Negative for appetite change, diaphoresis, fatigue and unexpected weight change.  HENT: Negative.    Respiratory: Negative.  Negative for chest tightness, shortness of breath and  wheezing.   Cardiovascular:  Negative for chest pain, palpitations and leg swelling.  Gastrointestinal:  Negative for abdominal pain, constipation, diarrhea, nausea and vomiting.  Genitourinary:  Positive for difficulty urinating. Negative for dysuria.  Musculoskeletal:  Positive for arthralgias. Negative for myalgias.  Skin: Negative.   Neurological:  Negative for dizziness, weakness and light-headedness.  Hematological:  Negative for adenopathy. Does not bruise/bleed easily.  Psychiatric/Behavioral: Negative.      Objective:  BP 132/80 (BP Location:  Right Arm, Patient Position: Sitting, Cuff Size: Normal)   Pulse 66   Temp 98.6 F (37 C) (Oral)   Resp 16   Ht 5\' 9"  (1.753 m)   Wt 161 lb (73 kg)   SpO2 98%   BMI 23.78 kg/m   BP Readings from Last 3 Encounters:  02/12/24 132/80  09/07/23 136/84  06/06/23 (!) 156/92    Wt Readings from Last 3 Encounters:  02/12/24 161 lb (73 kg)  09/07/23 158 lb 9.6 oz (71.9 kg)  06/06/23 158 lb (71.7 kg)    Physical Exam Vitals reviewed.  Constitutional:      Appearance: Normal appearance.  Eyes:     General: No scleral icterus.    Pupils: Pupils are equal, round, and reactive to light.  Cardiovascular:     Rate and Rhythm: Normal rate and regular rhythm.     Heart sounds: No murmur heard.    No friction rub. No gallop.     Comments: EKG-- NSR, 65 bpm Low voltage No LVH, Q waves, or ST/T wave changes  Unchanged  Pulmonary:     Effort: Pulmonary effort is normal.     Breath sounds: No stridor. No wheezing, rhonchi or rales.  Abdominal:     General: Abdomen is protuberant. Bowel sounds are normal. There is no distension.     Palpations: Abdomen is soft. There is no hepatomegaly, splenomegaly or mass.     Tenderness: There is no abdominal tenderness.  Musculoskeletal:     Cervical back: Neck supple.     Right lower leg: No edema.     Left lower leg: No edema.  Lymphadenopathy:     Cervical: No cervical adenopathy.  Skin:    General: Skin is warm.     Findings: No rash.  Neurological:     General: No focal deficit present.     Mental Status: She is alert. Mental status is at baseline.  Psychiatric:        Mood and Affect: Mood normal.     Lab Results  Component Value Date   WBC 6.3 02/12/2024   HGB 11.9 (L) 02/12/2024   HCT 35.2 (L) 02/12/2024   PLT 265.0 02/12/2024   GLUCOSE 105 (H) 02/12/2024   CHOL 117 02/12/2024   TRIG 129.0 02/12/2024   HDL 80.40 02/12/2024   LDLDIRECT 146.0 11/21/2017   LDLCALC 11 02/12/2024   ALT 16 02/12/2024   AST 27 02/12/2024    NA 139 02/12/2024   K 4.6 02/12/2024   CL 100 02/12/2024   CREATININE 0.85 02/12/2024   BUN 21 02/12/2024   CO2 30 02/12/2024   TSH 2.23 02/12/2024   INR 1.2 (H) 02/12/2024   HGBA1C 5.1 12/18/2017   MICROALBUR 2.4 (H) 05/27/2020    DG BONE DENSITY (DXA) Result Date: 01/31/2024 EXAM: DUAL X-RAY ABSORPTIOMETRY (DXA) FOR BONE MINERAL DENSITY IMPRESSION: Referring Physician:  Etta Grandchild Your patient completed a bone mineral density test using GE Lunar iDXA system (analysis version: 16). Technologist: KAT PATIENT: Name: Selbe,  Hoorain Kozakiewicz Patient ID: [id number] Birth Date: 08/15/1961 Height: 67.5 in. Sex: Female Measured: 01/31/2024 Weight: 158.6 lbs. Indications: Alcohol (3 or more units per day), Caucasian, Estrogen Deficient, Height Loss (781.91), History of Fracture (Adult) (V15.51), Hysterectomy, Postmenopausal, Protonix, Secondary Osteoporosis Fractures: Right Elbow, Right Foot Treatments: Calcium (E943.0) ASSESSMENT: The BMD measured at Forearm Radius 33% is 0.769 g/cm2 with a T-score of -1.3. This patient is considered osteopenic/low bone mass according to World Health Organization [facility]) criteria. The quality of the exam is good. The lumbar spine was excluded due to degenerative changes and previous surgery. Site Region Measured Date Measured Age YA BMD Significant CHANGE T-score Left Forearm Radius 33% 01/31/2024 64.6 -1.3 0.769 g/cm2 DualFemur Neck Right 01/31/2024 64.6 -1.1 0.881 g/cm2 DualFemur Total Mean 01/31/2024 64.6 -0.6 0.932 g/cm2 World Health Organization [facility]) criteria for post-menopausal, Caucasian Women: Normal       T-score at or above -1 SD Osteopenia   T-score between -1 and -2.5 SD Osteoporosis T-score at or below -2.5 SD RECOMMENDATION: 1. All patients should optimize calcium and vitamin D intake. 2. Consider FDA-approved medical therapies in postmenopausal women and men aged 48 years and older, based on the following: a. A hip or vertebral (clinical or morphometric) fracture. b.  T-score = -2.5 at the femoral neck or spine after appropriate evaluation to exclude secondary causes. c. Low bone mass (T-score between -1.0 and -2.5 at the femoral neck or spine) and a 10-year probability of a hip fracture = 3% or a 10-year probability of a major osteoporosis-related fracture = 20% based on the [place]-adapted WHO algorithm. d. Clinician judgment and/or patient preferences may indicate treatment for people with 10-year fracture probabilities above or below these levels. FOLLOW-UP: Patients with diagnosis of osteoporosis or at high risk for fracture should have regular bone mineral density tests.? Patients eligible for Medicare are allowed routine testing every 2 years.? The testing frequency can be increased to one year for patients who have rapidly progressing disease, are receiving or discontinuing medical therapy to restore bone mass, or have additional risk factors. I have reviewed this study and agree with the findings. [facility] FRAX* 10-year Probability of Fracture Based on femoral neck BMD: DualFemur (Right) Major Osteoporotic Fracture: 16.0% Hip Fracture:                1.6% Population:                  [place] (Caucasian) Risk Factors: Alcohol (3 or more units per day), History of Fracture (Adult) (V15.51), Secondary Osteoporosis *FRAX is a Armed forces logistics/support/administrative officer of the [employer] of [employer] for Metabolic Bone Disease, a World Science writer (WHO) [employer]. ASSESSMENT: The probability of a major osteoporotic fracture is 16.0 % within the next ten years. The probability of a hip fracture is 1.6 % within the next ten years. Electronically Signed   By: Baird Lyons M.D.   On: 01/31/2024 12:31    Assessment & Plan:  Incomplete bladder emptying -     Ambulatory referral to Urology -     Urinalysis, Routine w reflex microscopic; Future  NAFLD (nonalcoholic fatty liver disease) -     Hepatic function panel; Future -     Protime-INR;  Future  Dyslipidemia, goal LDL below 70 -     Lipid panel; Future -     TSH; Future -     Hepatic function panel; Future  Primary hypertension -     TSH; Future -  Hepatic function panel; Future -     Basic metabolic panel with GFR; Future -     CBC with Differential/Platelet; Future -     EKG 12-Lead  Need for pneumococcal 20-valent conjugate vaccination -     Pneumococcal conjugate vaccine 20-valent     Follow-up: Return in about 6 months (around 08/13/2024).  Sanda Linger, MD

## 2024-02-13 ENCOUNTER — Encounter: Payer: Self-pay | Admitting: Internal Medicine

## 2024-02-15 ENCOUNTER — Encounter: Payer: Self-pay | Admitting: Internal Medicine

## 2024-02-15 ENCOUNTER — Other Ambulatory Visit (HOSPITAL_COMMUNITY): Payer: Self-pay

## 2024-02-15 DIAGNOSIS — Z23 Encounter for immunization: Secondary | ICD-10-CM | POA: Insufficient documentation

## 2024-02-15 DIAGNOSIS — D539 Nutritional anemia, unspecified: Secondary | ICD-10-CM | POA: Insufficient documentation

## 2024-02-15 MED ORDER — ROSUVASTATIN CALCIUM 5 MG PO TABS
5.0000 mg | ORAL_TABLET | Freq: Every day | ORAL | 0 refills | Status: DC
  Filled 2024-02-15: qty 90, 90d supply, fill #0

## 2024-02-16 ENCOUNTER — Other Ambulatory Visit: Payer: Self-pay

## 2024-02-22 ENCOUNTER — Other Ambulatory Visit (INDEPENDENT_AMBULATORY_CARE_PROVIDER_SITE_OTHER)

## 2024-02-22 DIAGNOSIS — D539 Nutritional anemia, unspecified: Secondary | ICD-10-CM | POA: Diagnosis not present

## 2024-02-22 LAB — FOLATE: Folate: >25.2 ng/mL (ref >5.9)

## 2024-02-22 LAB — IBC + FERRITIN
Ferritin: 94.6 ng/mL (ref 10.0–291.0)
Iron: 100 ug/dL (ref 42–145)
Saturation Ratios: 27.1 % (ref 20.0–50.0)
TIBC: 369.6 ug/dL (ref 250.0–450.0)
Transferrin: 264 mg/dL (ref 212.0–360.0)

## 2024-02-22 LAB — VITAMIN B12: Vitamin B-12: 665 pg/mL (ref 211–911)

## 2024-02-24 DIAGNOSIS — Z419 Encounter for procedure for purposes other than remedying health state, unspecified: Secondary | ICD-10-CM | POA: Diagnosis not present

## 2024-02-26 ENCOUNTER — Telehealth: Payer: Self-pay | Admitting: Internal Medicine

## 2024-02-26 ENCOUNTER — Other Ambulatory Visit (HOSPITAL_COMMUNITY): Payer: Self-pay

## 2024-02-26 DIAGNOSIS — R931 Abnormal findings on diagnostic imaging of heart and coronary circulation: Secondary | ICD-10-CM

## 2024-02-26 DIAGNOSIS — E785 Hyperlipidemia, unspecified: Secondary | ICD-10-CM

## 2024-02-26 MED ORDER — PRALUENT 150 MG/ML ~~LOC~~ SOAJ
150.0000 mg | SUBCUTANEOUS | 1 refills | Status: DC
  Filled 2024-02-26: qty 6, 84d supply, fill #0
  Filled 2024-05-20: qty 6, 84d supply, fill #1

## 2024-02-26 NOTE — Telephone Encounter (Signed)
*  STAT* If patient is at the pharmacy, call can be transferred to refill team.   1. Which medications need to be refilled? (please list name of each medication and dose if known) Alirocumab (PRALUENT) 150 MG/ML SOAJ   2. Which pharmacy/location (including street and city if local pharmacy) is medication to be sent to? [place] [facility]    3. Do they need a 30 day or 90 day supply? 90

## 2024-02-27 ENCOUNTER — Encounter: Payer: Self-pay | Admitting: Internal Medicine

## 2024-02-27 ENCOUNTER — Other Ambulatory Visit (HOSPITAL_COMMUNITY): Payer: Self-pay

## 2024-02-27 LAB — RETICULOCYTES
ABS Retic: 54080 {cells}/uL (ref 20000–80000)
Retic Ct Pct: 1.6 %

## 2024-02-27 LAB — VITAMIN B1: Vitamin B1 (Thiamine): 15 nmol/L (ref 8–30)

## 2024-02-27 LAB — ZINC: Zinc: 67 ug/dL (ref 60–130)

## 2024-03-10 ENCOUNTER — Other Ambulatory Visit: Payer: Self-pay | Admitting: Internal Medicine

## 2024-03-10 ENCOUNTER — Other Ambulatory Visit: Payer: Self-pay | Admitting: Family

## 2024-03-10 DIAGNOSIS — I10 Essential (primary) hypertension: Secondary | ICD-10-CM

## 2024-03-10 DIAGNOSIS — K219 Gastro-esophageal reflux disease without esophagitis: Secondary | ICD-10-CM

## 2024-03-12 ENCOUNTER — Encounter: Payer: Self-pay | Admitting: Internal Medicine

## 2024-03-12 ENCOUNTER — Ambulatory Visit: Payer: Medicaid Other | Attending: Internal Medicine | Admitting: Internal Medicine

## 2024-03-12 VITALS — BP 130/70 | HR 70 | Ht 69.0 in | Wt 162.4 lb

## 2024-03-12 DIAGNOSIS — E785 Hyperlipidemia, unspecified: Secondary | ICD-10-CM | POA: Diagnosis not present

## 2024-03-12 DIAGNOSIS — K7581 Nonalcoholic steatohepatitis (NASH): Secondary | ICD-10-CM | POA: Diagnosis not present

## 2024-03-12 DIAGNOSIS — I10 Essential (primary) hypertension: Secondary | ICD-10-CM | POA: Diagnosis not present

## 2024-03-12 NOTE — Progress Notes (Signed)
 OFFICE NOTE  Chief Complaint:  Follow-up  Primary Care Physician: Arcadio Knuckles, MD  HPI:  [name] [name] is a 63 y.o. female with a past medial history significant for cerebral aneurysm, hypertension, dyslipidemia and family history of hypertension and dyslipidemia, but no significant early onset heart disease.  She was noted to have dyslipidemia in the past and this was monitored.  When she changed primary care providers she was started on statin therapy which caused a marked reduction in cholesterol.  The time this was atorvastatin  10 mg, however she had significant elevation of liver enzymes.  An abdominal ultrasound in 2018 demonstrated fatty liver disease is the likely cause of this.  After stopping the statin therapy her liver enzymes returned to baseline which was borderline abnormal.  She was seen by Standley Earing, PharmD, for evaluation of her dyslipidemia as a new patient. Aleen Ammons recommended trying Livalo  1 mg daily.  [name] [name] reports compliance with this medication and had liver enzyme testing 2 months ago which indicated stable liver enzymes.  I suspect she is tolerating this well as the statin is predominantly glucuronidated.  However, it is a low to moderate intensity statin at various doses and may not reduce her cholesterol significantly.  We further discussed her diet today which she felt was very healthy.  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  She reports a very deep knowledge of healthy diet.  There is no documented ASCVD.  05/01/2018  [name] [name] returns today for follow-up.  She has aggressively tried to alter her diet as well as maintain compliance with Livalo  1 mg daily.  We repeated recent lab work which indicates reduction in cholesterol the 271, triglycerides were 154 (reduced from 290), HDL increased from 45-73 and calculated LDL was 167 (up  from 146).  This does represent an increase LDL, however she has had particle shifting.  Liver enzymes have remained stable he mild elevated less than 2 times the upper limit of normal.  She underwent coronary artery calcium  scoring, which demonstrated a calcium  score of 6.  There was some scattered sub-pulmonary nodules and I would recommend a repeat CT scan without contrast in 1 year.  07/31/2018  [name] [name] is seen today in follow-up.  Overall she seems to be doing well.  I increased her Livalo  to 2 mg daily.  This was associated with an increase in liver enzymes which was significant.  Her AST went from 21-67 and ALT from 26-138.  Again, she remains asymptomatic with this, however I have some concern given her history of underlying liver disease whether she will be tolerating this increase.  It is seems that she will not.   12/19/2018  [name] [name] returns today for follow-up.  Overall she has been doing well and tolerating Praluent .  We had to reduce the dose of her Livalo  due to elevated liver enzymes.  Her most recent liver enzymes however have normalized with AST of 33 and ALT of 23.  Her cholesterol has also dropped significantly on Praluent .  Her total cholesterol is 132, triglycerides 173 (decreased from 220), HDL 57 and LDL 40 (decreased from 167).  She is tolerating the medication without any myalgias or other side effects.  12/24/2018  [name] [name] is seen today for annual follow-up.  She continues to do fairly well.  She struggles with low back pain  and some issues related to that.  She has been less physically active due to that.  Over the summer her cholesterol had gone up.  Her total was 199, triglycerides 117, HDL 55 and LDL 91 (increased from 40).  She reports compliance with her Praluent  and Livalo .  She is due for refills.  She has had repeat prior authorization for the medication.   01/20/2021  [name] [name] returns today for follow-up.  She continues to do well on Praluent  in addition to  low-dose Livalo .  Her most recent lipid profile showed total cholesterol 139, HDL 71, triglycerides 86 and LDL 52.  She did have mild elevation ALT to 40 and in October 2021.  In the past she has had more elevated liver enzymes however for the past 2 years they have been generally normal.  She does have underlying hepatic steatosis.  02/10/2022  [name] [name] returns today for follow-up.  She said continues to do well on Praluent  and Livalo .  She has not had repeat lipids but is fasting today.  She would like to have them assessed.  She denies any chest pain or worsening shortness of breath.  She is physically active.  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  Overall she seems to be doing well.  She is enjoying her retirement.  Blood pressure is excellent today 130/70.  Her lipids have been well-controlled.  Recent labs showed total cholesterol 117, HDL 80, triglycerides 129 and LDL 11.  She denies any chest pain or shortness of breath.  PMHx:  Past Medical History:  Diagnosis Date   Bleeds easily (HCC)    Cerebral aneurysm 1999   Surgically coiled   DDD (degenerative disc disease)    GERD (gastroesophageal reflux disease)    History of Barrett's esophagus    Hypercholesterolemia    Hypertension    Irritable bowel disease    Sinus disease     Past Surgical History:  Procedure Laterality Date   ABDOMINAL HYSTERECTOMY  2005   ANAL RECTAL MANOMETRY N/A 09/14/2020   Procedure: ANO RECTAL MANOMETRY;  Surgeon: Melvenia Stabs, MD;  Location: [facility];  Service: General;  Laterality: N/A;   APPENDECTOMY     ARTHROTOMY Right 12/11/2015   Procedure: OPEN ARTHROTOMY ;  Surgeon: Ronn Cohn, MD;  Location: [facility];  Service: Orthopedics;  Laterality: Right;   AUGMENTATION MAMMAPLASTY Bilateral    BREAST ENHANCEMENT SURGERY     CEREBRAL ANEURYSM REPAIR  1999   8 platinum coils   DIAGNOSTIC LAPAROSCOPY     KNEE  ARTHROSCOPY Left 08/03/2021   Procedure: LEFT KNEE ARTHROSCOPY, PARTIAL MEDIAL MENISCECTOMY AND CONDROPLASTY;  Surgeon: Dayne Even, MD;  Location: [facility];  Service: Orthopedics;  Laterality: Left;   LUMBAR DISC SURGERY Left 06/03/2010   Procedure: ANTEROLATERAL DECOMPRESSION AND ARTHRODESIS L3-4, L4-5   NASAL SINUS SURGERY  2013   ORIF ANKLE FRACTURE  03/01/2012   Procedure: OPEN REDUCTION INTERNAL FIXATION (ORIF) ANKLE FRACTURE;  Surgeon: Amada Backer, MD;  Location: [facility];  Service: Orthopedics;  Laterality: Right;  Open reduction internal fixation right navicular fracture   REFRACTIVE SURGERY     SHOULDER ARTHROSCOPY Left    SYNOVECTOMY Right 12/11/2015   Procedure: SYNOVECTOMY DISTAL RADIOULNAR JOINT LOOSE BODY REMOVAL ;  Surgeon: Ronn Cohn, MD;  Location: [facility];  Service: Orthopedics;  Laterality: Right;   WRIST ARTHROSCOPY Right 12/11/2015   Procedure: RIGHT  WRIST ARTHROSCOPY WITH DEBRIDEMENT ;  Surgeon: Ronn Cohn, MD;  Location: [facility];  Service: Orthopedics;  Laterality: Right;    FAMHx:  Family History  Problem Relation Age of Onset   Hypertension Mother    Osteopenia Mother    Hypertension Father    Prostate cancer Father    Osteopenia Maternal Grandmother    Breast cancer Neg Hx     SOCHx:   reports that she quit smoking about 39 years ago. Her smoking use included cigarettes. She has never used smokeless tobacco. She reports current alcohol use of about 24.0 standard drinks of alcohol per week. She reports that she does not use drugs.  ALLERGIES:  Allergies  Allergen Reactions   Hydrocodone-Acetaminophen Itching   Lipitor [Atorvastatin  Calcium ] Other (See Comments)    Elevated liver enzymes   Morphine And Codeine Nausea Only   Tetracyclines & Related Rash    Break out on all extremties    ROS: Pertinent items noted in HPI and remainder of comprehensive ROS otherwise negative.  HOME  MEDS: Current Outpatient Medications on File Prior to Visit  Medication Sig Dispense Refill   Alirocumab  (PRALUENT ) 150 MG/ML SOAJ Inject 1 mL (150 mg total) into the skin every 14 (fourteen) days. 6 mL 1   Calcium  Citrate-Vitamin D  315-250 MG-UNIT TABS Take 1 tablet by mouth 2 (two) times daily.     carvedilol  (COREG ) 3.125 MG tablet Take 1 tablet (3.125 mg total) by mouth 2 (two) times daily with a meal. 180 tablet 0   indapamide  (LOZOL ) 1.25 MG tablet Take 1 tablet (1.25 mg total) by mouth daily. 90 tablet 0   MULTIPLE VITAMINS-MINERALS PO Take 1 tablet by mouth daily.     olmesartan  (BENICAR ) 20 MG tablet Take 1 tablet (20 mg total) by mouth daily. 90 tablet 0   Omega-3 Fatty Acids (FISH OIL) 1000 MG CAPS Take 1,000 mg by mouth daily.     pantoprazole  (PROTONIX ) 40 MG tablet Take 1 tablet (40 mg total) by mouth daily. 90 tablet 0   Probiotic Product (PROBIOTIC PO) Take by mouth daily.     rosuvastatin  (CRESTOR ) 5 MG tablet Take 1 tablet (5 mg total) by mouth daily. 90 tablet 0   Turmeric 500 MG CAPS Take 500 mg by mouth 2 (two) times daily.     valACYclovir  (VALTREX ) 1000 MG tablet Take 0.5 tablets (500 mg total) by mouth daily for supression. May take 2 tablets every morning and evening for 1 day as needed 90 tablet 4   No current facility-administered medications on file prior to visit.    LABS/IMAGING: No results found for this or any previous visit (from the past 48 hours). No results found.  LIPID PANEL:    Component Value Date/Time   CHOL 117 02/12/2024 1210   CHOL 110 03/06/2023 1153   TRIG 129.0 02/12/2024 1210   HDL 80.40 02/12/2024 1210   HDL 65 03/06/2023 1153   CHOLHDL 1 02/12/2024 1210   VLDL 25.8 02/12/2024 1210   LDLCALC 11 02/12/2024 1210   LDLCALC 23 03/06/2023 1153   LDLDIRECT 146.0 11/21/2017 0913     WEIGHTS: Wt Readings from Last 3 Encounters:  03/12/24 162 lb 6.4 oz (73.7 kg)  02/12/24 161 lb (73 kg)  09/07/23 158 lb 9.6 oz (71.9 kg)     VITALS: BP 130/70 (BP Location: Left Arm, Patient Position: Sitting, Cuff Size: Normal)   Pulse 70   Ht 5\' 9"  (1.753 m)   Wt 162 lb  6.4 oz (73.7 kg)   SpO2 98%   BMI 23.98 kg/m   EXAM: General appearance: alert and no distress Lungs: clear to auscultation bilaterally Heart: regular rate and rhythm, S1, S2 normal, no murmur, click, rub or gallop Extremities: extremities normal, atraumatic, no cyanosis or edema Neurologic: Grossly normal  EKG: Deferred  ASSESSMENT: Dyslipidemia -low 10-year risk of 5% CAC of 6 (03/2018) Family history of dyslipidemia and hypertension Hypertension-controlled NAFLD Subcentimeter pulmonary nodules  PLAN: 1.   [name] [name] has had excellent control of her lipids.  Blood pressure is also at goal.  She has had stable, normal liver enzymes.  No changes recommended to her medicines today.  Follow-up with me annually or sooner as necessary.  Hazle Lites, MD, [facility], FNLA, FACP  [facility]  Medical Director of the Advanced Lipid Disorders &  Cardiovascular Risk Reduction Clinic Diplomate of the American Board of Clinical Lipidology Attending Cardiologist  Direct Dial: [phone]  Fax: [phone]  Website:  [email or web address] Nealy Hickmon 03/12/2024, 11:50 AM

## 2024-03-12 NOTE — Patient Instructions (Signed)
 Medication Instructions:  NO CHANGES  *If you need a refill on your cardiac medications before your next appointment, please call your pharmacy*  Lab Work: FASTING lab work in 1 year   If you have labs (blood work) drawn today and your tests are completely normal, you will receive your results only by: MyChart Message (if you have MyChart) OR A paper copy in the mail If you have any lab test that is abnormal or we need to change your treatment, we will call you to review the results.   Follow-Up: At [facility], you and your health needs are our priority.  As part of our continuing mission to provide you with exceptional heart care, our providers are all part of one team.  This team includes your primary Cardiologist (physician) and Advanced Practice Providers or APPs (Physician Assistants and Nurse Practitioners) who all work together to provide you with the care you need, when you need it.  Your next appointment:    12 months with Dr. Maximo Spar  We recommend signing up for the patient portal called "MyChart".  Sign up information is provided on this After Visit Summary.  MyChart is used to connect with patients for Virtual Visits (Telemedicine).  Patients are able to view lab/test results, encounter notes, upcoming appointments, etc.  Non-urgent messages can be sent to your provider as well.   To learn more about what you can do with MyChart, go to [email or web address].

## 2024-03-14 ENCOUNTER — Other Ambulatory Visit (HOSPITAL_COMMUNITY): Payer: Self-pay

## 2024-03-14 ENCOUNTER — Other Ambulatory Visit: Payer: Self-pay

## 2024-03-15 ENCOUNTER — Other Ambulatory Visit (HOSPITAL_COMMUNITY): Payer: Self-pay

## 2024-03-15 ENCOUNTER — Ambulatory Visit: Payer: Self-pay

## 2024-03-15 ENCOUNTER — Other Ambulatory Visit: Payer: Self-pay | Admitting: Internal Medicine

## 2024-03-15 DIAGNOSIS — I10 Essential (primary) hypertension: Secondary | ICD-10-CM

## 2024-03-15 DIAGNOSIS — K219 Gastro-esophageal reflux disease without esophagitis: Secondary | ICD-10-CM

## 2024-03-15 NOTE — Telephone Encounter (Signed)
 Called and spoke with patient: patient is very upset about ordering medication prior to giving out on 03/10/2024 and as of today patient still does not have medication.  Informed patient to call pharmacy to attempt to get some amount of medication to help bridge gap until medications are filled.  Patient very upset.

## 2024-03-15 NOTE — Telephone Encounter (Signed)
 2nd attempt, LVM requesting return call.

## 2024-03-15 NOTE — Telephone Encounter (Signed)
 This RN made first attempt to contact patient. No answer, LVM. Routing for additional attempts.  Copied from CRM [id number]. Topic: Clinical - Prescription Issue >> Mar 15, 2024  1:13 PM [name] [name] wrote: Reason for CRM: Patient stated that she submitted a refill request on 03/10/24 on MyChart and she's currently out of her medication.

## 2024-03-18 ENCOUNTER — Other Ambulatory Visit (HOSPITAL_COMMUNITY): Payer: Self-pay

## 2024-03-19 ENCOUNTER — Other Ambulatory Visit (HOSPITAL_COMMUNITY): Payer: Self-pay

## 2024-03-19 ENCOUNTER — Other Ambulatory Visit: Payer: Self-pay | Admitting: Internal Medicine

## 2024-03-19 ENCOUNTER — Other Ambulatory Visit: Payer: Self-pay

## 2024-03-19 DIAGNOSIS — E785 Hyperlipidemia, unspecified: Secondary | ICD-10-CM

## 2024-03-19 DIAGNOSIS — K219 Gastro-esophageal reflux disease without esophagitis: Secondary | ICD-10-CM

## 2024-03-19 DIAGNOSIS — I10 Essential (primary) hypertension: Secondary | ICD-10-CM

## 2024-03-19 MED ORDER — OLMESARTAN MEDOXOMIL 20 MG PO TABS
20.0000 mg | ORAL_TABLET | Freq: Every day | ORAL | 1 refills | Status: DC
  Filled 2024-03-19 (×2): qty 90, 90d supply, fill #0
  Filled 2024-06-10: qty 90, 90d supply, fill #1

## 2024-03-19 MED ORDER — INDAPAMIDE 1.25 MG PO TABS
1.2500 mg | ORAL_TABLET | Freq: Every day | ORAL | 1 refills | Status: DC
  Filled 2024-03-19 (×2): qty 90, 90d supply, fill #0
  Filled 2024-06-10: qty 90, 90d supply, fill #1

## 2024-03-19 MED ORDER — ROSUVASTATIN CALCIUM 5 MG PO TABS
5.0000 mg | ORAL_TABLET | Freq: Every day | ORAL | 1 refills | Status: DC
  Filled 2024-03-19 – 2024-05-20 (×2): qty 90, 90d supply, fill #0
  Filled 2024-06-10 – 2024-08-21 (×2): qty 90, 90d supply, fill #1

## 2024-03-19 MED ORDER — PANTOPRAZOLE SODIUM 40 MG PO TBEC
40.0000 mg | DELAYED_RELEASE_TABLET | Freq: Every day | ORAL | 1 refills | Status: DC
  Filled 2024-03-19 (×2): qty 90, 90d supply, fill #0
  Filled 2024-06-10: qty 90, 90d supply, fill #1

## 2024-03-19 MED ORDER — CARVEDILOL 3.125 MG PO TABS
3.1250 mg | ORAL_TABLET | Freq: Two times a day (BID) | ORAL | 1 refills | Status: DC
  Filled 2024-03-19 (×2): qty 180, 90d supply, fill #0
  Filled 2024-06-10: qty 180, 90d supply, fill #1

## 2024-03-20 NOTE — Telephone Encounter (Signed)
 This has been handled.

## 2024-03-25 DIAGNOSIS — Z419 Encounter for procedure for purposes other than remedying health state, unspecified: Secondary | ICD-10-CM | POA: Diagnosis not present

## 2024-04-11 ENCOUNTER — Other Ambulatory Visit: Payer: Self-pay

## 2024-04-11 ENCOUNTER — Encounter: Payer: Self-pay | Admitting: Family Medicine

## 2024-04-11 ENCOUNTER — Ambulatory Visit: Admitting: Family Medicine

## 2024-04-11 ENCOUNTER — Other Ambulatory Visit (HOSPITAL_COMMUNITY): Payer: Self-pay

## 2024-04-11 VITALS — BP 118/84 | HR 80 | Ht 69.0 in | Wt 160.0 lb

## 2024-04-11 DIAGNOSIS — M25552 Pain in left hip: Secondary | ICD-10-CM | POA: Diagnosis not present

## 2024-04-11 MED ORDER — PREDNISONE 20 MG PO TABS
20.0000 mg | ORAL_TABLET | Freq: Every day | ORAL | 0 refills | Status: DC
  Filled 2024-04-11 – 2024-04-26 (×3): qty 3, 3d supply, fill #0

## 2024-04-11 MED ORDER — PREDNISONE 20 MG PO TABS
20.0000 mg | ORAL_TABLET | Freq: Every day | ORAL | 0 refills | Status: DC
  Filled 2024-04-11 (×2): qty 21, 21d supply, fill #0

## 2024-04-11 MED ORDER — VITAMIN D (ERGOCALCIFEROL) 1.25 MG (50000 UNIT) PO CAPS
50000.0000 [IU] | ORAL_CAPSULE | ORAL | 0 refills | Status: DC
Start: 2024-04-11 — End: 2024-07-11
  Filled 2024-04-11 (×2): qty 12, 84d supply, fill #0

## 2024-04-11 NOTE — Patient Instructions (Addendum)
 Mirilax 17g daily, Colace 100mg  daily for 10 days Prenisone 40mg  for 5 days Then 20mg  on your trip daily Ice after activity Limit ROM on rowing machine Once weekly Vit D See me in 6 weeks

## 2024-04-11 NOTE — Assessment & Plan Note (Signed)
 With ultrasound it appears that patient does have unfortunate calcific changes that could be potentially contributing to impingement.  May need to consider advanced imaging.  X-rays only showed mild arthritic changes previously.  We discussed different treatment options including prednisone , injections, formal physical therapy, shockwave therapy.  Patient has decided to try the prednisone  and see how patient responds.  Follow-up again in 4 to 8 weeks to see how patient is responding to the conservative therapy.

## 2024-04-11 NOTE — Progress Notes (Signed)
 [facility] [address] [place] [address] Phone: [phone] Subjective:   Delwyn Filippo, am serving as a scribe for Dr. Ronnell Coins.  I'm seeing this patient by the request  of:  Arcadio Knuckles, MD  CC: Left hip pain  [id number]  [name] [name] is a 63 y.o. female coming in with complaint of L hip pain. Patient states that her L hip will buckle when she stands up from lumbar flexed position. Pain keeps her up at night. If she puts pressure on the front of hip her pain will subside. Going to [place] on June 18th.   08/2023 L hip xray IMPRESSION: Minimal degenerative changes about the hips.  No acute finding.    Patient's primary care physician did get x-rays in October of last year showing some mild arthritic changes of the hips bilaterally.  Looks extremely similar to x-rays from 2022.  Patient previously in 2019 did have an MRI of the back bone showing that the patient did have the history of prior interbody fusion at L4-L5 as well as L3-L4.  Mild adjacent segment disease at L2-L3.  Past Medical History:  Diagnosis Date   Bleeds easily (HCC)    Cerebral aneurysm 1999   Surgically coiled   DDD (degenerative disc disease)    GERD (gastroesophageal reflux disease)    History of Barrett's esophagus    Hypercholesterolemia    Hypertension    Irritable bowel disease    Sinus disease    Past Surgical History:  Procedure Laterality Date   ABDOMINAL HYSTERECTOMY  2005   ANAL RECTAL MANOMETRY N/A 09/14/2020   Procedure: ANO RECTAL MANOMETRY;  Surgeon: Melvenia Stabs, MD;  Location: [facility];  Service: General;  Laterality: N/A;   APPENDECTOMY     ARTHROTOMY Right 12/11/2015   Procedure: OPEN ARTHROTOMY ;  Surgeon: Ronn Cohn, MD;  Location: [facility];  Service: Orthopedics;  Laterality: Right;   AUGMENTATION MAMMAPLASTY Bilateral    BREAST ENHANCEMENT SURGERY     CEREBRAL ANEURYSM REPAIR  1999   8  platinum coils   DIAGNOSTIC LAPAROSCOPY     KNEE ARTHROSCOPY Left 08/03/2021   Procedure: LEFT KNEE ARTHROSCOPY, PARTIAL MEDIAL MENISCECTOMY AND CONDROPLASTY;  Surgeon: Dayne Even, MD;  Location: [facility];  Service: Orthopedics;  Laterality: Left;   LUMBAR DISC SURGERY Left 06/03/2010   Procedure: ANTEROLATERAL DECOMPRESSION AND ARTHRODESIS L3-4, L4-5   NASAL SINUS SURGERY  2013   ORIF ANKLE FRACTURE  03/01/2012   Procedure: OPEN REDUCTION INTERNAL FIXATION (ORIF) ANKLE FRACTURE;  Surgeon: Amada Backer, MD;  Location: [facility];  Service: Orthopedics;  Laterality: Right;  Open reduction internal fixation right navicular fracture   REFRACTIVE SURGERY     SHOULDER ARTHROSCOPY Left    SYNOVECTOMY Right 12/11/2015   Procedure: SYNOVECTOMY DISTAL RADIOULNAR JOINT LOOSE BODY REMOVAL ;  Surgeon: Ronn Cohn, MD;  Location: [facility];  Service: Orthopedics;  Laterality: Right;   WRIST ARTHROSCOPY Right 12/11/2015   Procedure: RIGHT WRIST ARTHROSCOPY WITH DEBRIDEMENT ;  Surgeon: Ronn Cohn, MD;  Location: [facility];  Service: Orthopedics;  Laterality: Right;   Social History   Socioeconomic History   Marital status: Divorced    Spouse name: Not on file   Number of children: Not on file   Years of education: Not on file   Highest education level: Associate degree: occupational, Scientist, product/process development, or vocational program  Occupational History    Employer: [employer]  Tobacco Use   Smoking status: Former    Current packs/day: 0.00    Types: Cigarettes    Quit date: 11/14/1984    Years since quitting: 39.4   Smokeless tobacco: Never  Vaping Use   Vaping status: Never Used  Substance and Sexual Activity   Alcohol use: Yes    Alcohol/week: 24.0 standard drinks of alcohol    Types: 24 Glasses of wine per week   Drug use: No   Sexual activity: Not Currently    Partners: Male  Other Topics Concern   Not on file  Social History Narrative   The  patient works as a Engineer, 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 Her daughter and 37-month-old granddaughter live with her. She does not smoke cigarettes.   Social Drivers of Corporate investment banker Strain: Low Risk  (02/09/2024)   Overall Financial Resource Strain (CARDIA)    Difficulty of Paying Living Expenses: Not hard at all  Food Insecurity: No Food Insecurity (02/09/2024)   Hunger Vital Sign    Worried About Running Out of Food in the Last Year: Never true    Ran Out of Food in the Last Year: Never true  Transportation Needs: No Transportation Needs (02/09/2024)   PRAPARE - Administrator, Civil Service (Medical): No    Lack of Transportation (Non-Medical): No  Physical Activity: Sufficiently Active (02/09/2024)   Exercise Vital Sign    Days of Exercise per Week: 5 days    Minutes of Exercise per Session: 40 min  Stress: No Stress Concern Present (02/09/2024)   [employer] of Occupational Health - Occupational Stress Questionnaire    Feeling of Stress : Not at all  Social Connections: Unknown (02/09/2024)   Social Connection and Isolation Panel [NHANES]    Frequency of Communication with Friends and Family: More than three times a week    Frequency of Social Gatherings with Friends and Family: Twice a week    Attends Religious Services: Patient declined    Database administrator or Organizations: No    Attends Engineer, structural: Not on file    Marital Status: Divorced   Allergies  Allergen Reactions   Hydrocodone-Acetaminophen Itching   Lipitor [Atorvastatin  Calcium ] Other (See Comments)    Elevated liver enzymes   Morphine And Codeine Nausea Only   Tetracyclines & Related Rash    Break out on all extremties   Family History  Problem Relation Age of Onset   Hypertension Mother    Osteopenia Mother    Hypertension Father    Prostate cancer Father    Osteopenia Maternal Grandmother    Breast cancer Neg Hx      Current Outpatient  Medications (Cardiovascular):    Alirocumab  (PRALUENT ) 150 MG/ML SOAJ, Inject 1 mL (150 mg total) into the skin every 14 (fourteen) days.   carvedilol  (COREG ) 3.125 MG tablet, Take 1 tablet (3.125 mg total) by mouth 2 (two) times daily with a meal.   indapamide  (LOZOL ) 1.25 MG tablet, Take 1 tablet (1.25 mg total) by mouth daily.   olmesartan  (BENICAR ) 20 MG tablet, Take 1 tablet (20 mg total) by mouth daily.   rosuvastatin  (CRESTOR ) 5 MG tablet, Take 1 tablet (5 mg total) by mouth daily.     Current Outpatient Medications (Other):    Calcium  Citrate-Vitamin D  315-250 MG-UNIT TABS, Take 1 tablet by mouth 2 (two) times daily.   MULTIPLE VITAMINS-MINERALS PO, Take 1 tablet by mouth daily.   Omega-3 Fatty Acids (FISH  OIL) 1000 MG CAPS, Take 1,000 mg by mouth daily.   pantoprazole  (PROTONIX ) 40 MG tablet, Take 1 tablet (40 mg total) by mouth daily.   Probiotic Product (PROBIOTIC PO), Take by mouth daily.   Turmeric 500 MG CAPS, Take 500 mg by mouth 2 (two) times daily.   valACYclovir  (VALTREX ) 1000 MG tablet, Take 0.5 tablets (500 mg total) by mouth daily for supression. May take 2 tablets every morning and evening for 1 day as needed   Reviewed prior external information including notes and imaging from  primary care provider As well as notes that were available from care everywhere and other healthcare systems.  Past medical history, social, surgical and family history all reviewed in electronic medical record.  No pertanent information unless stated regarding to the chief complaint.   Review of Systems:  No headache, visual changes, nausea, vomiting, diarrhea, constipation, dizziness, abdominal pain, skin rash, fevers, chills, night sweats, weight loss, swollen lymph nodes, body aches, joint swelling, chest pain, shortness of breath, mood changes. POSITIVE muscle aches  Objective  There were no vitals taken for this visit.   General: No apparent distress alert and oriented x3 mood and  affect normal, dressed appropriately.  HEENT: Pupils equal, extraocular movements intact  Respiratory: Patient's speak in full sentences and does not appear short of breath  Cardiovascular: No lower extremity edema, non tender, no erythema  Low back exam shows patient does have significant loss of lordosis.  Tightness noted in the paraspinal musculature.  Tightness with Veldon German left greater than right.  Hip exam shows positive impingement noted on the anterior lateral aspect of the hip.  Patient has good internal and external range of motion but does have tightness with FABER left greater than right.  Limited muscular skeletal ultrasound was performed and interpreted by Ronnell Coins, M  Left hip shows that there is a calcific change noted in the anterior lateral aspect of the hip that does seem to be more of a bone spur with dynamic movement does seem to cause some impingement in the area..    Impression and Recommendations:    The above documentation has been reviewed and is accurate and complete [name] [name] M Alyssamarie Mounsey, DO

## 2024-04-12 ENCOUNTER — Other Ambulatory Visit (HOSPITAL_COMMUNITY): Payer: Self-pay

## 2024-04-25 DIAGNOSIS — Z419 Encounter for procedure for purposes other than remedying health state, unspecified: Secondary | ICD-10-CM | POA: Diagnosis not present

## 2024-04-26 ENCOUNTER — Other Ambulatory Visit (HOSPITAL_COMMUNITY): Payer: Self-pay

## 2024-05-13 NOTE — Progress Notes (Signed)
 [facility] [address] [place] [address] Phone: [phone] Subjective:   ISusannah Gully, am serving as a scribe for Dr. Arthea Claudene.  I'm seeing this patient by the request  of:  Joshua Debby CROME, MD  CC: Left hip pain follow-up  YEP:Dlagzrupcz  04/11/2024 With ultrasound it appears that patient does have unfortunate calcific changes that could be potentially contributing to impingement.  May need to consider advanced imaging.  X-rays only showed mild arthritic changes previously.  We discussed different treatment options including prednisone , injections, formal physical therapy, shockwave therapy.  Patient has decided to try the prednisone  and see how patient responds.  Follow-up again in 4 to 8 weeks to see how patient is responding to the conservative therapy.      Update 05/23/2024 MILLISSA DEESE is a 63 y.o. female coming in with complaint of L hip pain. Patient states hip did well over trip. Took steroid on trip. Last few days of trip were not the best, doing more bending. Walking was okay.       Past Medical History:  Diagnosis Date   Bleeds easily (HCC)    Cerebral aneurysm 1999   Surgically coiled   DDD (degenerative disc disease)    GERD (gastroesophageal reflux disease)    History of Barrett's esophagus    Hypercholesterolemia    Hypertension    Irritable bowel disease    Sinus disease    Past Surgical History:  Procedure Laterality Date   ABDOMINAL HYSTERECTOMY  2005   ANAL RECTAL MANOMETRY N/A 09/14/2020   Procedure: ANO RECTAL MANOMETRY;  Surgeon: Teresa Lonni HERO, MD;  Location: [facility];  Service: General;  Laterality: N/A;   APPENDECTOMY     ARTHROTOMY Right 12/11/2015   Procedure: OPEN ARTHROTOMY ;  Surgeon: Elsie Mussel, MD;  Location: [facility];  Service: Orthopedics;  Laterality: Right;   AUGMENTATION MAMMAPLASTY Bilateral    BREAST ENHANCEMENT SURGERY     CEREBRAL ANEURYSM REPAIR   1999   8 platinum coils   DIAGNOSTIC LAPAROSCOPY     KNEE ARTHROSCOPY Left 08/03/2021   Procedure: LEFT KNEE ARTHROSCOPY, PARTIAL MEDIAL MENISCECTOMY AND CONDROPLASTY;  Surgeon: Sheril Coy, MD;  Location: [facility];  Service: Orthopedics;  Laterality: Left;   LUMBAR DISC SURGERY Left 06/03/2010   Procedure: ANTEROLATERAL DECOMPRESSION AND ARTHRODESIS L3-4, L4-5   NASAL SINUS SURGERY  2013   ORIF ANKLE FRACTURE  03/01/2012   Procedure: OPEN REDUCTION INTERNAL FIXATION (ORIF) ANKLE FRACTURE;  Surgeon: Norleen Armor, MD;  Location: [facility];  Service: Orthopedics;  Laterality: Right;  Open reduction internal fixation right navicular fracture   REFRACTIVE SURGERY     SHOULDER ARTHROSCOPY Left    SYNOVECTOMY Right 12/11/2015   Procedure: SYNOVECTOMY DISTAL RADIOULNAR JOINT LOOSE BODY REMOVAL ;  Surgeon: Elsie Mussel, MD;  Location: [facility];  Service: Orthopedics;  Laterality: Right;   WRIST ARTHROSCOPY Right 12/11/2015   Procedure: RIGHT WRIST ARTHROSCOPY WITH DEBRIDEMENT ;  Surgeon: Elsie Mussel, MD;  Location: [facility];  Service: Orthopedics;  Laterality: Right;   Social History   Socioeconomic History   Marital status: Divorced    Spouse name: Not on file   Number of children: Not on file   Years of education: Not on file   Highest education level: Associate degree: occupational, Scientist, product/process development, or vocational program  Occupational History    Employer: [employer]  Tobacco Use   Smoking status: Former  Current packs/day: 0.00    Types: Cigarettes    Quit date: 11/14/1984    Years since quitting: 39.5   Smokeless tobacco: Never  Vaping Use   Vaping status: Never Used  Substance and Sexual Activity   Alcohol use: Yes    Alcohol/week: 24.0 standard drinks of alcohol    Types: 24 Glasses of wine per week   Drug use: No   Sexual activity: Not Currently    Partners: Male  Other Topics Concern   Not on file  Social History Narrative    The patient works as a Engineer, 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 Her daughter and 73-month-old granddaughter live with her. She does not smoke cigarettes.   Social Drivers of Corporate investment banker Strain: Low Risk  (02/09/2024)   Overall Financial Resource Strain (CARDIA)    Difficulty of Paying Living Expenses: Not hard at all  Food Insecurity: No Food Insecurity (02/09/2024)   Hunger Vital Sign    Worried About Running Out of Food in the Last Year: Never true    Ran Out of Food in the Last Year: Never true  Transportation Needs: No Transportation Needs (02/09/2024)   PRAPARE - Administrator, Civil Service (Medical): No    Lack of Transportation (Non-Medical): No  Physical Activity: Sufficiently Active (02/09/2024)   Exercise Vital Sign    Days of Exercise per Week: 5 days    Minutes of Exercise per Session: 40 min  Stress: No Stress Concern Present (02/09/2024)   [employer] of Occupational Health - Occupational Stress Questionnaire    Feeling of Stress : Not at all  Social Connections: Unknown (02/09/2024)   Social Connection and Isolation Panel    Frequency of Communication with Friends and Family: More than three times a week    Frequency of Social Gatherings with Friends and Family: Twice a week    Attends Religious Services: Patient declined    Database administrator or Organizations: No    Attends Engineer, structural: Not on file    Marital Status: Divorced   Allergies  Allergen Reactions   Hydrocodone-Acetaminophen Itching   Lipitor [Atorvastatin  Calcium ] Other (See Comments)    Elevated liver enzymes   Morphine And Codeine Nausea Only   Tetracyclines & Related Rash    Break out on all extremties   Family History  Problem Relation Age of Onset   Hypertension Mother    Osteopenia Mother    Hypertension Father    Prostate cancer Father    Osteopenia Maternal Grandmother    Breast cancer Neg Hx     Current Outpatient Medications  (Endocrine & Metabolic):    predniSONE  (DELTASONE ) 20 MG tablet, Take 1 tablet (20 mg total) by mouth daily with breakfast.  Current Outpatient Medications (Cardiovascular):    Alirocumab  (PRALUENT ) 150 MG/ML SOAJ, Inject 1 mL (150 mg total) into the skin every 14 (fourteen) days.   carvedilol  (COREG ) 3.125 MG tablet, Take 1 tablet (3.125 mg total) by mouth 2 (two) times daily with a meal.   indapamide  (LOZOL ) 1.25 MG tablet, Take 1 tablet (1.25 mg total) by mouth daily.   olmesartan  (BENICAR ) 20 MG tablet, Take 1 tablet (20 mg total) by mouth daily.   rosuvastatin  (CRESTOR ) 5 MG tablet, Take 1 tablet (5 mg total) by mouth daily.     Current Outpatient Medications (Other):    Calcium  Citrate-Vitamin D  315-250 MG-UNIT TABS, Take 1 tablet by mouth 2 (two) times daily.   MULTIPLE  VITAMINS-MINERALS PO, Take 1 tablet by mouth daily.   Omega-3 Fatty Acids (FISH OIL) 1000 MG CAPS, Take 1,000 mg by mouth daily.   pantoprazole  (PROTONIX ) 40 MG tablet, Take 1 tablet (40 mg total) by mouth daily.   Probiotic Product (PROBIOTIC PO), Take by mouth daily.   Turmeric 500 MG CAPS, Take 500 mg by mouth 2 (two) times daily.   valACYclovir  (VALTREX ) 1000 MG tablet, Take 0.5 tablets (500 mg total) by mouth daily for supression. May take 2 tablets every morning and evening for 1 day as needed   Vitamin D , Ergocalciferol , (DRISDOL ) 1.25 MG (50000 UNIT) CAPS capsule, Take 1 capsule (50,000 Units total) by mouth every 7 (seven) days.   Reviewed prior external information including notes and imaging from  primary care provider As well as notes that were available from care everywhere and other healthcare systems.  Past medical history, social, surgical and family history all reviewed in electronic medical record.  No pertanent information unless stated regarding to the chief complaint.   Review of Systems:  No headache, visual changes, nausea, vomiting, diarrhea, constipation, dizziness, abdominal pain, skin  rash, fevers, chills, night sweats, weight loss, swollen lymph nodes, body aches, joint swelling, chest pain, shortness of breath, mood changes. POSITIVE muscle aches  Objective  Blood pressure 122/80, pulse 85, height 5' 9 (1.753 m), weight 161 lb (73 kg), SpO2 98%.   General: No apparent distress alert and oriented x3 mood and affect normal, dressed appropriately.  HEENT: Pupils equal, extraocular movements intact  Respiratory: Patient's speak in full sentences and does not appear short of breath  Cardiovascular: No lower extremity edema, non tender, no erythema  Left hip does have significant decrease in range of motion lateral side.  Patient is tender to palpation noted as well.  Positive grind test noted.  Antalgic gait noted.    Impression and Recommendations:    The above documentation has been reviewed and is accurate and complete Cherilynn Schomburg M Dezzie Badilla, DO

## 2024-05-15 ENCOUNTER — Ambulatory Visit: Admitting: Family Medicine

## 2024-05-20 ENCOUNTER — Other Ambulatory Visit: Payer: Self-pay

## 2024-05-20 ENCOUNTER — Other Ambulatory Visit (HOSPITAL_COMMUNITY): Payer: Self-pay

## 2024-05-23 ENCOUNTER — Encounter: Payer: Self-pay | Admitting: Family Medicine

## 2024-05-23 ENCOUNTER — Ambulatory Visit: Admitting: Family Medicine

## 2024-05-23 VITALS — BP 122/80 | HR 85 | Ht 69.0 in | Wt 161.0 lb

## 2024-05-23 DIAGNOSIS — M25552 Pain in left hip: Secondary | ICD-10-CM

## 2024-05-23 NOTE — Patient Instructions (Addendum)
 Good to see you! Glad you had fun on your trip [facility] [phone] Call Today  When we receive your results we will contact you.

## 2024-05-23 NOTE — Assessment & Plan Note (Signed)
 Left hip pain is out of proportion to the amount of palpation today.  Positive atypical fracture with patient has had previously.  Differential includes labral pathology as well.  I do feel at this point we should consider advanced imaging with an MRI of the left hip and will get an MR arthrogram to further evaluate.   Increase activity slowly.  Follow-up again after imaging to discuss

## 2024-05-28 ENCOUNTER — Encounter: Payer: Self-pay | Admitting: Family Medicine

## 2024-06-10 ENCOUNTER — Other Ambulatory Visit (HOSPITAL_COMMUNITY): Payer: Self-pay

## 2024-06-11 ENCOUNTER — Inpatient Hospital Stay
Admission: RE | Admit: 2024-06-11 | Discharge: 2024-06-11 | Disposition: A | Discharge status: Home / Self Care | Source: Ambulatory Visit | Attending: Family Medicine | Admitting: Family Medicine

## 2024-06-11 ENCOUNTER — Other Ambulatory Visit: Payer: Self-pay

## 2024-06-11 ENCOUNTER — Ambulatory Visit
Admission: RE | Admit: 2024-06-11 | Discharge: 2024-06-11 | Disposition: A | Discharge status: Home / Self Care | Source: Ambulatory Visit | Attending: Family Medicine | Admitting: Family Medicine

## 2024-06-11 DIAGNOSIS — M25552 Pain in left hip: Secondary | ICD-10-CM

## 2024-06-11 MED ORDER — GADOPICLENOL 0.5 MMOL/ML IV SOLN
0.1000 mL | Freq: Once | INTRAVENOUS | Status: AC | PRN
  Administered 2024-06-11: 0.1 mL via INTRAVENOUS

## 2024-06-11 MED ORDER — IOPAMIDOL (ISOVUE-M 200) INJECTION 41%
10.0000 mL | Freq: Once | INTRAMUSCULAR | Status: AC
  Administered 2024-06-11: 10 mL via INTRA_ARTICULAR

## 2024-06-12 ENCOUNTER — Ambulatory Visit: Payer: Self-pay | Admitting: Family Medicine

## 2024-07-08 NOTE — Progress Notes (Unsigned)
 [facility] [address] [place] [address] Phone: [phone] Subjective:   [name] [name], am serving as a scribe for Dr. Arthea Claudene.  I'm seeing this patient by the request  of:  Joshua Debby CROME, MD  CC: Left hip pain  YEP:Dlagzrupcz  05/23/2024 Left hip pain is out of proportion to the amount of palpation today.  Positive atypical fracture with patient has had previously.  Differential includes labral pathology as well.  I do feel at this point we should consider advanced imaging with an MRI of the left hip and will get an MR arthrogram to further evaluate.   Increase activity slowly.  Follow-up again after imaging to discuss     Updated 07/11/2024 [name] [name] is a 63 y.o. female coming in with complaint of L hip pain. Patient states that she is not exercising at much and her hip is doing better. She wants to get back to working out though. After contrast injection she had no pain for 3 weeks. Pain is not as frequent. Wants to discuss options moving forward.   MRI IMPRESSION: 1. No acute osseous abnormality. 2. Mild osteoarthritis of the left hip. No discrete labral tear identified. 3. Mild tendinosis of the left gluteus minimus cuff insertion. 4. 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     Past Medical History:  Diagnosis Date   Bleeds easily (HCC)    Cerebral aneurysm 1999   Surgically coiled   DDD (degenerative disc disease)    GERD (gastroesophageal reflux disease)    History of Barrett's esophagus    Hypercholesterolemia    Hypertension    Irritable bowel disease    Sinus disease    Past Surgical History:  Procedure Laterality Date   ABDOMINAL HYSTERECTOMY  2005   ANAL RECTAL MANOMETRY N/A 09/14/2020   Procedure: ANO RECTAL MANOMETRY;  Surgeon: Teresa Lonni HERO, MD;  Location: [facility];  Service: General;  Laterality: N/A;   APPENDECTOMY     ARTHROTOMY Right 12/11/2015   Procedure: OPEN ARTHROTOMY ;   Surgeon: Elsie Mussel, MD;  Location: [facility];  Service: Orthopedics;  Laterality: Right;   AUGMENTATION MAMMAPLASTY Bilateral    BREAST ENHANCEMENT SURGERY     CEREBRAL ANEURYSM REPAIR  1999   8 platinum coils   DIAGNOSTIC LAPAROSCOPY     KNEE ARTHROSCOPY Left 08/03/2021   Procedure: LEFT KNEE ARTHROSCOPY, PARTIAL MEDIAL MENISCECTOMY AND CONDROPLASTY;  Surgeon: Sheril Coy, MD;  Location: [facility];  Service: Orthopedics;  Laterality: Left;   LUMBAR DISC SURGERY Left 06/03/2010   Procedure: ANTEROLATERAL DECOMPRESSION AND ARTHRODESIS L3-4, L4-5   NASAL SINUS SURGERY  2013   ORIF ANKLE FRACTURE  03/01/2012   Procedure: OPEN REDUCTION INTERNAL FIXATION (ORIF) ANKLE FRACTURE;  Surgeon: Norleen Armor, MD;  Location: [facility];  Service: Orthopedics;  Laterality: Right;  Open reduction internal fixation right navicular fracture   REFRACTIVE SURGERY     SHOULDER ARTHROSCOPY Left    SYNOVECTOMY Right 12/11/2015   Procedure: SYNOVECTOMY DISTAL RADIOULNAR JOINT LOOSE BODY REMOVAL ;  Surgeon: Elsie Mussel, MD;  Location: [facility];  Service: Orthopedics;  Laterality: Right;   WRIST ARTHROSCOPY Right 12/11/2015   Procedure: RIGHT WRIST ARTHROSCOPY WITH DEBRIDEMENT ;  Surgeon: Elsie Mussel, MD;  Location: [facility];  Service: Orthopedics;  Laterality: Right;   Social History   Socioeconomic History   Marital status: Divorced    Spouse name: Not on file  Number of children: Not on file   Years of education: Not on file   Highest education level: Associate degree: occupational, Scientist, product/process development, or vocational program  Occupational History    Employer: [employer]  Tobacco Use   Smoking status: Former    Current packs/day: 0.00    Types: Cigarettes    Quit date: 11/14/1984    Years since quitting: 39.6   Smokeless tobacco: Never  Vaping Use   Vaping status: Never Used  Substance and Sexual Activity   Alcohol use: Yes     Alcohol/week: 24.0 standard drinks of alcohol    Types: 24 Glasses of wine per week   Drug use: No   Sexual activity: Not Currently    Partners: Male  Other Topics Concern   Not on file  Social History Narrative   The patient works as a Engineer, 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 Her daughter and 94-month-old granddaughter live with her. She does not smoke cigarettes.   Social Drivers of Corporate investment banker Strain: Low Risk  (02/09/2024)   Overall Financial Resource Strain (CARDIA)    Difficulty of Paying Living Expenses: Not hard at all  Food Insecurity: No Food Insecurity (02/09/2024)   Hunger Vital Sign    Worried About Running Out of Food in the Last Year: Never true    Ran Out of Food in the Last Year: Never true  Transportation Needs: No Transportation Needs (02/09/2024)   PRAPARE - Administrator, Civil Service (Medical): No    Lack of Transportation (Non-Medical): No  Physical Activity: Sufficiently Active (02/09/2024)   Exercise Vital Sign    Days of Exercise per Week: 5 days    Minutes of Exercise per Session: 40 min  Stress: No Stress Concern Present (02/09/2024)   [employer] of Occupational Health - Occupational Stress Questionnaire    Feeling of Stress : Not at all  Social Connections: Unknown (02/09/2024)   Social Connection and Isolation Panel    Frequency of Communication with Friends and Family: More than three times a week    Frequency of Social Gatherings with Friends and Family: Twice a week    Attends Religious Services: Patient declined    Database administrator or Organizations: No    Attends Engineer, structural: Not on file    Marital Status: Divorced   Allergies  Allergen Reactions   Hydrocodone-Acetaminophen Itching   Lipitor [Atorvastatin  Calcium ] Other (See Comments)    Elevated liver enzymes   Morphine And Codeine Nausea Only   Tetracyclines & Related Rash    Break out on all extremties   Family History   Problem Relation Age of Onset   Hypertension Mother    Osteopenia Mother    Hypertension Father    Prostate cancer Father    Osteopenia Maternal Grandmother    Breast cancer Neg Hx     Current Outpatient Medications (Endocrine & Metabolic):    predniSONE  (DELTASONE ) 20 MG tablet, Take 1 tablet (20 mg total) by mouth daily with breakfast.  Current Outpatient Medications (Cardiovascular):    Alirocumab  (PRALUENT ) 150 MG/ML SOAJ, Inject 1 mL (150 mg total) into the skin every 14 (fourteen) days.   carvedilol  (COREG ) 3.125 MG tablet, Take 1 tablet (3.125 mg total) by mouth 2 (two) times daily with a meal.   indapamide  (LOZOL ) 1.25 MG tablet, Take 1 tablet (1.25 mg total) by mouth daily.   olmesartan  (BENICAR ) 20 MG tablet, Take 1 tablet (20 mg total) by mouth  daily.   rosuvastatin  (CRESTOR ) 5 MG tablet, Take 1 tablet (5 mg total) by mouth daily.   Current Outpatient Medications (Analgesics):    celecoxib  (CELEBREX ) 100 MG capsule, Take 1 capsule (100 mg total) by mouth 2 (two) times daily.   Current Outpatient Medications (Other):    Calcium  Citrate-Vitamin D  315-250 MG-UNIT TABS, Take 1 tablet by mouth 2 (two) times daily.   MULTIPLE VITAMINS-MINERALS PO, Take 1 tablet by mouth daily.   Omega-3 Fatty Acids (FISH OIL) 1000 MG CAPS, Take 1,000 mg by mouth daily.   pantoprazole  (PROTONIX ) 40 MG tablet, Take 1 tablet (40 mg total) by mouth daily.   Probiotic Product (PROBIOTIC PO), Take by mouth daily.   Turmeric 500 MG CAPS, Take 500 mg by mouth 2 (two) times daily.   valACYclovir  (VALTREX ) 1000 MG tablet, Take 0.5 tablets (500 mg total) by mouth daily for supression. May take 2 tablets every morning and evening for 1 day as needed   Vitamin D , Ergocalciferol , (DRISDOL ) 1.25 MG (50000 UNIT) CAPS capsule, Take 1 capsule (50,000 Units total) by mouth every 7 (seven) days.   Reviewed prior external information including notes and imaging from  primary care provider As well as notes that  were available from care everywhere and other healthcare systems.  Past medical history, social, surgical and family history all reviewed in electronic medical record.  No pertanent information unless stated regarding to the chief complaint.   Review of Systems:  No headache, visual changes, nausea, vomiting, diarrhea, constipation, dizziness, abdominal pain, skin rash, fevers, chills, night sweats, weight loss, swollen lymph nodes, body aches, joint swelling, chest pain, shortness of breath, mood changes. POSITIVE muscle aches  Objective  Blood pressure 128/88, temperature (!) 80 F (26.7 C), height 5' 9 (1.753 m), weight 165 lb (74.8 kg), SpO2 98%.   General: No apparent distress alert and oriented x3 mood and affect normal, dressed appropriately.  HEENT: Pupils equal, extraocular movements intact  Respiratory: Patient's speak in full sentences and does not appear short of breath  Cardiovascular: No lower extremity edema, non tender, no erythema  Left hip shows positive impingement noted.  Anterior laterally more involved than medially.  Neurovascular intact distally.  No significant instability.  Nontender over the gluteal areas.    Impression and Recommendations:    The above documentation has been reviewed and is accurate and complete Nallely Yost M Laquan Beier, DO

## 2024-07-11 ENCOUNTER — Other Ambulatory Visit: Payer: Self-pay

## 2024-07-11 ENCOUNTER — Other Ambulatory Visit (HOSPITAL_COMMUNITY): Payer: Self-pay

## 2024-07-11 ENCOUNTER — Ambulatory Visit (INDEPENDENT_AMBULATORY_CARE_PROVIDER_SITE_OTHER): Admitting: Family Medicine

## 2024-07-11 ENCOUNTER — Other Ambulatory Visit: Payer: Self-pay | Admitting: Family Medicine

## 2024-07-11 VITALS — BP 128/88 | Temp 80.0°F | Ht 69.0 in | Wt 165.0 lb

## 2024-07-11 DIAGNOSIS — M25552 Pain in left hip: Secondary | ICD-10-CM | POA: Diagnosis not present

## 2024-07-11 MED ORDER — VITAMIN D (ERGOCALCIFEROL) 1.25 MG (50000 UNIT) PO CAPS
50000.0000 [IU] | ORAL_CAPSULE | ORAL | 0 refills | Status: DC
  Filled 2024-07-11: qty 12, 84d supply, fill #0

## 2024-07-11 MED ORDER — CELECOXIB 100 MG PO CAPS
100.0000 mg | ORAL_CAPSULE | Freq: Two times a day (BID) | ORAL | 0 refills | Status: DC
  Filled 2024-07-11 (×2): qty 180, 90d supply, fill #0

## 2024-07-11 NOTE — Patient Instructions (Signed)
 Celebrex  100mg  BID Update in 2 weeks See me again in 2 months

## 2024-07-11 NOTE — Assessment & Plan Note (Addendum)
 Seems to be more secondary to impingement noted.  Discussed different treatment options.  At this point I do think we can consider different medications.  Patient has had chronic kidney disease but we did discuss potentially taking anti-inflammatories and 5-day burst with 2 days off.  Will do Celebrex  to see if that would decrease the risk of any other kidney injuries.  Last GFR was significantly improved and above 60.  Feel that we are able to do this on a regular basis if necessary.  We also discussed the possibility of injections or even surgical intervention.  Patient wants to be active and hopefully this will allow her to do so.  If not we will discuss different treatment options.  Follow-up again in 2 months

## 2024-08-05 ENCOUNTER — Ambulatory Visit: Payer: Self-pay | Admitting: Internal Medicine

## 2024-08-05 ENCOUNTER — Encounter: Payer: Self-pay | Admitting: Internal Medicine

## 2024-08-05 ENCOUNTER — Other Ambulatory Visit (HOSPITAL_COMMUNITY): Payer: Self-pay

## 2024-08-05 ENCOUNTER — Other Ambulatory Visit: Payer: Self-pay

## 2024-08-05 ENCOUNTER — Ambulatory Visit: Admitting: Internal Medicine

## 2024-08-05 VITALS — BP 150/88 | HR 75 | Temp 97.6°F | Resp 16 | Ht 69.0 in | Wt 165.0 lb

## 2024-08-05 DIAGNOSIS — K219 Gastro-esophageal reflux disease without esophagitis: Secondary | ICD-10-CM

## 2024-08-05 DIAGNOSIS — Z0000 Encounter for general adult medical examination without abnormal findings: Secondary | ICD-10-CM | POA: Diagnosis not present

## 2024-08-05 DIAGNOSIS — Z23 Encounter for immunization: Secondary | ICD-10-CM | POA: Diagnosis not present

## 2024-08-05 DIAGNOSIS — N182 Chronic kidney disease, stage 2 (mild): Secondary | ICD-10-CM

## 2024-08-05 DIAGNOSIS — Z124 Encounter for screening for malignant neoplasm of cervix: Secondary | ICD-10-CM | POA: Insufficient documentation

## 2024-08-05 DIAGNOSIS — M8700 Idiopathic aseptic necrosis of unspecified bone: Secondary | ICD-10-CM

## 2024-08-05 DIAGNOSIS — K760 Fatty (change of) liver, not elsewhere classified: Secondary | ICD-10-CM

## 2024-08-05 DIAGNOSIS — Z0001 Encounter for general adult medical examination with abnormal findings: Secondary | ICD-10-CM

## 2024-08-05 DIAGNOSIS — D539 Nutritional anemia, unspecified: Secondary | ICD-10-CM

## 2024-08-05 DIAGNOSIS — I10 Essential (primary) hypertension: Secondary | ICD-10-CM

## 2024-08-05 DIAGNOSIS — E785 Hyperlipidemia, unspecified: Secondary | ICD-10-CM

## 2024-08-05 DIAGNOSIS — Z1231 Encounter for screening mammogram for malignant neoplasm of breast: Secondary | ICD-10-CM

## 2024-08-05 LAB — BASIC METABOLIC PANEL WITH GFR
BUN: 31 mg/dL — ABNORMAL HIGH (ref 6–23)
CO2: 29 meq/L (ref 19–32)
Calcium: 10.6 mg/dL — ABNORMAL HIGH (ref 8.4–10.5)
Chloride: 99 meq/L (ref 96–112)
Creatinine, Ser: 0.96 mg/dL (ref 0.40–1.20)
GFR: 62.25 mL/min (ref >60.00)
Glucose, Bld: 108 mg/dL — ABNORMAL HIGH (ref 70–99)
Potassium: 4.8 meq/L (ref 3.5–5.1)
Sodium: 138 meq/L (ref 135–145)

## 2024-08-05 LAB — URINALYSIS, ROUTINE W REFLEX MICROSCOPIC
Bilirubin Urine: NEGATIVE
Hgb urine dipstick: NEGATIVE
Ketones, ur: NEGATIVE
Nitrite: NEGATIVE
Specific Gravity, Urine: 1.025 (ref 1.000–1.030)
Total Protein, Urine: NEGATIVE
Urine Glucose: NEGATIVE
Urobilinogen, UA: 1 (ref 0.0–1.0)
pH: 6 (ref 5.0–8.0)

## 2024-08-05 LAB — LIPID PANEL
Cholesterol: 109 mg/dL (ref 0–200)
HDL: 71.6 mg/dL (ref >39.00)
LDL Cholesterol: 12 mg/dL (ref 0–99)
NonHDL: 36.93
Total CHOL/HDL Ratio: 2
Triglycerides: 127 mg/dL (ref 0.0–149.0)
VLDL: 25.4 mg/dL (ref 0.0–40.0)

## 2024-08-05 LAB — CBC WITH DIFFERENTIAL/PLATELET
Basophils Absolute: 0 K/uL (ref 0.0–0.1)
Basophils Relative: 0.9 % (ref 0.0–3.0)
Eosinophils Absolute: 0.1 K/uL (ref 0.0–0.7)
Eosinophils Relative: 1.9 % (ref 0.0–5.0)
HCT: 35.7 % — ABNORMAL LOW (ref 36.0–46.0)
Hemoglobin: 12.2 g/dL (ref 12.0–15.0)
Lymphocytes Relative: 34.4 % (ref 12.0–46.0)
Lymphs Abs: 1.9 K/uL (ref 0.7–4.0)
MCHC: 34 g/dL (ref 30.0–36.0)
MCV: 95.9 fl (ref 78.0–100.0)
Monocytes Absolute: 0.6 K/uL (ref 0.1–1.0)
Monocytes Relative: 10.4 % (ref 3.0–12.0)
Neutro Abs: 3 K/uL (ref 1.4–7.7)
Neutrophils Relative %: 52.4 % (ref 43.0–77.0)
Platelets: 232 K/uL (ref 150.0–400.0)
RBC: 3.73 Mil/uL — ABNORMAL LOW (ref 3.87–5.11)
RDW: 12 % (ref 11.5–15.5)
WBC: 5.7 K/uL (ref 4.0–10.5)

## 2024-08-05 LAB — IBC + FERRITIN
Ferritin: 90.9 ng/mL (ref 10.0–291.0)
Iron: 78 ug/dL (ref 42–145)
Saturation Ratios: 19.6 % — ABNORMAL LOW (ref 20.0–50.0)
TIBC: 397.6 ug/dL (ref 250.0–450.0)
Transferrin: 284 mg/dL (ref 212.0–360.0)

## 2024-08-05 LAB — FOLATE: Folate: >22.9 ng/mL (ref >5.9)

## 2024-08-05 LAB — HEPATIC FUNCTION PANEL
ALT: 17 U/L (ref 0–35)
AST: 26 U/L (ref 0–37)
Albumin: 5.1 g/dL (ref 3.5–5.2)
Alkaline Phosphatase: 54 U/L (ref 39–117)
Bilirubin, Direct: 0.1 mg/dL (ref 0.0–0.3)
Total Bilirubin: 0.5 mg/dL (ref 0.2–1.2)
Total Protein: 7.7 g/dL (ref 6.0–8.3)

## 2024-08-05 LAB — CK: Total CK: 78 U/L (ref 17–177)

## 2024-08-05 LAB — PROTIME-INR
INR: 1.1 ratio — ABNORMAL HIGH (ref 0.8–1.0)
Prothrombin Time: 12 s (ref 9.6–13.1)

## 2024-08-05 LAB — VITAMIN B12: Vitamin B-12: 570 pg/mL (ref 211–911)

## 2024-08-05 MED ORDER — AMLODIPINE BESYLATE 5 MG PO TABS
5.0000 mg | ORAL_TABLET | Freq: Every day | ORAL | 0 refills | Status: DC
  Filled 2024-08-05: qty 90, 90d supply, fill #0

## 2024-08-05 NOTE — Patient Instructions (Addendum)
 Chronic Kidney Disease in Adults: What to Know Chronic kidney disease (CKD) is when lasting damage happens to the kidneys slowly over time. The kidneys are two organs that do many important things in the body. These include: Taking waste and extra fluid out of the blood to make pee (urine). Making hormones. Keeping the right amount of fluids and chemicals in the body. A small amount of kidney damage may not cause problems. You must take steps to help keep the kidney damage from getting worse. A lot of damage may cause kidney failure. Kidney failure means the kidneys can no longer work right. What are the causes? Diabetes. High blood pressure. Diseases that affect the heart and blood vessels. Other kidney diseases. Diseases that affect the body's defense system (immune system). A problem with the flow of pee. This may be caused by: Kidney stones. Cancer. An enlarged prostate, in males. A kidney infection or urinary tract infection (UTI) that keeps coming back. What increases the risk? Getting older. The chances of having CKD increase with age. A family history of kidney disease or kidney failure. Having a disease caused by genes. Taking medicines that can harm the kidneys. Being near or having contact with harmful substances. Being very overweight. Using tobacco now or in the past. What are the signs or symptoms? Common symptoms of CKD include: Feeling very tired and having less energy. Swelling of the face, legs, ankles, or feet. Throwing up or feeling like you may throw up. Not wanting to eat as much as normal. Being confused or not able to focus. Twitches and cramps in the leg muscles or other muscles. Dry, itchy skin. Other symptoms may include: Shortness of breath. Trouble sleeping. Making less pee, or making more pee, especially at night. A taste of metal in your mouth. You may also become anemic. Anemia means there's not enough red blood cells in your blood. You may get  symptoms slowly. You may not notice them until the kidney damage gets very bad. How is this diagnosed? CKD may be diagnosed based on: Tests on your blood or pee. Imaging tests, like an ultrasound or a CT scan. A kidney biopsy. For this test, a sample of kidney tissue is removed to be looked at under a microscope. These tests will help to find out how serious the CKD is. How is this treated? Often, there's no cure for CKD. Treatment can help with symptoms and help keep the disease from getting worse. Treatment may include: Treating other problems that are causing your CKD or making it worse. Diet changes. You may need to: Avoid alcohol. Avoid foods that are high in salt, potassium, phosphorous, and protein. Taking medicines for symptoms and to help control other conditions. Dialysis. This treatment gets harmful waste out of your body. It may be needed if you have kidney failure. Follow these instructions at home: Medicines Take your medicines only as told. The amount of some medicines you take may need to be changed. Do not take any new medicines, vitamins, or supplements unless your health care provider says it's okay. These may make kidney damage worse. Lifestyle Do not smoke, vape, or use nicotine or tobacco. If you drink alcohol: Limit how much you have to: 0-1 drink a day if you're female. 0-2 drinks a day if you're female. Know how much alcohol is in your drink. In the [place] one drink is one 12 oz bottle of beer (355 mL), one 5 oz glass of wine (148 mL), or one 1 oz  glass of hard liquor (44 mL). Stay at a healthy weight. If you need help, ask your provider. General instructions  Eat and drink as told. Track your blood pressure at home. Tell your provider about any changes. If you have diabetes, track your blood sugar as told. Exercise at least 30 minutes a day, 5 days a week. Keep your shots (vaccinations) up to date. Keep all follow-up visits. Your provider may need to change  your treatments over time. Where to find support American Kidney Fund: [place].com.[employer] Kidney School: kidneyschool.org American Association of Kidney Patients: https://www.miller-montoya.com/ Where to find more information National Kidney Foundation: kidney.org Centers for Disease Control and Prevention. To learn more: Go to DiningCalendar.de. Click Search. Type chronic kidney disease in the search box. Contact a health care provider if: You have new symptoms. You get symptoms of end-stage kidney disease. These include: Headaches. Numbness in your hands or feet. Leg cramps. Easy bruising. Get help right away if: You have a fever. You make less pee than usual. You have pain or bleeding when you pee or poop. You have chest pain. You have shortness of breath. These symptoms may be an emergency. Call 911 right away. Do not wait to see if the symptoms will go away. Do not drive yourself to the hospital. This information is not intended to replace advice given to you by your health care provider. Make sure you discuss any questions you have with your health care provider. Document Revised: 09/12/2023 Document Reviewed: 05/05/2023 Elsevier Patient Education  2024 Elsevier Inc.    Health Maintenance, Female Adopting a healthy lifestyle and getting preventive care are important in promoting health and wellness. Ask your health care provider about: The right schedule for you to have regular tests and exams. Things you can do on your own to prevent diseases and keep yourself healthy. What should I know about diet, weight, and exercise? Eat a healthy diet  Eat a diet that includes plenty of vegetables, fruits, low-fat dairy products, and lean protein. Do not eat a lot of foods that are high in solid fats, added sugars, or sodium. Maintain a healthy weight Body mass index (BMI) is used to identify weight problems. It estimates body fat based on height and weight. Your health care provider can help determine your BMI  and help you achieve or maintain a healthy weight. Get regular exercise Get regular exercise. This is one of the most important things you can do for your health. Most adults should: Exercise for at least 150 minutes each week. The exercise should increase your heart rate and make you sweat (moderate-intensity exercise). Do strengthening exercises at least twice a week. This is in addition to the moderate-intensity exercise. Spend less time sitting. Even light physical activity can be beneficial. Watch cholesterol and blood lipids Have your blood tested for lipids and cholesterol at 66 years of age, then have this test every 5 years. Have your cholesterol levels checked more often if: Your lipid or cholesterol levels are high. You are older than 65 years of age. You are at high risk for heart disease. What should I know about cancer screening? Depending on your health history and family history, you may need to have cancer screening at various ages. This may include screening for: Breast cancer. Cervical cancer. Colorectal cancer. Skin cancer. Lung cancer. What should I know about heart disease, diabetes, and high blood pressure? Blood pressure and heart disease High blood pressure causes heart disease and increases the risk of stroke. This  is more likely to develop in people who have high blood pressure readings or are overweight. Have your blood pressure checked: Every 3-5 years if you are 68-97 years of age. Every year if you are 12 years old or older. Diabetes Have regular diabetes screenings. This checks your fasting blood sugar level. Have the screening done: Once every three years after age 101 if you are at a normal weight and have a low risk for diabetes. More often and at a younger age if you are overweight or have a high risk for diabetes. What should I know about preventing infection? Hepatitis B If you have a higher risk for hepatitis B, you should be screened for this  virus. Talk with your health care provider to find out if you are at risk for hepatitis B infection. Hepatitis C Testing is recommended for: Everyone born from [birth year] through 1967. Anyone with known risk factors for hepatitis C. Sexually transmitted infections (STIs) Get screened for STIs, including gonorrhea and chlamydia, if: You are sexually active and are younger than 65 years of age. You are older than 65 years of age and your health care provider tells you that you are at risk for this type of infection. Your sexual activity has changed since you were last screened, and you are at increased risk for chlamydia or gonorrhea. Ask your health care provider if you are at risk. Ask your health care provider about whether you are at high risk for HIV. Your health care provider may recommend a prescription medicine to help prevent HIV infection. If you choose to take medicine to prevent HIV, you should first get tested for HIV. You should then be tested every 3 months for as long as you are taking the medicine. Pregnancy If you are about to stop having your period (premenopausal) and you may become pregnant, seek counseling before you get pregnant. Take 400 to 800 micrograms (mcg) of folic acid  every day if you become pregnant. Ask for birth control (contraception) if you want to prevent pregnancy. Osteoporosis and menopause Osteoporosis is a disease in which the bones lose minerals and strength with aging. This can result in bone fractures. If you are 64 years old or older, or if you are at risk for osteoporosis and fractures, ask your health care provider if you should: Be screened for bone loss. Take a calcium  or vitamin D  supplement to lower your risk of fractures. Be given hormone replacement therapy (HRT) to treat symptoms of menopause. Follow these instructions at home: Alcohol use Do not drink alcohol if: Your health care provider tells you not to drink. You are pregnant, may be pregnant,  or are planning to become pregnant. If you drink alcohol: Limit how much you have to: 0-1 drink a day. Know how much alcohol is in your drink. In the [place] one drink equals one 12 oz bottle of beer (355 mL), one 5 oz glass of wine (148 mL), or one 1 oz glass of hard liquor (44 mL). Lifestyle Do not use any products that contain nicotine or tobacco. These products include cigarettes, chewing tobacco, and vaping devices, such as e-cigarettes. If you need help quitting, ask your health care provider. Do not use street drugs. Do not share needles. Ask your health care provider for help if you need support or information about quitting drugs. General instructions Schedule regular health, dental, and eye exams. Stay current with your vaccines. Tell your health care provider if: You often feel depressed. You have ever  been abused or do not feel safe at home. Summary Adopting a healthy lifestyle and getting preventive care are important in promoting health and wellness. Follow your health care provider's instructions about healthy diet, exercising, and getting tested or screened for diseases. Follow your health care provider's instructions on monitoring your cholesterol and blood pressure. This information is not intended to replace advice given to you by your health care provider. Make sure you discuss any questions you have with your health care provider. Document Revised: 03/22/2021 Document Reviewed: 03/22/2021 Elsevier Patient Education  2024 [employer].

## 2024-08-05 NOTE — Progress Notes (Signed)
 Subjective:  Patient ID: [name] [name], female    DOB: [birth year]  Age: 63 y.o. MRN: [id number]  CC: Annual Exam, Hypertension, Anemia, and Hyperlipidemia   HPI [name] [name] presents for a CPX and f/up  ---  Discussed the use of AI scribe software for clinical note transcription with the patient, who gave verbal consent to proceed.  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  She has ongoing issues with her hip and reports being told she has a labral tear after an MRI. She reports that her hip frequently 'pops out of socket,' causing significant pain and limiting her ability to exercise, including walking and using a rowing machine. She was started on Celebrex , which has provided some pain relief. She has not been able to maintain her previous exercise routine of walking for an hour five to six days a week since mid-June due to hip pain.  She experiences issues with her hand, specifically a finger that locks in place, suspected to be a trigger finger. This condition affects her ability to squeeze with that hand.  She has a history of irritable bowel syndrome (IBS), with symptoms alternating between diarrhea and constipation. Diarrhea is more common, but she experiences constipation when traveling. She uses a probiotic regularly and occasionally takes Miralax when traveling, which can lead to diarrhea. She experiences abdominal pain and cramping when symptoms are severe. No blood in her diarrhea.  Her current medications include Celebrex , carvedilol , depakote, olmesartan , pantoprazole , rosuvastatin , Praluent , and a vitamin D  supplement (50,000 units once a week). She is no longer taking prednisone .  She drinks wine most nights, averaging two to three glasses at home, but less when traveling. She has seen in her chart that she has chronic kidney disease, stage three, but has not discussed this  condition in detail previously.  No chest pain, shortness of breath, headaches, blurred vision, or swelling in her feet.     Outpatient Medications Prior to Visit  Medication Sig Dispense Refill   Alirocumab  (PRALUENT ) 150 MG/ML SOAJ Inject 1 mL (150 mg total) into the skin every 14 (fourteen) days. 6 mL 1   Calcium  Citrate-Vitamin D  315-250 MG-UNIT TABS Take 1 tablet by mouth 2 (two) times daily.     carvedilol  (COREG ) 3.125 MG tablet Take 1 tablet (3.125 mg total) by mouth 2 (two) times daily with a meal. 180 tablet 1   celecoxib  (CELEBREX ) 100 MG capsule Take 1 capsule (100 mg total) by mouth 2 (two) times daily. 180 capsule 0   MULTIPLE VITAMINS-MINERALS PO Take 1 tablet by mouth daily.     olmesartan  (BENICAR ) 20 MG tablet Take 1 tablet (20 mg total) by mouth daily. 90 tablet 1   Omega-3 Fatty Acids (FISH OIL) 1000 MG CAPS Take 1,000 mg by mouth daily.     pantoprazole  (PROTONIX ) 40 MG tablet Take 1 tablet (40 mg total) by mouth daily. 90 tablet 1   Probiotic Product (PROBIOTIC PO) Take by mouth daily.     rosuvastatin  (CRESTOR ) 5 MG tablet Take 1 tablet (5 mg total) by mouth daily. 90 tablet 1   Turmeric 500 MG CAPS Take 500 mg by mouth 2 (two) times daily.     valACYclovir  (VALTREX ) 1000 MG tablet Take 0.5 tablets (500 mg total) by mouth daily for supression. May take 2 tablets every morning and evening for 1 day as needed  90 tablet 4   Vitamin D , Ergocalciferol , (DRISDOL ) 1.25 MG (50000 UNIT) CAPS capsule Take 1 capsule (50,000 Units total) by mouth every 7 (seven) days. 12 capsule 0   indapamide  (LOZOL ) 1.25 MG tablet Take 1 tablet (1.25 mg total) by mouth daily. 90 tablet 1   predniSONE  (DELTASONE ) 20 MG tablet Take 1 tablet (20 mg total) by mouth daily with breakfast. 3 tablet 0   No facility-administered medications prior to visit.    ROS Review of Systems  Constitutional:  Negative for appetite change, chills, diaphoresis, fatigue and fever.  HENT: Negative.    Eyes:  Negative.   Respiratory: Negative.  Negative for cough, chest tightness, shortness of breath and wheezing.   Cardiovascular:  Negative for chest pain, palpitations and leg swelling.  Gastrointestinal:  Positive for constipation and diarrhea. Negative for abdominal pain, blood in stool, nausea and vomiting.  Endocrine: Negative.   Genitourinary: Negative.  Negative for difficulty urinating and dysuria.  Musculoskeletal:  Positive for arthralgias. Negative for joint swelling and myalgias.  Skin: Negative.   Neurological: Negative.  Negative for dizziness and weakness.  Hematological:  Negative for adenopathy. Does not bruise/bleed easily.  Psychiatric/Behavioral: Negative.      Objective:  BP (!) 150/88 (BP Location: Left Arm, Patient Position: Sitting, Cuff Size: Normal) Comment: BP (R) 146/86  Pulse 75   Temp 97.6 F (36.4 C) (Temporal)   Resp 16   Ht 5' 9 (1.753 m)   Wt 165 lb (74.8 kg)   SpO2 98%   BMI 24.37 kg/m   BP Readings from Last 3 Encounters:  08/05/24 (!) 150/88  07/11/24 128/88  05/23/24 122/80    Wt Readings from Last 3 Encounters:  08/05/24 165 lb (74.8 kg)  07/11/24 165 lb (74.8 kg)  05/23/24 161 lb (73 kg)    Physical Exam Constitutional:      Appearance: Normal appearance.  HENT:     Mouth/Throat:     Mouth: Mucous membranes are moist.  Eyes:     General: No scleral icterus.    Conjunctiva/sclera: Conjunctivae normal.  Cardiovascular:     Rate and Rhythm: Normal rate and regular rhythm.     Heart sounds: Normal heart sounds, S1 normal and S2 normal. No murmur heard.    No friction rub. No gallop.     Comments: EKG---- NSR, 68 bpm Low voltage TWI is not new No LVH or Q waves Pulmonary:     Effort: Pulmonary effort is normal.     Breath sounds: No stridor. No wheezing, rhonchi or rales.  Abdominal:     General: Abdomen is flat.     Palpations: There is no mass.     Tenderness: There is no abdominal tenderness. There is no guarding.      Hernia: No hernia is present.  Musculoskeletal:     Cervical back: Neck supple.     Right lower leg: No edema.     Left lower leg: No edema.  Lymphadenopathy:     Cervical: No cervical adenopathy.  Skin:    General: Skin is warm and dry.     Findings: No rash.  Neurological:     General: No focal deficit present.     Mental Status: She is alert.  Psychiatric:        Mood and Affect: Mood normal.        Behavior: Behavior normal.     Lab Results  Component Value Date   WBC 5.7 08/05/2024   HGB 12.2  08/05/2024   HCT 35.7 (L) 08/05/2024   PLT 232.0 08/05/2024   GLUCOSE 108 (H) 08/05/2024   CHOL 109 08/05/2024   TRIG 127.0 08/05/2024   HDL 71.60 08/05/2024   LDLDIRECT 146.0 11/21/2017   LDLCALC 12 08/05/2024   ALT 17 08/05/2024   AST 26 08/05/2024   NA 138 08/05/2024   K 4.8 08/05/2024   CL 99 08/05/2024   CREATININE 0.96 08/05/2024   BUN 31 (H) 08/05/2024   CO2 29 08/05/2024   TSH 2.23 02/12/2024   INR 1.1 (H) 08/05/2024   HGBA1C 5.1 12/18/2017    MR HIP LEFT W CONTRAST Result Date: 06/11/2024 CLINICAL DATA:  Chronic left hip pain. EXAM: MRI OF THE LEFT HIP WITH CONTRAST (MR Arthrogram) TECHNIQUE: Multiplanar, multisequence MR imaging of the hip was performed immediately following contrast injection into the hip joint under fluoroscopic guidance. No intravenous contrast was administered. COMPARISON:  Left hip radiographs dated 09/07/2023. FINDINGS: Bone No hip fracture, dislocation or avascular necrosis. No aggressive osseous lesion. 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 No SI joint widening, erosive changes, or subchondral edema. Degenerative changes of the visualized lower lumbar spine. Alignment Normal. No subluxation. Joint effusion Intraarticular contrast distends the right hip joint capsule. Otherwise, no joint effusions. Labrum No labral tear. Cartilage The articular cartilage is intact without appreciable focal defect. Mild superior joint space narrowing of  the left hip. Capsule and ligaments Normal. Muscles and Tendons Muscles demonstrate normal morphology and signal intensity. Mild tendinosis of the left gluteus minimus cuff insertion. Hamstring origin tendons are intact. Other Findings No bursal fluid. Viscera Descending and sigmoid colonic diverticulosis. No enlarged lymph nodes identified in the field of view. No free fluid in the pelvis. IMPRESSION: 1. No acute osseous abnormality. 2. Mild osteoarthritis of the left hip. No discrete labral tear identified. 3. Mild tendinosis of the left gluteus minimus cuff insertion. 4. 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 Electronically Signed   By: Harrietta Sherry M.D.   On: 06/11/2024 16:11   DG FLUORO GUIDED NEEDLE PLC ASPIRATION/INJECTION LOC Result Date: 06/11/2024 CLINICAL DATA:  Left hip pain. EXAM: LEFT HIP INJECTION FOR MRI FLUOROSCOPY: Radiation Exposure Index (as provided by the fluoroscopic device): 0.70 mGy Kerma PROCEDURE: The overlying skin was prepped with Betadine, draped in the usual sterile fashion, and infiltrated locally with 1% lidocaine . A 3.5 inch 22 gauge spinal needle was advanced to the mid to lateral aspect of the left femoral head-neck junction. 1 mL of 1% lidocaine  injected easily. A mixture of 0.1 mL of MultiHance , 15 mL of Isovue -M 200, and 5 mL of sterile saline was then used to opacify the left hip joint. 14 mL of this mixture were injected. The needle was removed and a bandage was applied. There was no immediate complication. IMPRESSION: Technically successful left hip injection under fluoroscopy for MR arthrogram. Electronically Signed   By: Dasie Hamburg M.D.   On: 06/11/2024 15:52    Fibrosis 4 Score = 1.77 (Indeterminate)       Interpretation for patients with NAFLD          <1.30       -  F0-F1 (Low risk)          1.30-2.67 -  Indeterminate           >2.67      -  F3-F4 (High risk)     Validated for ages 77-65         Assessment & Plan:  Need for  immunization  against influenza -     Flu vaccine HIGH DOSE PF(Fluzone Trivalent)  Primary hypertension- BP is not at goal. Will add amlodipine . -     EKG 12-Lead -     Basic metabolic panel with GFR; Future -     Urinalysis, Routine w reflex microscopic; Future -     amLODIPine  Besylate; Take 1 tablet (5 mg total) by mouth daily.  Dispense: 90 tablet; Refill: 0  Chronic renal disease, stage 2, mildly decreased glomerular filtration rate (GFR) between 60-89 mL/min/1.73 square meter  Avascular necrosis (HCC)- Continue celebrex .  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 -     IBC + Ferritin; Future -     Vitamin B12; Future -     Folate; Future -     Vitamin B1; Future -     Reticulocytes; Future  Cervical cancer screening -     Ambulatory referral to Gynecology     Follow-up: Return in about 3 months (around 11/04/2024).  Debby Molt, MD

## 2024-08-10 LAB — RETICULOCYTES
ABS Retic: 54750 {cells}/uL (ref 20000–80000)
Retic Ct Pct: 1.5 %

## 2024-08-10 LAB — VITAMIN B1: Vitamin B1 (Thiamine): 20 nmol/L (ref 8–30)

## 2024-08-11 DIAGNOSIS — Z0001 Encounter for general adult medical examination with abnormal findings: Secondary | ICD-10-CM | POA: Insufficient documentation

## 2024-08-11 NOTE — Assessment & Plan Note (Signed)
Exam completed, labs reviewed, vaccines reviewed and updated, cancer screenings addressed, pt ed material was given.

## 2024-08-21 ENCOUNTER — Other Ambulatory Visit: Payer: Self-pay | Admitting: Internal Medicine

## 2024-08-21 ENCOUNTER — Ambulatory Visit
Admission: RE | Admit: 2024-08-21 | Discharge: 2024-08-21 | Disposition: A | Discharge status: Home / Self Care | Source: Ambulatory Visit | Attending: Internal Medicine | Admitting: Internal Medicine

## 2024-08-21 ENCOUNTER — Other Ambulatory Visit (HOSPITAL_COMMUNITY): Payer: Self-pay

## 2024-08-21 DIAGNOSIS — Z1231 Encounter for screening mammogram for malignant neoplasm of breast: Secondary | ICD-10-CM | POA: Diagnosis not present

## 2024-08-21 DIAGNOSIS — R931 Abnormal findings on diagnostic imaging of heart and coronary circulation: Secondary | ICD-10-CM

## 2024-08-21 DIAGNOSIS — E785 Hyperlipidemia, unspecified: Secondary | ICD-10-CM

## 2024-08-21 MED ORDER — PRALUENT 150 MG/ML ~~LOC~~ SOAJ
150.0000 mg | SUBCUTANEOUS | 3 refills | Status: AC
  Filled 2024-08-21: qty 2, 28d supply, fill #0
  Filled 2024-09-16: qty 6, 84d supply, fill #1
  Filled 2024-12-01 – 2024-12-03 (×2): qty 6, 84d supply, fill #2

## 2024-08-22 ENCOUNTER — Other Ambulatory Visit: Payer: Self-pay

## 2024-08-22 ENCOUNTER — Other Ambulatory Visit (HOSPITAL_COMMUNITY): Payer: Self-pay

## 2024-08-22 ENCOUNTER — Encounter: Payer: Self-pay | Admitting: Pharmacist

## 2024-08-27 ENCOUNTER — Other Ambulatory Visit: Payer: Self-pay

## 2024-09-02 ENCOUNTER — Telehealth: Payer: Self-pay | Admitting: Pharmacy Technician

## 2024-09-02 ENCOUNTER — Other Ambulatory Visit (HOSPITAL_COMMUNITY): Payer: Self-pay

## 2024-09-02 NOTE — Telephone Encounter (Signed)
   Wouldn't let me do on latent. Faxed pa form

## 2024-09-03 ENCOUNTER — Other Ambulatory Visit (HOSPITAL_COMMUNITY): Payer: Self-pay

## 2024-09-03 NOTE — Telephone Encounter (Signed)
 Pharmacy Patient Advocate Encounter  Received notification from [facility] [employer] that Prior Authorization for praluent  has been APPROVED from 09/02/24 to 03/03/25   PA #/Case ID/Reference #: [id number]

## 2024-09-05 NOTE — Progress Notes (Signed)
 [facility] [address] [place] [address] Phone: [phone] Subjective:   [name] [name] am a scribe for Dr. Claudene.   I'm seeing this patient by the request  of:  Joshua Debby CROME, MD  CC: Left hip pain  YEP:Dlagzrupcz  07/11/2024 Seems to be more secondary to impingement noted.  Discussed different treatment options.  At this point I do think we can consider different medications.  Patient has had chronic kidney disease but we did discuss potentially taking anti-inflammatories and 5-day burst with 2 days off.  Will do Celebrex  to see if that would decrease the risk of any other kidney injuries.  Last GFR was significantly improved and above 60.  Feel that we are able to do this on a regular basis if necessary.  We also discussed the possibility of injections or even surgical intervention.  Patient wants to be active and hopefully this will allow her to do so.  If not we will discuss different treatment options.  Follow-up again in 2 months     Updated 09/11/2024 [name] [name] is a 63 y.o. female coming in with complaint of L hip pain, started Celebrex  100 mg twice a day.  Was to do home exercises. Patient states that she doesn't really have a home exercise program. She did get exercises from the office but she has not been doing them. The hip pain is not too bad today. It is always there but the pain varies. Dr. Joshua took her off of the Celebrex  a couple months ago.        Past Medical History:  Diagnosis Date   Bleeds easily    Cerebral aneurysm 1999   Surgically coiled   DDD (degenerative disc disease)    GERD (gastroesophageal reflux disease)    History of Barrett's esophagus    Hypercholesterolemia    Hypertension    Irritable bowel disease    Sinus disease    Past Surgical History:  Procedure Laterality Date   ABDOMINAL HYSTERECTOMY  2005   ANAL RECTAL MANOMETRY N/A 09/14/2020   Procedure: ANO RECTAL MANOMETRY;  Surgeon: Teresa Lonni HERO, MD;  Location: [facility];  Service: General;  Laterality: N/A;   APPENDECTOMY     ARTHROTOMY Right 12/11/2015   Procedure: OPEN ARTHROTOMY ;  Surgeon: Elsie Mussel, MD;  Location: [facility];  Service: Orthopedics;  Laterality: Right;   AUGMENTATION MAMMAPLASTY Bilateral    BREAST ENHANCEMENT SURGERY     CEREBRAL ANEURYSM REPAIR  1999   8 platinum coils   DIAGNOSTIC LAPAROSCOPY     KNEE ARTHROSCOPY Left 08/03/2021   Procedure: LEFT KNEE ARTHROSCOPY, PARTIAL MEDIAL MENISCECTOMY AND CONDROPLASTY;  Surgeon: Sheril Coy, MD;  Location: [facility];  Service: Orthopedics;  Laterality: Left;   LUMBAR DISC SURGERY Left 06/03/2010   Procedure: ANTEROLATERAL DECOMPRESSION AND ARTHRODESIS L3-4, L4-5   NASAL SINUS SURGERY  2013   ORIF ANKLE FRACTURE  03/01/2012   Procedure: OPEN REDUCTION INTERNAL FIXATION (ORIF) ANKLE FRACTURE;  Surgeon: Norleen Armor, MD;  Location: [facility];  Service: Orthopedics;  Laterality: Right;  Open reduction internal fixation right navicular fracture   REFRACTIVE SURGERY     SHOULDER ARTHROSCOPY Left    SYNOVECTOMY Right 12/11/2015   Procedure: SYNOVECTOMY DISTAL RADIOULNAR JOINT LOOSE BODY REMOVAL ;  Surgeon: Elsie Mussel, MD;  Location: [facility];  Service: Orthopedics;  Laterality: Right;   WRIST ARTHROSCOPY Right 12/11/2015   Procedure: RIGHT WRIST ARTHROSCOPY WITH  DEBRIDEMENT ;  Surgeon: Elsie Mussel, MD;  Location: [facility];  Service: Orthopedics;  Laterality: Right;   Social History   Socioeconomic History   Marital status: Divorced    Spouse name: Not on file   Number of children: Not on file   Years of education: Not on file   Highest education level: Associate degree: occupational, scientist, product/process development, or vocational program  Occupational History    Employer: [employer]  Tobacco Use   Smoking status: Former    Current packs/day: 0.00    Types: Cigarettes    Quit date: 11/14/1984     Years since quitting: 39.8   Smokeless tobacco: Never  Vaping Use   Vaping status: Never Used  Substance and Sexual Activity   Alcohol use: Yes    Alcohol/week: 24.0 standard drinks of alcohol    Types: 24 Glasses of wine per week   Drug use: No   Sexual activity: Not Currently    Partners: Male  Other Topics Concern   Not on file  Social History Narrative   The patient works as a engineer, 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 Her daughter and 39-month-old granddaughter live with her. She does not smoke cigarettes.   Social Drivers of Corporate Investment Banker Strain: Low Risk  (08/04/2024)   Overall Financial Resource Strain (CARDIA)    Difficulty of Paying Living Expenses: Not hard at all  Food Insecurity: No Food Insecurity (08/04/2024)   Hunger Vital Sign    Worried About Running Out of Food in the Last Year: Never true    Ran Out of Food in the Last Year: Never true  Transportation Needs: No Transportation Needs (08/04/2024)   PRAPARE - Administrator, Civil Service (Medical): No    Lack of Transportation (Non-Medical): No  Physical Activity: Insufficiently Active (08/04/2024)   Exercise Vital Sign    Days of Exercise per Week: 1 day    Minutes of Exercise per Session: 20 min  Stress: No Stress Concern Present (08/04/2024)   [employer] of Occupational Health - Occupational Stress Questionnaire    Feeling of Stress: Not at all  Social Connections: Socially Isolated (08/04/2024)   Social Connection and Isolation Panel    Frequency of Communication with Friends and Family: More than three times a week    Frequency of Social Gatherings with Friends and Family: Twice a week    Attends Religious Services: Patient declined    Database Administrator or Organizations: No    Attends Engineer, Structural: Not on file    Marital Status: Divorced   Allergies  Allergen Reactions   Hydrocodone-Acetaminophen Itching   Lipitor [Atorvastatin  Calcium ] Other (See  Comments)    Elevated liver enzymes   Morphine And Codeine Nausea Only   Tetracyclines & Related Rash    Break out on all extremties   Family History  Problem Relation Age of Onset   Hypertension Mother    Osteopenia Mother    Hypertension Father    Prostate cancer Father    Osteopenia Maternal Grandmother    Breast cancer Neg Hx      Current Outpatient Medications (Cardiovascular):    Alirocumab  (PRALUENT ) 150 MG/ML SOAJ, Inject 1 mL (150 mg total) into the skin every 14 (fourteen) days.   amLODipine  (NORVASC ) 5 MG tablet, Take 1 tablet (5 mg total) by mouth daily.   carvedilol  (COREG ) 3.125 MG tablet, Take 1 tablet (3.125 mg total) by mouth 2 (two) times daily with  a meal.   olmesartan  (BENICAR ) 20 MG tablet, Take 1 tablet (20 mg total) by mouth daily.   rosuvastatin  (CRESTOR ) 5 MG tablet, Take 1 tablet (5 mg total) by mouth daily.   Current Outpatient Medications (Analgesics):    celecoxib  (CELEBREX ) 100 MG capsule, Take 1 capsule (100 mg total) by mouth 2 (two) times daily.   Current Outpatient Medications (Other):    Calcium  Citrate-Vitamin D  315-250 MG-UNIT TABS, Take 1 tablet by mouth 2 (two) times daily.   MULTIPLE VITAMINS-MINERALS PO, Take 1 tablet by mouth daily.   Omega-3 Fatty Acids (FISH OIL) 1000 MG CAPS, Take 1,000 mg by mouth daily.   pantoprazole  (PROTONIX ) 40 MG tablet, Take 1 tablet (40 mg total) by mouth daily.   Probiotic Product (PROBIOTIC PO), Take by mouth daily.   Turmeric 500 MG CAPS, Take 500 mg by mouth 2 (two) times daily.   valACYclovir  (VALTREX ) 1000 MG tablet, Take 0.5 tablets (500 mg total) by mouth daily for supression. May take 2 tablets every morning and evening for 1 day as needed   Vitamin D , Ergocalciferol , (DRISDOL ) 1.25 MG (50000 UNIT) CAPS capsule, Take 1 capsule (50,000 Units total) by mouth every 7 (seven) days.   Reviewed prior external information including notes and imaging from  primary care provider As well as notes that were  available from care everywhere and other healthcare systems.  Past medical history, social, surgical and family history all reviewed in electronic medical record.  No pertanent information unless stated regarding to the chief complaint.   Review of Systems:  No headache, visual changes, nausea, vomiting, diarrhea, constipation, dizziness, abdominal pain, skin rash, fevers, chills, night sweats, weight loss, swollen lymph nodes, body aches, joint swelling, chest pain, shortness of breath, mood changes. POSITIVE muscle aches  Objective  Blood pressure 122/80, pulse 98, height 5' 9 (1.753 m), weight 169 lb 6.4 oz (76.8 kg), SpO2 92%.   General: No apparent distress alert and oriented x3 mood and affect normal, dressed appropriately.  HEENT: Pupils equal, extraocular movements intact  Respiratory: Patient's speak in full sentences and does not appear short of breath  Cardiovascular: No lower extremity edema, non tender, no erythema  Left hip does have some tenderness to palpation noted over the joint space itself.  Positive impingement with internal range of motion that is decreased secondary to pain and does seem to have a hard stop.  Negative straight leg test noted. Hand exam does have a triggering of the finger of the ring finger noted.  Patient is tender to palpation over the A2 pulley severely.  Only this finger.  Limited muscular skeletal ultrasound was performed and interpreted by CLAUDENE HUSSAR, M  Limited ultrasound shows hypoechoic changes consistent with a trigger nodule noted at the A2 pulley.  Does consistently communicate with the tendon sheath. Impression: Trigger nodule of the right fourth flexor tendon sheath   Impression and Recommendations:     The above documentation has been reviewed and is accurate and complete Talor Cheema M Annitta Fifield, DO

## 2024-09-11 ENCOUNTER — Ambulatory Visit: Admitting: Family Medicine

## 2024-09-11 ENCOUNTER — Encounter: Payer: Self-pay | Admitting: Family Medicine

## 2024-09-11 ENCOUNTER — Other Ambulatory Visit: Payer: Self-pay

## 2024-09-11 VITALS — BP 122/80 | HR 98 | Ht 69.0 in | Wt 169.4 lb

## 2024-09-11 DIAGNOSIS — M65341 Trigger finger, right ring finger: Secondary | ICD-10-CM | POA: Insufficient documentation

## 2024-09-11 DIAGNOSIS — M25552 Pain in left hip: Secondary | ICD-10-CM | POA: Diagnosis not present

## 2024-09-11 DIAGNOSIS — M25852 Other specified joint disorders, left hip: Secondary | ICD-10-CM | POA: Insufficient documentation

## 2024-09-11 DIAGNOSIS — R5383 Other fatigue: Secondary | ICD-10-CM

## 2024-09-11 LAB — T3, FREE: T3, Free: 3.7 pg/mL (ref 2.3–4.2)

## 2024-09-11 LAB — T4, FREE: Free T4: 0.76 ng/dL (ref 0.60–1.60)

## 2024-09-11 LAB — VITAMIN D 25 HYDROXY (VIT D DEFICIENCY, FRACTURES): VITD: 87.41 ng/mL (ref 30.00–100.00)

## 2024-09-11 NOTE — Patient Instructions (Signed)
 Great to see you  Frog splint at night Heat and massage area 2 times daily for 5 minutes  See Alusio and see what he has to say  Have an appointment after seeing ortho and we will discuss

## 2024-09-11 NOTE — Assessment & Plan Note (Signed)
 Significant lateral impingement of the hip noted.  Patient is going to follow-up with orthopedics to discuss further treatment options.  We did discuss the possibility of injection to help with diagnosis aspect.  Patient wanted to hold on that and would like to see what else needs to be done first.  Follow-up with me again 6 to 8 weeks otherwise.

## 2024-09-11 NOTE — Assessment & Plan Note (Signed)
 Discussed with patient about different treatment options.  Patient has elected to try bracing as well as a warm massage.  Follow-up with me again in 2 to 3 months.  Worsening pain consider injection

## 2024-09-12 LAB — CALCIUM, IONIZED: Calcium, Ion: 5.3 mg/dL (ref 4.7–5.5)

## 2024-09-13 LAB — PTH, INTACT AND CALCIUM
Calcium: 9.9 mg/dL (ref 8.6–10.4)
PTH: 26 pg/mL (ref 16–77)

## 2024-09-16 ENCOUNTER — Other Ambulatory Visit (HOSPITAL_COMMUNITY): Payer: Self-pay

## 2024-09-16 ENCOUNTER — Other Ambulatory Visit: Payer: Self-pay | Admitting: Internal Medicine

## 2024-09-16 DIAGNOSIS — I10 Essential (primary) hypertension: Secondary | ICD-10-CM

## 2024-09-16 DIAGNOSIS — K219 Gastro-esophageal reflux disease without esophagitis: Secondary | ICD-10-CM

## 2024-09-17 ENCOUNTER — Other Ambulatory Visit (HOSPITAL_COMMUNITY): Payer: Self-pay

## 2024-09-17 ENCOUNTER — Other Ambulatory Visit: Payer: Self-pay

## 2024-09-17 MED ORDER — CARVEDILOL 3.125 MG PO TABS
3.1250 mg | ORAL_TABLET | Freq: Two times a day (BID) | ORAL | 1 refills | Status: AC
  Filled 2024-09-17: qty 180, 90d supply, fill #0
  Filled 2024-12-01: qty 180, 90d supply, fill #1

## 2024-09-17 MED ORDER — OLMESARTAN MEDOXOMIL 20 MG PO TABS
20.0000 mg | ORAL_TABLET | Freq: Every day | ORAL | 1 refills | Status: AC
  Filled 2024-09-17: qty 90, 90d supply, fill #0
  Filled 2024-12-01: qty 90, 90d supply, fill #1

## 2024-09-17 MED ORDER — PANTOPRAZOLE SODIUM 40 MG PO TBEC
40.0000 mg | DELAYED_RELEASE_TABLET | Freq: Every day | ORAL | 1 refills | Status: AC
  Filled 2024-09-17: qty 90, 90d supply, fill #0
  Filled 2024-12-01: qty 90, 90d supply, fill #1

## 2024-09-29 ENCOUNTER — Other Ambulatory Visit: Payer: Self-pay | Admitting: Family Medicine

## 2024-09-30 ENCOUNTER — Other Ambulatory Visit (HOSPITAL_COMMUNITY): Payer: Self-pay

## 2024-09-30 MED ORDER — VITAMIN D (ERGOCALCIFEROL) 1.25 MG (50000 UNIT) PO CAPS
50000.0000 [IU] | ORAL_CAPSULE | ORAL | 0 refills | Status: AC
  Filled 2024-09-30: qty 12, 84d supply, fill #0

## 2024-10-17 DIAGNOSIS — M25852 Other specified joint disorders, left hip: Secondary | ICD-10-CM | POA: Diagnosis not present

## 2024-10-17 DIAGNOSIS — M25552 Pain in left hip: Secondary | ICD-10-CM | POA: Diagnosis not present

## 2024-10-18 DIAGNOSIS — M25852 Other specified joint disorders, left hip: Secondary | ICD-10-CM | POA: Insufficient documentation

## 2024-10-22 ENCOUNTER — Other Ambulatory Visit: Payer: Self-pay

## 2024-10-22 ENCOUNTER — Other Ambulatory Visit: Payer: Self-pay | Admitting: Internal Medicine

## 2024-10-22 DIAGNOSIS — I10 Essential (primary) hypertension: Secondary | ICD-10-CM

## 2024-10-22 MED ORDER — AMLODIPINE BESYLATE 5 MG PO TABS
5.0000 mg | ORAL_TABLET | Freq: Every day | ORAL | 0 refills | Status: AC
  Filled 2024-10-22: qty 90, 90d supply, fill #0

## 2024-10-24 ENCOUNTER — Ambulatory Visit: Admitting: Family Medicine

## 2024-11-11 ENCOUNTER — Ambulatory Visit: Payer: Self-pay | Admitting: Internal Medicine

## 2024-11-11 ENCOUNTER — Ambulatory Visit (INDEPENDENT_AMBULATORY_CARE_PROVIDER_SITE_OTHER)

## 2024-11-11 ENCOUNTER — Encounter: Payer: Self-pay | Admitting: Internal Medicine

## 2024-11-11 ENCOUNTER — Ambulatory Visit (INDEPENDENT_AMBULATORY_CARE_PROVIDER_SITE_OTHER): Admitting: Internal Medicine

## 2024-11-11 ENCOUNTER — Other Ambulatory Visit (HOSPITAL_COMMUNITY): Payer: Self-pay

## 2024-11-11 VITALS — BP 138/92 | HR 75 | Temp 97.9°F | Resp 16 | Ht 69.0 in | Wt 169.8 lb

## 2024-11-11 DIAGNOSIS — R0789 Other chest pain: Secondary | ICD-10-CM | POA: Insufficient documentation

## 2024-11-11 DIAGNOSIS — K219 Gastro-esophageal reflux disease without esophagitis: Secondary | ICD-10-CM | POA: Diagnosis not present

## 2024-11-11 DIAGNOSIS — I10 Essential (primary) hypertension: Secondary | ICD-10-CM

## 2024-11-11 LAB — URINALYSIS, ROUTINE W REFLEX MICROSCOPIC
Bilirubin Urine: NEGATIVE
Hgb urine dipstick: NEGATIVE
Ketones, ur: NEGATIVE
Leukocytes,Ua: NEGATIVE
Nitrite: NEGATIVE
Specific Gravity, Urine: 1.025 (ref 1.000–1.030)
Total Protein, Urine: NEGATIVE
Urine Glucose: NEGATIVE
Urobilinogen, UA: 1 (ref 0.0–1.0)
pH: 6 (ref 5.0–8.0)

## 2024-11-11 LAB — CBC WITH DIFFERENTIAL/PLATELET
Basophils Absolute: 0.1 K/uL (ref 0.0–0.1)
Basophils Relative: 0.9 % (ref 0.0–3.0)
Eosinophils Absolute: 0.1 K/uL (ref 0.0–0.7)
Eosinophils Relative: 2.1 % (ref 0.0–5.0)
HCT: 36.8 % (ref 36.0–46.0)
Hemoglobin: 12.3 g/dL (ref 12.0–15.0)
Lymphocytes Relative: 28 % (ref 12.0–46.0)
Lymphs Abs: 1.9 K/uL (ref 0.7–4.0)
MCHC: 33.5 g/dL (ref 30.0–36.0)
MCV: 96.4 fl (ref 78.0–100.0)
Monocytes Absolute: 0.7 K/uL (ref 0.1–1.0)
Monocytes Relative: 10.2 % (ref 3.0–12.0)
Neutro Abs: 4 K/uL (ref 1.4–7.7)
Neutrophils Relative %: 58.8 % (ref 43.0–77.0)
Platelets: 270 K/uL (ref 150.0–400.0)
RBC: 3.82 Mil/uL — ABNORMAL LOW (ref 3.87–5.11)
RDW: 12.5 % (ref 11.5–15.5)
WBC: 6.8 K/uL (ref 4.0–10.5)

## 2024-11-11 LAB — BASIC METABOLIC PANEL WITH GFR
BUN: 19 mg/dL (ref 6–23)
CO2: 28 meq/L (ref 19–32)
Calcium: 9.8 mg/dL (ref 8.4–10.5)
Chloride: 98 meq/L (ref 96–112)
Creatinine, Ser: 0.69 mg/dL (ref 0.40–1.20)
GFR: 91.08 mL/min
Glucose, Bld: 102 mg/dL — ABNORMAL HIGH (ref 70–99)
Potassium: 3.7 meq/L (ref 3.5–5.1)
Sodium: 137 meq/L (ref 135–145)

## 2024-11-11 LAB — TSH: TSH: 3.43 u[IU]/mL (ref 0.35–5.50)

## 2024-11-11 MED ORDER — TORSEMIDE 10 MG PO TABS
10.0000 mg | ORAL_TABLET | Freq: Every day | ORAL | 1 refills | Status: AC
  Filled 2024-11-11: qty 60, 60d supply, fill #0
  Filled 2024-11-11: qty 30, 30d supply, fill #0
  Filled 2024-11-11: qty 90, 90d supply, fill #0

## 2024-11-11 MED ORDER — TRAMADOL HCL 50 MG PO TABS
50.0000 mg | ORAL_TABLET | Freq: Four times a day (QID) | ORAL | 0 refills | Status: AC | PRN
  Filled 2024-11-11 (×2): qty 25, 7d supply, fill #0

## 2024-11-11 NOTE — Patient Instructions (Signed)
 Chest Wall Pain Chest wall pain is pain in or around the bones and muscles of your chest. Sometimes, an injury causes this pain. Excessive coughing or overuse of arm and chest muscles may also cause chest wall pain. Sometimes, the cause may not be known. This pain may take several weeks or longer to get better. Follow these instructions at home: Managing pain, stiffness, and swelling  If directed, put ice on the painful area: Put ice in a plastic bag. Place a towel between your skin and the bag. Leave the ice on for 20 minutes, 2-3 times per day. Activity Rest as told by your health care provider. Avoid activities that cause pain. These include any activities that use your chest muscles or your abdominal and side muscles to lift heavy items. Ask your health care provider what activities are safe for you. General instructions  Take over-the-counter and prescription medicines only as told by your health care provider. Do not use any products that contain nicotine or tobacco, such as cigarettes, e-cigarettes, and chewing tobacco. These can delay healing after injury. If you need help quitting, ask your health care provider. Keep all follow-up visits as told by your health care provider. This is important. Contact a health care provider if: You have a fever. Your chest pain becomes worse. You have new symptoms. Get help right away if: You have nausea or vomiting. You feel sweaty or light-headed. You have a cough with mucus from your lungs (sputum) or you cough up blood. You develop shortness of breath. These symptoms may represent a serious problem that is an emergency. Do not wait to see if the symptoms will go away. Get medical help right away. Call your local emergency services (911 in the [place] Do not drive yourself to the hospital. Summary Chest wall pain is pain in or around the bones and muscles of your chest. Depending on the cause, it may be treated with ice, rest, medicines, and  avoiding activities that cause pain. Contact a health care provider if you have a fever, worsening chest pain, or new symptoms. Get help right away if you feel light-headed or you develop shortness of breath. These symptoms may be an emergency. This information is not intended to replace advice given to you by your health care provider. Make sure you discuss any questions you have with your health care provider. Document Revised: 10/24/2022 Document Reviewed: 10/24/2022 Elsevier Patient Education  2024 [employer].

## 2024-11-11 NOTE — Progress Notes (Unsigned)
 "  Subjective:  Patient ID: [name] [name], female    DOB: 11-19-1960  Age: 63 y.o. MRN: [id number]  CC: Hypertension   HPI [name] [name] presents for f/up ----  Discussed the use of AI scribe software for clinical note transcription with the patient, who gave verbal consent to proceed.  History of Present Illness [name] [name] is a 63 year old female who presents with left-sided rib pain following a fall.  She tripped over her treadmill cord and landed on a fan on November 05, 2024, nearly a week ago, resulting in significant left-sided rib pain. The pain is severe and is accompanied by noticeable bruising that has started to turn brown.  Initially, she managed the pain with Tylenol and Advil, which provided some relief. After running out of Tylenol, she began taking expired tramadol , prescribed as 50 mg tablets every six hours, but she has only been taking one tablet at a time. Despite this, the pain persists.  No shortness of breath or difficulty breathing, although she finds it hard to take a deep breath. No cardiac symptoms such as CP or shortness of breath during treadmill use.  She has noticed swelling in her L ankle since the end of September. She has gained approximately 5 pounds over the last several months.     Outpatient Medications Prior to Visit  Medication Sig Dispense Refill   Alirocumab  (PRALUENT ) 150 MG/ML SOAJ Inject 1 mL (150 mg total) into the skin every 14 (fourteen) days. 6 mL 3   amLODipine  (NORVASC ) 5 MG tablet Take 1 tablet (5 mg total) by mouth daily. 90 tablet 0   Calcium  Citrate-Vitamin D  315-250 MG-UNIT TABS Take 1 tablet by mouth 2 (two) times daily.     carvedilol  (COREG ) 3.125 MG tablet Take 1 tablet (3.125 mg total) by mouth 2 (two) times daily with a meal. 180 tablet 1   MULTIPLE VITAMINS-MINERALS PO Take 1 tablet by mouth daily.     olmesartan  (BENICAR ) 20 MG tablet Take 1 tablet (20 mg total) by mouth daily. 90 tablet 1   Omega-3 Fatty Acids (FISH  OIL) 1000 MG CAPS Take 1,000 mg by mouth daily.     pantoprazole  (PROTONIX ) 40 MG tablet Take 1 tablet (40 mg total) by mouth daily. 90 tablet 1   Probiotic Product (PROBIOTIC PO) Take by mouth daily.     rosuvastatin  (CRESTOR ) 5 MG tablet Take 1 tablet (5 mg total) by mouth daily. 90 tablet 1   Turmeric 500 MG CAPS Take 500 mg by mouth 2 (two) times daily.     valACYclovir  (VALTREX ) 1000 MG tablet Take 0.5 tablets (500 mg total) by mouth daily for supression. May take 2 tablets every morning and evening for 1 day as needed 90 tablet 4   Vitamin D , Ergocalciferol , (DRISDOL ) 1.25 MG (50000 UNIT) CAPS capsule Take 1 capsule (50,000 Units total) by mouth every 7 (seven) days. 12 capsule 0   celecoxib  (CELEBREX ) 100 MG capsule Take 1 capsule (100 mg total) by mouth 2 (two) times daily. 180 capsule 0   No facility-administered medications prior to visit.    ROS Review of Systems  Objective:  BP 130/86 (BP Location: Left Arm, Patient Position: Sitting, Cuff Size: Normal)   Pulse 75   Temp 97.9 F (36.6 C) (Oral)   Resp 16   Ht 5' 9 (1.753 m)   Wt 169 lb 12.8 oz (77 kg)   SpO2 94%   BMI 25.08 kg/m   BP Readings  from Last 3 Encounters:  11/11/24 130/86  09/11/24 122/80  08/05/24 (!) 150/88    Wt Readings from Last 3 Encounters:  11/11/24 169 lb 12.8 oz (77 kg)  09/11/24 169 lb 6.4 oz (76.8 kg)  08/05/24 165 lb (74.8 kg)    Physical Exam Vitals reviewed.  Constitutional:      Appearance: Normal appearance.  HENT:     Mouth/Throat:     Pharynx: Oropharynx is clear.  Eyes:     General: No scleral icterus.    Conjunctiva/sclera: Conjunctivae normal.  Cardiovascular:     Rate and Rhythm: Normal rate and regular rhythm.     Heart sounds: No murmur heard.    No friction rub. No gallop.     Comments: Trace L ankle edema Pulmonary:     Effort: Pulmonary effort is normal. No respiratory distress.     Breath sounds: Examination of the right-lower field reveals rales. Examination  of the left-lower field reveals rales. Rales present. No decreased breath sounds, wheezing or rhonchi.  Chest:     Chest wall: Tenderness present. No mass, lacerations, deformity, swelling, crepitus or edema.  Abdominal:     General: Abdomen is flat. Bowel sounds are normal.     Palpations: There is no mass.     Tenderness: There is no abdominal tenderness. There is no guarding.     Hernia: No hernia is present.  Musculoskeletal:        General: Normal range of motion.     Cervical back: Neck supple.     Right lower leg: No edema.     Left lower leg: No edema.  Lymphadenopathy:     Cervical: No cervical adenopathy.  Skin:    Coloration: Skin is not jaundiced.     Findings: Bruising present. No rash.  Neurological:     General: No focal deficit present.     Mental Status: She is alert.  Psychiatric:        Mood and Affect: Mood normal.        Behavior: Behavior normal.     Lab Results  Component Value Date   WBC 5.7 08/05/2024   HGB 12.2 08/05/2024   HCT 35.7 (L) 08/05/2024   PLT 232.0 08/05/2024   GLUCOSE 108 (H) 08/05/2024   CHOL 109 08/05/2024   TRIG 127.0 08/05/2024   HDL 71.60 08/05/2024   LDLDIRECT 146.0 11/21/2017   LDLCALC 12 08/05/2024   ALT 17 08/05/2024   AST 26 08/05/2024   NA 138 08/05/2024   K 4.8 08/05/2024   CL 99 08/05/2024   CREATININE 0.96 08/05/2024   BUN 31 (H) 08/05/2024   CO2 29 08/05/2024   TSH 2.23 02/12/2024   INR 1.1 (H) 08/05/2024   HGBA1C 5.1 12/18/2017    MM 3D SCREENING MAMMOGRAM BILATERAL BREAST W/IMPLANT Result Date: 08/23/2024 CLINICAL DATA:  Screening. EXAM: DIGITAL SCREENING BILATERAL MAMMOGRAM WITH IMPLANTS, CAD AND TOMOSYNTHESIS TECHNIQUE: Bilateral screening digital craniocaudal and mediolateral oblique mammograms were obtained. Bilateral screening digital breast tomosynthesis was performed. The images were evaluated with computer-aided detection. Standard and/or implant displaced views were performed. COMPARISON:  Previous  exam(s). ACR Breast Density Category b: There are scattered areas of fibroglandular density. FINDINGS: The patient has retroglandular implants. There are no findings suspicious for malignancy. IMPRESSION: No mammographic evidence of malignancy. A result letter of this screening mammogram will be mailed directly to the patient. RECOMMENDATION: Screening mammogram in one year. (Code:SM-B-01Y) BI-RADS CATEGORY  1: Negative. Electronically Signed   By: Harrie  Arceo M.D.   On: 08/23/2024 10:54   DG Ribs Unilateral Left Result Date: 11/11/2024 EXAM: 1 VIEW XRAY OF THE LEFT RIBS 11/11/2024 11:41:23 AM COMPARISON: Chest x-ray today and CT from 04/02/2018. CLINICAL HISTORY: Pain after a fall. FINDINGS: BONES: Fracture through the anterior left 6th rib. Chronic posterior left 8th rib fracture. Age indeterminate but acute-appearing posterior left 9th rib fracture. LUNGS AND PLEURA: Left base atelectasis and possible small left effusion. No pneumothorax. IMPRESSION: 1. Acute-appearing anterior left 6th rib fracture and posterior left 9th rib fracture. 2. Left base atelectasis and possible small left effusion. No pneumothorax. Electronically signed by: Franky Crease MD 11/11/2024 01:20 PM EST RP Workstation: [id number]   DG Chest 2 View Result Date: 11/11/2024 EXAM: 2 VIEW(S) XRAY OF THE CHEST 11/11/2024 11:41:23 AM COMPARISON: 05/28/2010. CLINICAL HISTORY: pain after a fall FINDINGS: LUNGS AND PLEURA: Left base atelectasis. Small left pleural effusion suspected. No pneumothorax. No focal pulmonary opacity. HEART AND MEDIASTINUM: No acute abnormality of the cardiac and mediastinal silhouettes. BONES AND SOFT TISSUES: Posterior left 8th and 9th rib fractures noted. The 8th rib fracture appears chronic but is new since 2011. A rib fracture in the posterior left 8th rib was noted on prior CT from 2019. There was not a posterior left 9th rib fracture; therefore, the 9th rib fracture is age indeterminate and could be acute.  IMPRESSION: 1. Posterior left 8th and 9th rib fractures, with chronic-appearing 8th rib fracture and age-indeterminate 9th rib fracture that could be acute. 2. Left basilar atelectasis and suspected small left pleural effusion, without pneumothorax. Electronically signed by: Franky Crease MD 11/11/2024 01:18 PM EST RP Workstation: [id number]    The ASCVD Risk score (Arnett DK, et al., 2019) failed to calculate for the following reasons:   The valid total cholesterol range is 130 to 320 mg/dL   Assessment & Plan:  Gastroesophageal reflux disease, unspecified whether esophagitis present -     Basic metabolic panel with GFR; Future -     CBC with Differential/Platelet; Future  Primary hypertension -     Basic metabolic panel with GFR; Future -     CBC with Differential/Platelet; Future -     TSH; Future -     Urinalysis, Routine w reflex microscopic; Future -     Torsemide; Take 1 tablet (10 mg total) by mouth daily.  Dispense: 90 tablet; Refill: 1  Left-sided chest wall pain -     DG Chest 2 View; Future -     DG Ribs Unilateral Left; Future -     traMADol  HCl; Take 1 tablet (50 mg total) by mouth every 6 (six) hours as needed for up to 7 days.  Dispense: 25 tablet; Refill: 0     Follow-up: Return if symptoms worsen or fail to improve.  Debby Molt, MD "

## 2024-11-21 ENCOUNTER — Other Ambulatory Visit: Payer: Self-pay | Admitting: Internal Medicine

## 2024-11-21 DIAGNOSIS — E785 Hyperlipidemia, unspecified: Secondary | ICD-10-CM

## 2024-11-22 ENCOUNTER — Other Ambulatory Visit: Payer: Self-pay

## 2024-11-22 ENCOUNTER — Other Ambulatory Visit (HOSPITAL_COMMUNITY): Payer: Self-pay

## 2024-11-22 MED ORDER — ROSUVASTATIN CALCIUM 5 MG PO TABS
5.0000 mg | ORAL_TABLET | Freq: Every day | ORAL | 1 refills | Status: AC
  Filled 2024-11-22: qty 90, 90d supply, fill #0

## 2024-12-01 ENCOUNTER — Other Ambulatory Visit (HOSPITAL_COMMUNITY): Payer: Self-pay

## 2024-12-02 ENCOUNTER — Telehealth: Payer: Self-pay | Admitting: Pharmacy Technician

## 2024-12-02 ENCOUNTER — Other Ambulatory Visit (HOSPITAL_COMMUNITY): Payer: Self-pay

## 2024-12-02 ENCOUNTER — Other Ambulatory Visit: Payer: Self-pay

## 2024-12-02 NOTE — Telephone Encounter (Signed)
 Hi, can you fill this on ins and hw please?      Tier exception was denied for praluent   Submitted [employer] grant for praluent   Patient Advocate Encounter   The patient was approved for a [employer] grant that will help cover the cost of praluent  Total amount awarded, 2500.  Effective: 11/02/24 - 11/01/25   APW:[id number] ERW:[id number] Hmnle:[id number] PI:[id number] [employer] ID: [id number]   Pharmacy provided with approval and processing information. Patient informed via mychart

## 2024-12-03 ENCOUNTER — Other Ambulatory Visit (HOSPITAL_COMMUNITY): Payer: Self-pay

## 2025-03-07 ENCOUNTER — Ambulatory Visit: Admitting: Internal Medicine

## 2025-04-14 ENCOUNTER — Ambulatory Visit: Admitting: Internal Medicine
